# Patient Record
Sex: Male | Born: 1945 | Race: White | Hispanic: No | Smoking: Former smoker
Health system: Southern US, Community
[De-identification: ages and names within clinical notes are randomized; demographics above are authoritative.]

## PROBLEM LIST (undated history)

## (undated) ENCOUNTER — Emergency Department (HOSPITAL_COMMUNITY): Admission: EM | Payer: Medicare Other | Source: Home / Self Care

## (undated) DIAGNOSIS — I2510 Atherosclerotic heart disease of native coronary artery without angina pectoris: Secondary | ICD-10-CM

## (undated) DIAGNOSIS — I509 Heart failure, unspecified: Secondary | ICD-10-CM

## (undated) HISTORY — PX: NOSE SURGERY: SHX723

## (undated) HISTORY — PX: CARDIAC CATHETERIZATION: SHX172

---

## 2019-09-18 ENCOUNTER — Emergency Department (HOSPITAL_COMMUNITY): Payer: Medicare Other

## 2019-09-18 ENCOUNTER — Inpatient Hospital Stay (HOSPITAL_COMMUNITY)
Admission: EM | Admit: 2019-09-18 | Discharge: 2019-10-01 | DRG: 215 | Disposition: A | Discharge status: Home / Self Care | Payer: Medicare Other | Attending: Cardiothoracic Surgery | Admitting: Cardiothoracic Surgery

## 2019-09-18 ENCOUNTER — Encounter (HOSPITAL_COMMUNITY)
Admission: EM | Disposition: A | Discharge status: Home / Self Care | Payer: Self-pay | Source: Home / Self Care | Attending: Cardiothoracic Surgery

## 2019-09-18 ENCOUNTER — Ambulatory Visit (INDEPENDENT_AMBULATORY_CARE_PROVIDER_SITE_OTHER)
Admission: EM | Admit: 2019-09-18 | Discharge: 2019-09-18 | Disposition: A | Discharge status: Home / Self Care | Payer: Medicare Other | Source: Home / Self Care

## 2019-09-18 ENCOUNTER — Other Ambulatory Visit: Payer: Self-pay

## 2019-09-18 ENCOUNTER — Encounter (HOSPITAL_COMMUNITY): Payer: Self-pay | Admitting: Emergency Medicine

## 2019-09-18 ENCOUNTER — Inpatient Hospital Stay (HOSPITAL_COMMUNITY): Payer: Medicare Other

## 2019-09-18 ENCOUNTER — Ambulatory Visit (INDEPENDENT_AMBULATORY_CARE_PROVIDER_SITE_OTHER): Payer: Medicare Other

## 2019-09-18 DIAGNOSIS — R0602 Shortness of breath: Secondary | ICD-10-CM

## 2019-09-18 DIAGNOSIS — R42 Dizziness and giddiness: Secondary | ICD-10-CM | POA: Diagnosis not present

## 2019-09-18 DIAGNOSIS — Z809 Family history of malignant neoplasm, unspecified: Secondary | ICD-10-CM | POA: Diagnosis not present

## 2019-09-18 DIAGNOSIS — R0689 Other abnormalities of breathing: Secondary | ICD-10-CM | POA: Diagnosis not present

## 2019-09-18 DIAGNOSIS — Z20822 Contact with and (suspected) exposure to covid-19: Secondary | ICD-10-CM | POA: Diagnosis present

## 2019-09-18 DIAGNOSIS — Z95811 Presence of heart assist device: Secondary | ICD-10-CM | POA: Diagnosis not present

## 2019-09-18 DIAGNOSIS — Q250 Patent ductus arteriosus: Secondary | ICD-10-CM | POA: Diagnosis not present

## 2019-09-18 DIAGNOSIS — I4891 Unspecified atrial fibrillation: Secondary | ICD-10-CM | POA: Diagnosis not present

## 2019-09-18 DIAGNOSIS — R9431 Abnormal electrocardiogram [ECG] [EKG]: Secondary | ICD-10-CM

## 2019-09-18 DIAGNOSIS — Z8249 Family history of ischemic heart disease and other diseases of the circulatory system: Secondary | ICD-10-CM

## 2019-09-18 DIAGNOSIS — J9811 Atelectasis: Secondary | ICD-10-CM | POA: Diagnosis present

## 2019-09-18 DIAGNOSIS — I083 Combined rheumatic disorders of mitral, aortic and tricuspid valves: Secondary | ICD-10-CM | POA: Diagnosis not present

## 2019-09-18 DIAGNOSIS — D62 Acute posthemorrhagic anemia: Secondary | ICD-10-CM | POA: Diagnosis not present

## 2019-09-18 DIAGNOSIS — R197 Diarrhea, unspecified: Secondary | ICD-10-CM | POA: Diagnosis not present

## 2019-09-18 DIAGNOSIS — Z880 Allergy status to penicillin: Secondary | ICD-10-CM | POA: Diagnosis not present

## 2019-09-18 DIAGNOSIS — Q211 Atrial septal defect: Secondary | ICD-10-CM | POA: Diagnosis not present

## 2019-09-18 DIAGNOSIS — E871 Hypo-osmolality and hyponatremia: Secondary | ICD-10-CM | POA: Diagnosis present

## 2019-09-18 DIAGNOSIS — I509 Heart failure, unspecified: Secondary | ICD-10-CM

## 2019-09-18 DIAGNOSIS — K5900 Constipation, unspecified: Secondary | ICD-10-CM | POA: Diagnosis present

## 2019-09-18 DIAGNOSIS — I5021 Acute systolic (congestive) heart failure: Secondary | ICD-10-CM

## 2019-09-18 DIAGNOSIS — Z9889 Other specified postprocedural states: Secondary | ICD-10-CM

## 2019-09-18 DIAGNOSIS — I6381 Other cerebral infarction due to occlusion or stenosis of small artery: Secondary | ICD-10-CM | POA: Diagnosis not present

## 2019-09-18 DIAGNOSIS — I2510 Atherosclerotic heart disease of native coronary artery without angina pectoris: Secondary | ICD-10-CM | POA: Diagnosis not present

## 2019-09-18 DIAGNOSIS — I420 Dilated cardiomyopathy: Secondary | ICD-10-CM | POA: Diagnosis present

## 2019-09-18 DIAGNOSIS — Z87891 Personal history of nicotine dependence: Secondary | ICD-10-CM

## 2019-09-18 DIAGNOSIS — I214 Non-ST elevation (NSTEMI) myocardial infarction: Principal | ICD-10-CM | POA: Diagnosis present

## 2019-09-18 DIAGNOSIS — T8383XA Hemorrhage of genitourinary prosthetic devices, implants and grafts, initial encounter: Secondary | ICD-10-CM | POA: Diagnosis not present

## 2019-09-18 DIAGNOSIS — Y846 Urinary catheterization as the cause of abnormal reaction of the patient, or of later complication, without mention of misadventure at the time of the procedure: Secondary | ICD-10-CM | POA: Diagnosis not present

## 2019-09-18 DIAGNOSIS — R570 Cardiogenic shock: Secondary | ICD-10-CM

## 2019-09-18 DIAGNOSIS — I25110 Atherosclerotic heart disease of native coronary artery with unstable angina pectoris: Secondary | ICD-10-CM | POA: Diagnosis present

## 2019-09-18 DIAGNOSIS — I255 Ischemic cardiomyopathy: Secondary | ICD-10-CM | POA: Diagnosis present

## 2019-09-18 DIAGNOSIS — J90 Pleural effusion, not elsewhere classified: Secondary | ICD-10-CM

## 2019-09-18 DIAGNOSIS — J9601 Acute respiratory failure with hypoxia: Secondary | ICD-10-CM | POA: Diagnosis present

## 2019-09-18 DIAGNOSIS — I2729 Other secondary pulmonary hypertension: Secondary | ICD-10-CM | POA: Diagnosis present

## 2019-09-18 DIAGNOSIS — R000 Tachycardia, unspecified: Secondary | ICD-10-CM | POA: Diagnosis not present

## 2019-09-18 DIAGNOSIS — Z882 Allergy status to sulfonamides status: Secondary | ICD-10-CM | POA: Diagnosis not present

## 2019-09-18 DIAGNOSIS — Z978 Presence of other specified devices: Secondary | ICD-10-CM

## 2019-09-18 DIAGNOSIS — I639 Cerebral infarction, unspecified: Secondary | ICD-10-CM | POA: Diagnosis not present

## 2019-09-18 DIAGNOSIS — R7989 Other specified abnormal findings of blood chemistry: Secondary | ICD-10-CM | POA: Diagnosis not present

## 2019-09-18 DIAGNOSIS — J810 Acute pulmonary edema: Secondary | ICD-10-CM

## 2019-09-18 DIAGNOSIS — Z4682 Encounter for fitting and adjustment of non-vascular catheter: Secondary | ICD-10-CM | POA: Diagnosis not present

## 2019-09-18 DIAGNOSIS — R0902 Hypoxemia: Secondary | ICD-10-CM | POA: Diagnosis not present

## 2019-09-18 DIAGNOSIS — R778 Other specified abnormalities of plasma proteins: Secondary | ICD-10-CM

## 2019-09-18 DIAGNOSIS — Z951 Presence of aortocoronary bypass graft: Secondary | ICD-10-CM

## 2019-09-18 DIAGNOSIS — Z743 Need for continuous supervision: Secondary | ICD-10-CM | POA: Diagnosis not present

## 2019-09-18 DIAGNOSIS — I10 Essential (primary) hypertension: Secondary | ICD-10-CM | POA: Diagnosis not present

## 2019-09-18 DIAGNOSIS — R600 Localized edema: Secondary | ICD-10-CM | POA: Diagnosis not present

## 2019-09-18 HISTORY — PX: ARTERIAL LINE INSERTION: CATH118227

## 2019-09-18 HISTORY — PX: VENTRICULAR ASSIST DEVICE INSERTION: CATH118273

## 2019-09-18 HISTORY — PX: RIGHT/LEFT HEART CATH AND CORONARY ANGIOGRAPHY: CATH118266

## 2019-09-18 LAB — POCT I-STAT 7, (LYTES, BLD GAS, ICA,H+H)
Acid-base deficit: 5 mmol/L — ABNORMAL HIGH (ref 0.0–2.0)
Acid-base deficit: 6 mmol/L — ABNORMAL HIGH (ref 0.0–2.0)
Bicarbonate: 17.5 mmol/L — ABNORMAL LOW (ref 20.0–28.0)
Bicarbonate: 18.1 mmol/L — ABNORMAL LOW (ref 20.0–28.0)
Calcium, Ion: 1.23 mmol/L (ref 1.15–1.40)
Calcium, Ion: 1.14 mmol/L — ABNORMAL LOW (ref 1.15–1.40)
HCT: 43 % (ref 39.0–52.0)
HCT: 42 % (ref 39.0–52.0)
Hemoglobin: 14.6 g/dL (ref 13.0–17.0)
Hemoglobin: 14.3 g/dL (ref 13.0–17.0)
O2 Saturation: 92 %
O2 Saturation: 100 %
Potassium: 4.5 mmol/L (ref 3.5–5.1)
Potassium: 4.1 mmol/L (ref 3.5–5.1)
Sodium: 133 mmol/L — ABNORMAL LOW (ref 135–145)
Sodium: 135 mmol/L (ref 135–145)
TCO2: 18 mmol/L — ABNORMAL LOW (ref 22–32)
TCO2: 19 mmol/L — ABNORMAL LOW (ref 22–32)
pCO2 arterial: 26.8 mmHg — ABNORMAL LOW (ref 32.0–48.0)
pCO2 arterial: 31.8 mmHg — ABNORMAL LOW (ref 32.0–48.0)
pH, Arterial: 7.424 (ref 7.350–7.450)
pH, Arterial: 7.364 (ref 7.350–7.450)
pO2, Arterial: 61 mmHg — ABNORMAL LOW (ref 83.0–108.0)
pO2, Arterial: 286 mmHg — ABNORMAL HIGH (ref 83.0–108.0)

## 2019-09-18 LAB — COMPREHENSIVE METABOLIC PANEL
ALT: 31 U/L (ref 0–44)
ALT: 41 U/L (ref 0–44)
AST: 37 U/L (ref 15–41)
AST: 74 U/L — ABNORMAL HIGH (ref 15–41)
Albumin: 4.3 g/dL (ref 3.5–5.0)
Albumin: 3.9 g/dL (ref 3.5–5.0)
Alkaline Phosphatase: 76 U/L (ref 38–126)
Alkaline Phosphatase: 71 U/L (ref 38–126)
Anion gap: 14 (ref 5–15)
Anion gap: 11 (ref 5–15)
BUN: 33 mg/dL — ABNORMAL HIGH (ref 8–23)
BUN: 34 mg/dL — ABNORMAL HIGH (ref 8–23)
CO2: 17 mmol/L — ABNORMAL LOW (ref 22–32)
CO2: 21 mmol/L — ABNORMAL LOW (ref 22–32)
Calcium: 9.2 mg/dL (ref 8.9–10.3)
Calcium: 8.7 mg/dL — ABNORMAL LOW (ref 8.9–10.3)
Chloride: 101 mmol/L (ref 98–111)
Chloride: 102 mmol/L (ref 98–111)
Creatinine, Ser: 1.06 mg/dL (ref 0.61–1.24)
Creatinine, Ser: 1.16 mg/dL (ref 0.61–1.24)
GFR calc Af Amer: >60 mL/min (ref >60)
GFR calc Af Amer: >60 mL/min (ref >60)
GFR calc non Af Amer: >60 mL/min (ref >60)
GFR calc non Af Amer: >60 mL/min (ref >60)
Glucose, Bld: 131 mg/dL — ABNORMAL HIGH (ref 70–99)
Glucose, Bld: 130 mg/dL — ABNORMAL HIGH (ref 70–99)
Potassium: 4.4 mmol/L (ref 3.5–5.1)
Potassium: 4.3 mmol/L (ref 3.5–5.1)
Sodium: 132 mmol/L — ABNORMAL LOW (ref 135–145)
Sodium: 134 mmol/L — ABNORMAL LOW (ref 135–145)
Total Bilirubin: 1.3 mg/dL — ABNORMAL HIGH (ref 0.3–1.2)
Total Bilirubin: 2 mg/dL — ABNORMAL HIGH (ref 0.3–1.2)
Total Protein: 6.7 g/dL (ref 6.5–8.1)
Total Protein: 6.2 g/dL — ABNORMAL LOW (ref 6.5–8.1)

## 2019-09-18 LAB — CBC
HCT: 39.3 % (ref 39.0–52.0)
Hemoglobin: 13.2 g/dL (ref 13.0–17.0)
MCH: 31.1 pg (ref 26.0–34.0)
MCHC: 33.6 g/dL (ref 30.0–36.0)
MCV: 92.5 fL (ref 80.0–100.0)
Platelets: 155 10*3/uL (ref 150–400)
RBC: 4.25 MIL/uL (ref 4.22–5.81)
RDW: 13.5 % (ref 11.5–15.5)
WBC: 8.2 10*3/uL (ref 4.0–10.5)
nRBC: 0 % (ref 0.0–0.2)

## 2019-09-18 LAB — TROPONIN I (HIGH SENSITIVITY)
Troponin I (High Sensitivity): 612 ng/L (ref <18)
Troponin I (High Sensitivity): 641 ng/L (ref <18)

## 2019-09-18 LAB — MAGNESIUM: Magnesium: 1.9 mg/dL (ref 1.7–2.4)

## 2019-09-18 LAB — POCT I-STAT EG7
Acid-base deficit: 5 mmol/L — ABNORMAL HIGH (ref 0.0–2.0)
Acid-base deficit: 6 mmol/L — ABNORMAL HIGH (ref 0.0–2.0)
Bicarbonate: 19.5 mmol/L — ABNORMAL LOW (ref 20.0–28.0)
Bicarbonate: 21 mmol/L (ref 20.0–28.0)
Calcium, Ion: 1.12 mmol/L — ABNORMAL LOW (ref 1.15–1.40)
Calcium, Ion: 1.2 mmol/L (ref 1.15–1.40)
HCT: 42 % (ref 39.0–52.0)
HCT: 43 % (ref 39.0–52.0)
Hemoglobin: 14.3 g/dL (ref 13.0–17.0)
Hemoglobin: 14.6 g/dL (ref 13.0–17.0)
O2 Saturation: 61 %
O2 Saturation: 63 %
Potassium: 4 mmol/L (ref 3.5–5.1)
Potassium: 4.2 mmol/L (ref 3.5–5.1)
Sodium: 134 mmol/L — ABNORMAL LOW (ref 135–145)
Sodium: 134 mmol/L — ABNORMAL LOW (ref 135–145)
TCO2: 21 mmol/L — ABNORMAL LOW (ref 22–32)
TCO2: 22 mmol/L (ref 22–32)
pCO2, Ven: 37.5 mmHg — ABNORMAL LOW (ref 44.0–60.0)
pCO2, Ven: 39.3 mmHg — ABNORMAL LOW (ref 44.0–60.0)
pH, Ven: 7.324 (ref 7.250–7.430)
pH, Ven: 7.335 (ref 7.250–7.430)
pO2, Ven: 34 mmHg (ref 32.0–45.0)
pO2, Ven: 35 mmHg (ref 32.0–45.0)

## 2019-09-18 LAB — URINALYSIS, ROUTINE W REFLEX MICROSCOPIC
Bilirubin Urine: NEGATIVE
Glucose, UA: NEGATIVE mg/dL
Ketones, ur: NEGATIVE mg/dL
Leukocytes,Ua: NEGATIVE
Nitrite: NEGATIVE
Protein, ur: 100 mg/dL — AB
Specific Gravity, Urine: 1.025 (ref 1.005–1.030)
pH: 6 (ref 5.0–8.0)

## 2019-09-18 LAB — RESPIRATORY PANEL BY RT PCR (FLU A&B, COVID)
Influenza A by PCR: NEGATIVE
Influenza B by PCR: NEGATIVE
SARS Coronavirus 2 by RT PCR: NEGATIVE

## 2019-09-18 LAB — SARS CORONAVIRUS 2 (TAT 6-24 HRS): SARS Coronavirus 2: NEGATIVE

## 2019-09-18 LAB — CBC WITH DIFFERENTIAL/PLATELET
Abs Immature Granulocytes: 0.04 10*3/uL (ref 0.00–0.07)
Basophils Absolute: 0 10*3/uL (ref 0.0–0.1)
Basophils Relative: 0 %
Eosinophils Absolute: 0 10*3/uL (ref 0.0–0.5)
Eosinophils Relative: 0 %
HCT: 46 % (ref 39.0–52.0)
Hemoglobin: 15.2 g/dL (ref 13.0–17.0)
Immature Granulocytes: 0 %
Lymphocytes Relative: 21 %
Lymphs Abs: 2.2 10*3/uL (ref 0.7–4.0)
MCH: 30.8 pg (ref 26.0–34.0)
MCHC: 33 g/dL (ref 30.0–36.0)
MCV: 93.3 fL (ref 80.0–100.0)
Monocytes Absolute: 0.9 10*3/uL (ref 0.1–1.0)
Monocytes Relative: 8 %
Neutro Abs: 7.5 10*3/uL (ref 1.7–7.7)
Neutrophils Relative %: 71 %
Platelets: 179 10*3/uL (ref 150–400)
RBC: 4.93 MIL/uL (ref 4.22–5.81)
RDW: 13.4 % (ref 11.5–15.5)
WBC: 10.7 10*3/uL — ABNORMAL HIGH (ref 4.0–10.5)
nRBC: 0 % (ref 0.0–0.2)

## 2019-09-18 LAB — HEMOGLOBIN A1C
Hgb A1c MFr Bld: 4.9 % (ref 4.8–5.6)
Mean Plasma Glucose: 93.93 mg/dL

## 2019-09-18 LAB — LACTIC ACID, PLASMA: Lactic Acid, Venous: 1.2 mmol/L (ref 0.5–1.9)

## 2019-09-18 LAB — LACTATE DEHYDROGENASE: LDH: 345 U/L — ABNORMAL HIGH (ref 98–192)

## 2019-09-18 LAB — TSH: TSH: 7.235 u[IU]/mL — ABNORMAL HIGH (ref 0.350–4.500)

## 2019-09-18 LAB — POCT ACTIVATED CLOTTING TIME
Activated Clotting Time: 197 seconds
Activated Clotting Time: 125 seconds

## 2019-09-18 LAB — ABO/RH: ABO/RH(D): A POS

## 2019-09-18 LAB — D-DIMER, QUANTITATIVE: D-Dimer, Quant: 0.45 ug/mL-FEU (ref 0.00–0.50)

## 2019-09-18 LAB — BRAIN NATRIURETIC PEPTIDE: B Natriuretic Peptide: 1784.5 pg/mL — ABNORMAL HIGH (ref 0.0–100.0)

## 2019-09-18 LAB — MRSA PCR SCREENING: MRSA by PCR: NEGATIVE

## 2019-09-18 LAB — POC SARS CORONAVIRUS 2 AG -  ED: SARS Coronavirus 2 Ag: NEGATIVE

## 2019-09-18 LAB — ECHOCARDIOGRAM COMPLETE

## 2019-09-18 SURGERY — RIGHT/LEFT HEART CATH AND CORONARY ANGIOGRAPHY
Anesthesia: LOCAL

## 2019-09-18 MED ORDER — TRANEXAMIC ACID (OHS) PUMP PRIME SOLUTION
2.0000 mg/kg | INTRAVENOUS | Status: DC
  Filled 2019-09-18: qty 1.4

## 2019-09-18 MED ORDER — LEVOFLOXACIN IN D5W 500 MG/100ML IV SOLN
500.0000 mg | INTRAVENOUS | Status: DC
  Filled 2019-09-18 (×2): qty 100

## 2019-09-18 MED ORDER — PLASMA-LYTE 148 IV SOLN
INTRAVENOUS | Status: DC
  Filled 2019-09-18: qty 2.5

## 2019-09-18 MED ORDER — HEPARIN (PORCINE) IN NACL 1000-0.9 UT/500ML-% IV SOLN
INTRAVENOUS | Status: DC | PRN
  Administered 2019-09-18 (×3): 500 mL

## 2019-09-18 MED ORDER — VERAPAMIL HCL 2.5 MG/ML IV SOLN
INTRAVENOUS | Status: AC
  Filled 2019-09-18: qty 2

## 2019-09-18 MED ORDER — MANNITOL 20 % IV SOLN
Freq: Once | INTRAVENOUS | Status: DC
  Filled 2019-09-18: qty 13

## 2019-09-18 MED ORDER — SODIUM CHLORIDE 0.9% FLUSH
3.0000 mL | Freq: Two times a day (BID) | INTRAVENOUS | Status: DC
  Administered 2019-09-18 – 2019-09-22 (×4): 3 mL via INTRAVENOUS

## 2019-09-18 MED ORDER — SODIUM CHLORIDE 0.9% FLUSH: 3.0000 mL | INTRAVENOUS | Status: DC | PRN

## 2019-09-18 MED ORDER — EPINEPHRINE HCL 5 MG/250ML IV SOLN IN NS
0.0000 ug/min | INTRAVENOUS | Status: AC
  Administered 2019-09-19: 2 ug/min via INTRAVENOUS
  Filled 2019-09-18: qty 250

## 2019-09-18 MED ORDER — POTASSIUM CHLORIDE 2 MEQ/ML IV SOLN
80.0000 meq | INTRAVENOUS | Status: DC
  Filled 2019-09-18: qty 40

## 2019-09-18 MED ORDER — MAGNESIUM SULFATE 50 % IJ SOLN
40.0000 meq | INTRAMUSCULAR | Status: DC
  Filled 2019-09-18: qty 9.85

## 2019-09-18 MED ORDER — HYDRALAZINE HCL 20 MG/ML IJ SOLN
10.0000 mg | Freq: Once | INTRAMUSCULAR | Status: DC
  Filled 2019-09-18: qty 1

## 2019-09-18 MED ORDER — SPIRONOLACTONE 12.5 MG HALF TABLET
12.5000 mg | ORAL_TABLET | Freq: Every day | ORAL | Status: DC
  Administered 2019-09-20 – 2019-09-28 (×9): 12.5 mg via ORAL
  Filled 2019-09-18 (×10): qty 1

## 2019-09-18 MED ORDER — ALBUTEROL SULFATE HFA 108 (90 BASE) MCG/ACT IN AERS
2.0000 | INHALATION_SPRAY | Freq: Once | RESPIRATORY_TRACT | Status: AC
  Administered 2019-09-18: 2 via RESPIRATORY_TRACT
  Filled 2019-09-18: qty 6.7

## 2019-09-18 MED ORDER — VERAPAMIL HCL 2.5 MG/ML IV SOLN
INTRAVENOUS | Status: DC | PRN
  Administered 2019-09-18: 10 mL via INTRA_ARTERIAL

## 2019-09-18 MED ORDER — EPINEPHRINE HCL 5 MG/250ML IV SOLN IN NS
0.0000 ug/min | INTRAVENOUS | Status: DC
  Filled 2019-09-18 (×2): qty 250

## 2019-09-18 MED ORDER — SODIUM CHLORIDE 0.9 % IV SOLN: 250.0000 mL | INTRAVENOUS | Status: DC | PRN

## 2019-09-18 MED ORDER — FUROSEMIDE 10 MG/ML IJ SOLN
80.0000 mg | Freq: Two times a day (BID) | INTRAMUSCULAR | Status: DC
  Administered 2019-09-18 – 2019-09-20 (×3): 80 mg via INTRAVENOUS
  Filled 2019-09-18 (×3): qty 8

## 2019-09-18 MED ORDER — CHLORHEXIDINE GLUCONATE CLOTH 2 % EX PADS
6.0000 | MEDICATED_PAD | Freq: Once | CUTANEOUS | Status: AC
  Administered 2019-09-18: 6 via TOPICAL

## 2019-09-18 MED ORDER — NITROGLYCERIN IN D5W 200-5 MCG/ML-% IV SOLN
2.0000 ug/min | INTRAVENOUS | Status: DC
  Filled 2019-09-18: qty 250

## 2019-09-18 MED ORDER — TRANEXAMIC ACID (OHS) BOLUS VIA INFUSION
15.0000 mg/kg | INTRAVENOUS | Status: AC
  Administered 2019-09-19: 1050 mg via INTRAVENOUS
  Filled 2019-09-18: qty 1050

## 2019-09-18 MED ORDER — ACETAMINOPHEN 325 MG PO TABS: 650.0000 mg | ORAL_TABLET | ORAL | Status: DC | PRN

## 2019-09-18 MED ORDER — BISACODYL 5 MG PO TBEC: 5.0000 mg | DELAYED_RELEASE_TABLET | Freq: Once | ORAL | Status: DC

## 2019-09-18 MED ORDER — FENTANYL CITRATE (PF) 100 MCG/2ML IJ SOLN
INTRAMUSCULAR | Status: DC | PRN
  Administered 2019-09-18 (×3): 25 ug via INTRAVENOUS

## 2019-09-18 MED ORDER — ZOLPIDEM TARTRATE 5 MG PO TABS
5.0000 mg | ORAL_TABLET | Freq: Every evening | ORAL | Status: DC | PRN
  Administered 2019-09-26 – 2019-09-30 (×5): 5 mg via ORAL
  Filled 2019-09-18 (×5): qty 1

## 2019-09-18 MED ORDER — MILRINONE LACTATE IN DEXTROSE 20-5 MG/100ML-% IV SOLN
0.1250 ug/kg/min | INTRAVENOUS | Status: AC
  Administered 2019-09-18: 0.25 ug/kg/min via INTRAVENOUS
  Filled 2019-09-18: qty 100

## 2019-09-18 MED ORDER — MIDAZOLAM HCL 2 MG/2ML IJ SOLN
INTRAMUSCULAR | Status: DC | PRN
  Administered 2019-09-18 (×2): 1 mg via INTRAVENOUS

## 2019-09-18 MED ORDER — SODIUM CHLORIDE 0.9 % IV SOLN
INTRAVENOUS | Status: DC
  Filled 2019-09-18: qty 30

## 2019-09-18 MED ORDER — NOREPINEPHRINE BITARTRATE 1 MG/ML IV SOLN
INTRAVENOUS | Status: DC | PRN
  Administered 2019-09-18: 5 ug/min via INTRAVENOUS

## 2019-09-18 MED ORDER — TRANEXAMIC ACID 1000 MG/10ML IV SOLN
1.5000 mg/kg/h | INTRAVENOUS | Status: DC
  Filled 2019-09-18: qty 25

## 2019-09-18 MED ORDER — HEPARIN SODIUM (PORCINE) 5000 UNIT/ML IJ SOLN
50000.0000 [IU] | INTRAVENOUS | Status: DC
  Filled 2019-09-18: qty 10

## 2019-09-18 MED ORDER — CHLORHEXIDINE GLUCONATE CLOTH 2 % EX PADS
6.0000 | MEDICATED_PAD | Freq: Once | CUTANEOUS | Status: AC
  Administered 2019-09-19: 6 via TOPICAL

## 2019-09-18 MED ORDER — FUROSEMIDE 10 MG/ML IJ SOLN
40.0000 mg | Freq: Once | INTRAMUSCULAR | Status: AC
  Administered 2019-09-18: 40 mg via INTRAVENOUS
  Filled 2019-09-18: qty 4

## 2019-09-18 MED ORDER — HEPARIN (PORCINE) 25000 UT/250ML-% IV SOLN: 200.0000 [IU]/h | INTRAVENOUS | Status: DC

## 2019-09-18 MED ORDER — DEXMEDETOMIDINE HCL IN NACL 400 MCG/100ML IV SOLN
0.1000 ug/kg/h | INTRAVENOUS | Status: DC
  Filled 2019-09-18 (×2): qty 100

## 2019-09-18 MED ORDER — HEPARIN (PORCINE) 25000 UT/250ML-% IV SOLN
200.0000 [IU]/h | INTRAVENOUS | Status: DC
  Filled 2019-09-18: qty 250

## 2019-09-18 MED ORDER — TRANEXAMIC ACID 1000 MG/10ML IV SOLN
1.5000 mg/kg/h | INTRAVENOUS | Status: AC
  Administered 2019-09-19: 1.5 mg/kg/h via INTRAVENOUS
  Filled 2019-09-18: qty 25

## 2019-09-18 MED ORDER — FUROSEMIDE 10 MG/ML IJ SOLN: 40.0000 mg | Freq: Two times a day (BID) | INTRAMUSCULAR | Status: DC

## 2019-09-18 MED ORDER — CHLORHEXIDINE GLUCONATE 0.12 % MT SOLN
15.0000 mL | Freq: Once | OROMUCOSAL | Status: AC
  Administered 2019-09-19: 15 mL via OROMUCOSAL
  Filled 2019-09-18: qty 15

## 2019-09-18 MED ORDER — INSULIN REGULAR(HUMAN) IN NACL 100-0.9 UT/100ML-% IV SOLN
INTRAVENOUS | Status: AC
  Administered 2019-09-19: 1.6 [IU]/h via INTRAVENOUS
  Filled 2019-09-18: qty 100

## 2019-09-18 MED ORDER — VANCOMYCIN HCL 1250 MG/250ML IV SOLN
1250.0000 mg | INTRAVENOUS | Status: AC
  Administered 2019-09-19: 1250 mg via INTRAVENOUS
  Filled 2019-09-18: qty 250

## 2019-09-18 MED ORDER — HYDRALAZINE HCL 20 MG/ML IJ SOLN: 10.0000 mg | INTRAMUSCULAR | Status: DC | PRN

## 2019-09-18 MED ORDER — MILRINONE LACTATE IN DEXTROSE 20-5 MG/100ML-% IV SOLN
0.3000 ug/kg/min | INTRAVENOUS | Status: AC
  Administered 2019-09-19: .375 ug/kg/min via INTRAVENOUS
  Filled 2019-09-18: qty 100

## 2019-09-18 MED ORDER — HEPARIN (PORCINE) IN NACL 1000-0.9 UT/500ML-% IV SOLN
INTRAVENOUS | Status: AC
  Filled 2019-09-18: qty 500

## 2019-09-18 MED ORDER — HEPARIN (PORCINE) IN NACL 1000-0.9 UT/500ML-% IV SOLN
INTRAVENOUS | Status: AC
  Filled 2019-09-18: qty 1000

## 2019-09-18 MED ORDER — NOREPINEPHRINE 4 MG/250ML-% IV SOLN
0.0000 ug/min | INTRAVENOUS | Status: AC
  Administered 2019-09-19: 2 ug/min via INTRAVENOUS
  Filled 2019-09-18: qty 250

## 2019-09-18 MED ORDER — HEPARIN SODIUM (PORCINE) 5000 UNIT/ML IJ SOLN: 5000.0000 [IU] | Freq: Three times a day (TID) | INTRAMUSCULAR | Status: DC

## 2019-09-18 MED ORDER — HEPARIN SODIUM (PORCINE) 1000 UNIT/ML IJ SOLN
INTRAMUSCULAR | Status: AC
  Filled 2019-09-18: qty 1

## 2019-09-18 MED ORDER — ALPRAZOLAM 0.25 MG PO TABS
0.2500 mg | ORAL_TABLET | Freq: Two times a day (BID) | ORAL | Status: DC | PRN
  Administered 2019-09-22 – 2019-09-24 (×3): 0.25 mg via ORAL
  Filled 2019-09-18 (×3): qty 1

## 2019-09-18 MED ORDER — MILRINONE LACTATE IN DEXTROSE 20-5 MG/100ML-% IV SOLN
0.3000 ug/kg/min | INTRAVENOUS | Status: DC
  Filled 2019-09-18 (×2): qty 100

## 2019-09-18 MED ORDER — INSULIN REGULAR(HUMAN) IN NACL 100-0.9 UT/100ML-% IV SOLN
INTRAVENOUS | Status: DC
  Filled 2019-09-18 (×2): qty 100

## 2019-09-18 MED ORDER — ONDANSETRON HCL 4 MG/2ML IJ SOLN: 4.0000 mg | Freq: Four times a day (QID) | INTRAMUSCULAR | Status: DC | PRN

## 2019-09-18 MED ORDER — LEVOFLOXACIN IN D5W 500 MG/100ML IV SOLN
500.0000 mg | INTRAVENOUS | Status: AC
  Administered 2019-09-19: 500 mg via INTRAVENOUS
  Filled 2019-09-18: qty 100

## 2019-09-18 MED ORDER — MILRINONE LACTATE IN DEXTROSE 20-5 MG/100ML-% IV SOLN
0.3750 ug/kg/min | INTRAVENOUS | Status: DC
  Administered 2019-09-18: 0.25 ug/kg/min via INTRAVENOUS
  Administered 2019-09-19: 0.375 ug/kg/min via INTRAVENOUS
  Filled 2019-09-18: qty 100

## 2019-09-18 MED ORDER — TRANEXAMIC ACID (OHS) BOLUS VIA INFUSION
15.0000 mg/kg | INTRAVENOUS | Status: DC
  Filled 2019-09-18: qty 1050

## 2019-09-18 MED ORDER — LABETALOL HCL 5 MG/ML IV SOLN: 10.0000 mg | INTRAVENOUS | Status: DC | PRN

## 2019-09-18 MED ORDER — IOHEXOL 350 MG/ML SOLN
INTRAVENOUS | Status: DC | PRN
  Administered 2019-09-18: 70 mL

## 2019-09-18 MED ORDER — SODIUM CHLORIDE 0.9% FLUSH
3.0000 mL | Freq: Two times a day (BID) | INTRAVENOUS | Status: DC
  Administered 2019-09-18 – 2019-09-22 (×4): 3 mL via INTRAVENOUS

## 2019-09-18 MED ORDER — SODIUM CHLORIDE 0.9% FLUSH
3.0000 mL | Freq: Two times a day (BID) | INTRAVENOUS | Status: DC
  Administered 2019-09-18 – 2019-09-25 (×9): 3 mL via INTRAVENOUS

## 2019-09-18 MED ORDER — PHENYLEPHRINE HCL-NACL 20-0.9 MG/250ML-% IV SOLN
30.0000 ug/min | INTRAVENOUS | Status: DC
  Filled 2019-09-18: qty 250

## 2019-09-18 MED ORDER — HEPARIN SODIUM (PORCINE) 5000 UNIT/ML IJ SOLN
50000.0000 [IU] | INTRAVENOUS | Status: DC
  Administered 2019-09-18: 50000 [IU]
  Filled 2019-09-18: qty 10

## 2019-09-18 MED ORDER — SACUBITRIL-VALSARTAN 24-26 MG PO TABS
1.0000 | ORAL_TABLET | Freq: Two times a day (BID) | ORAL | Status: DC
  Filled 2019-09-18: qty 1

## 2019-09-18 MED ORDER — NOREPINEPHRINE 4 MG/250ML-% IV SOLN
0.0000 ug/min | INTRAVENOUS | Status: DC
  Filled 2019-09-18: qty 250

## 2019-09-18 MED ORDER — HEPARIN SODIUM (PORCINE) 1000 UNIT/ML IJ SOLN
INTRAMUSCULAR | Status: DC | PRN
  Administered 2019-09-18: 4000 [IU] via INTRAVENOUS
  Administered 2019-09-18: 3500 [IU] via INTRAVENOUS

## 2019-09-18 MED ORDER — DIGOXIN 125 MCG PO TABS
0.1250 mg | ORAL_TABLET | Freq: Every day | ORAL | Status: DC
  Administered 2019-09-20 – 2019-10-01 (×12): 0.125 mg via ORAL
  Filled 2019-09-18 (×13): qty 1

## 2019-09-18 MED ORDER — NOREPINEPHRINE 4 MG/250ML-% IV SOLN
INTRAVENOUS | Status: AC
  Filled 2019-09-18: qty 250

## 2019-09-18 MED ORDER — ACETAMINOPHEN 325 MG PO TABS
650.0000 mg | ORAL_TABLET | ORAL | Status: DC | PRN
  Administered 2019-09-24 – 2019-09-27 (×3): 650 mg via ORAL
  Filled 2019-09-18 (×3): qty 2

## 2019-09-18 MED ORDER — METOPROLOL TARTRATE 12.5 MG HALF TABLET
12.5000 mg | ORAL_TABLET | Freq: Once | ORAL | Status: AC
  Administered 2019-09-19: 12.5 mg via ORAL
  Filled 2019-09-18: qty 1

## 2019-09-18 MED ORDER — POTASSIUM CHLORIDE CRYS ER 20 MEQ PO TBCR
40.0000 meq | EXTENDED_RELEASE_TABLET | Freq: Two times a day (BID) | ORAL | Status: DC
  Filled 2019-09-18: qty 2

## 2019-09-18 MED ORDER — VANCOMYCIN HCL 1250 MG/250ML IV SOLN
1250.0000 mg | INTRAVENOUS | Status: DC
  Filled 2019-09-18 (×2): qty 250

## 2019-09-18 MED ORDER — DEXMEDETOMIDINE HCL IN NACL 400 MCG/100ML IV SOLN
0.1000 ug/kg/h | INTRAVENOUS | Status: AC
  Administered 2019-09-19: .7 ug/kg/h via INTRAVENOUS
  Filled 2019-09-18: qty 100

## 2019-09-18 MED ORDER — LIDOCAINE HCL (PF) 1 % IJ SOLN
INTRAMUSCULAR | Status: AC
  Filled 2019-09-18: qty 30

## 2019-09-18 MED ORDER — FENTANYL CITRATE (PF) 100 MCG/2ML IJ SOLN
INTRAMUSCULAR | Status: AC
  Filled 2019-09-18: qty 2

## 2019-09-18 MED ORDER — ONDANSETRON HCL 4 MG/2ML IJ SOLN
4.0000 mg | Freq: Four times a day (QID) | INTRAMUSCULAR | Status: DC | PRN
  Administered 2019-09-21 – 2019-09-26 (×5): 4 mg via INTRAVENOUS
  Filled 2019-09-18 (×5): qty 2

## 2019-09-18 MED ORDER — HEPARIN (PORCINE) 25000 UT/250ML-% IV SOLN
450.0000 [IU]/h | INTRAVENOUS | Status: DC
  Administered 2019-09-18: 200 [IU]/h via INTRAVENOUS

## 2019-09-18 MED ORDER — MIDAZOLAM HCL 2 MG/2ML IJ SOLN
INTRAMUSCULAR | Status: AC
  Filled 2019-09-18: qty 2

## 2019-09-18 MED ORDER — TEMAZEPAM 15 MG PO CAPS: 15.0000 mg | ORAL_CAPSULE | Freq: Once | ORAL | Status: DC | PRN

## 2019-09-18 MED ORDER — PHENYLEPHRINE HCL-NACL 20-0.9 MG/250ML-% IV SOLN
30.0000 ug/min | INTRAVENOUS | Status: DC
  Filled 2019-09-18 (×2): qty 250

## 2019-09-18 MED ORDER — LIDOCAINE HCL (PF) 1 % IJ SOLN
INTRAMUSCULAR | Status: DC | PRN
  Administered 2019-09-18: 2 mL
  Administered 2019-09-18: 10 mL
  Administered 2019-09-18: 2 mL

## 2019-09-18 SURGICAL SUPPLY — 17 items
CATH 5FR JL3.5 JR4 ANG PIG MP (CATHETERS) ×2 IMPLANT
CATH SWAN GANZ VIP 7.5F (CATHETERS) ×2 IMPLANT
DEVICE RAD COMP TR BAND LRG (VASCULAR PRODUCTS) ×2 IMPLANT
ELECT DEFIB PAD ADLT CADENCE (PAD) ×4 IMPLANT
FEM STOP ARCH (HEMOSTASIS) ×1
GLIDESHEATH SLEND SS 6F .021 (SHEATH) ×2 IMPLANT
GUIDEWIRE INQWIRE 1.5J.035X260 (WIRE) ×1 IMPLANT
HOVERMATT SINGLE USE (MISCELLANEOUS) ×2 IMPLANT
INQWIRE 1.5J .035X260CM (WIRE) ×2
PACK CARDIAC CATHETERIZATION (CUSTOM PROCEDURE TRAY) ×2 IMPLANT
SET IMPELLA CP PUMP (CATHETERS) ×2 IMPLANT
SHEATH PINNACLE 6F 10CM (SHEATH) ×2 IMPLANT
SHEATH PINNACLE 8F 10CM (SHEATH) ×2 IMPLANT
SLEEVE REPOSITIONING LENGTH 30 (MISCELLANEOUS) ×2 IMPLANT
SYSTEM COMPRESSION FEMOSTOP (HEMOSTASIS) ×1 IMPLANT
TRANSDUCER W/STOPCOCK (MISCELLANEOUS) ×2 IMPLANT
WIRE HI TORQ VERSACORE-J 145CM (WIRE) ×2 IMPLANT

## 2019-09-18 NOTE — ED Triage Notes (Signed)
Pt presents with complaints of shortness of breath that started Monday. States that it is worse when he lays back. Pulse ox 88% on RA, 2L [place] oxygen applied during intake and pulse ox is now 95%. Pt states he got his second covid vaccine mid February.

## 2019-09-18 NOTE — ED Provider Notes (Addendum)
[facility]    CSN: [id number] Arrival date & time: 09/18/19  1028      History   Chief Complaint Chief Complaint  Patient presents with  . Shortness of Breath    HPI Dennis Roberts is a 74 y.o. male without significant medical history, though admittedly without routine health checks, presenting for increased dyspnea since Monday.  Patient states for the last few days he has not been able to sleep as it is worse with laying back.  Patient denies recent illness, fever, chest pain.  States he does have a mild dry cough, this is chronic/stable for him.  Denies history of blood clots, lower extremity edema, prolonged stasis, injury, easy bruising/bleeding.    History reviewed. No pertinent past medical history.  There are no problems to display for this patient.   Past Surgical History:  Procedure Laterality Date  . NOSE SURGERY         Home Medications    Prior to Admission medications   Medication Sig Start Date End Date Taking? Authorizing Provider  lactobacillus acidophilus (BACID) TABS tablet Take 2 tablets by mouth 3 (three) times daily.   Yes [provider]  Multiple Vitamin (MULTIVITAMIN) capsule Take 1 capsule by mouth daily.   Yes [provider]    Family History Family History  Problem Relation Age of Onset  . Healthy Mother   . Cancer Father     Social History Social History   Tobacco Use  . Smoking status: Former Games developer  . Smokeless tobacco: Former Engineer, water Use Topics  . Alcohol use: Yes    Comment: occ  . Drug use: Never     Allergies   Penicillins and Sulfa antibiotics   Review of Systems As per HPI   Physical Exam Triage Vital Signs ED Triage Vitals  Enc Vitals Group     BP      Pulse      Resp      Temp      Temp src      SpO2      Weight      Height      Head Circumference      Peak Flow      Pain Score      Pain Loc      Pain Edu?      Excl. in GC?    No data found.  Updated  Vital Signs BP (!) 144/89   Pulse (!) 120   Temp 98 F (36.7 C)   Resp (!) 28   SpO2 95%   Visual Acuity Right Eye Distance:   Left Eye Distance:   Bilateral Distance:    Right Eye Near:   Left Eye Near:    Bilateral Near:     Physical Exam Constitutional:      General: He is not in acute distress.    Appearance: He is not toxic-appearing or diaphoretic.  HENT:     Head: Normocephalic and atraumatic.     Mouth/Throat:     Mouth: Mucous membranes are moist.     Pharynx: Oropharynx is clear.  Eyes:     General: No scleral icterus.    Conjunctiva/sclera: Conjunctivae normal.     Pupils: Pupils are equal, round, and reactive to light.  Neck:     Trachea: No tracheal deviation.     Comments: Trachea midline, negative JVD Cardiovascular:     Rate and Rhythm: Regular rhythm. Tachycardia present.  Heart sounds: No murmur. No gallop.   Pulmonary:     Effort: Pulmonary effort is normal. Tachypnea present. No accessory muscle usage.     Breath sounds: No stridor. Examination of the right-lower field reveals decreased breath sounds. Examination of the left-lower field reveals decreased breath sounds. Decreased breath sounds present. No wheezing.  Chest:     Chest wall: No deformity, tenderness or crepitus.  Musculoskeletal:     Cervical back: Neck supple. No tenderness.     Right lower leg: Edema present.     Left lower leg: Edema present.     Comments: 3+ pitting to knees  Lymphadenopathy:     Cervical: No cervical adenopathy.  Skin:    Capillary Refill: Capillary refill takes 2 to 3 seconds.     Coloration: Skin is pale. Skin is not jaundiced.     Findings: No rash.  Neurological:     Mental Status: He is alert and oriented to person, place, and time.      UC Treatments / Results  Labs (all labs ordered are listed, but only abnormal results are displayed) Labs Reviewed  POC SARS CORONAVIRUS 2 AG -  ED - Normal    EKG   Radiology DG Chest 2 View  Result  Date: 09/18/2019 CLINICAL DATA:  Shortness of breath. EXAM: CHEST - 2 VIEW COMPARISON:  None. FINDINGS: Mild cardiomegaly is noted. No pneumothorax is noted. Bilateral interstitial densities are noted concerning for pulmonary edema with mild bilateral pleural effusions. Bony thorax is unremarkable. IMPRESSION: Probable bilateral pulmonary edema with mild bilateral pleural effusions. Electronically Signed   By: Marijo Conception M.D.   On: 09/18/2019 11:26    Procedures Procedures (including critical care time)  Medications Ordered in UC Medications - No data to display  Initial Impression / Assessment and Plan / UC Course  I have reviewed the triage vital signs and the nursing notes.  Pertinent labs & imaging results that were available during my care of the patient were reviewed by me and considered in my medical decision making (see chart for details).     Patient afebrile, nontoxic, with SpO2 88% on presentation, 95% w/ 2L O2 via [place].  Rapid Covid negative, patient is status post immunization in February: PCR deferred.  EKG done in office, reviewed by me without previous to compare.  Significant for sinus tachycardia (122 bpm) with first-degree AV block, possible LAE, ST and T wave abnormalities concerning for lateral ischemia.  Chest x-ray done office, reviewed by me and radiologist without previous to compare: Significant for probable bilateral pulmonary edema with mild bilateral pleural effusions and mild cardiomegaly.  Signs and symptoms concerning for heart failure without unknown cause.  Reviewed findings with patient verbalized understanding: Agreeable to transfer to ER via EMS.  Done so in stable condition. Final Clinical Impressions(s) / UC Diagnoses   Final diagnoses:  Bilateral lower extremity edema  Bilateral pleural effusion  Acute pulmonary edema (HCC)  SOB (shortness of breath)  Abnormal EKG   Discharge Instructions   None    ED Prescriptions    None     PDMP not  reviewed this encounter.   Hall-Potvin, [place], PA-C 09/18/19 1143    Hall-Potvin, [place], [place] 09/18/19 1145

## 2019-09-18 NOTE — Consult Note (Addendum)
Advanced Heart Failure Team Consult Note   Primary Physician: Patient, No Pcp Per PCP-Cardiologist:  No primary care provider on file.  Reason for Consultation: Cardiogenic Shock   HPI:    Dennis Roberts is seen today for evaluation of cardiogenic shock  at the request of Dr Anne Fu.   Dennis Roberts is a 74 year old no previous medical history. He does not take medications. No previous cardiac history. He has had both covid vaccines.   Today he presented to [facility] with increased dyspnea.  On Monday he developed shortness of breath. CXR with bilateral pulmonary edema. Pertinent admission labs included: SARS POC negative, BNP 1784, Hgb 15, HS Trop 366>294. Urgent ECHO performed and showed EF 09/18/19 EF ~20% RV down. Given 80 mg IV lasix in the ED.   Taken urgently to cath lab. Acutely SOB. Placed on BIPAP.  LHC with multivessel disease RA 13 PCWP 33 PAD 52/32 (41)  Therm CO 3.73 Therm CI 1.87  Fick CO 3.67  Fick Index 1.84  PA Sat 62%      Review of Systems: [y] = yes, [ ]  = no   . General: Weight gain [ ] ; Weight loss [ ] ; Anorexia [ ] ; Fatigue [ ] ; Fever [ ] ; Chills [ ] ; Weakness [ ]   . Cardiac: Chest pain/pressure [ ] ; Resting SOB [Y; Exertional SOB [Y ]; Orthopnea [ ] ; Pedal Edema [ ] ; Palpitations [ ] ; Syncope [ ] ; Presyncope [ ] ; Paroxysmal nocturnal dyspnea[ ]   . Pulmonary: Cough [ ] ; Wheezing[ ] ; Hemoptysis[ ] ; Sputum [ ] ; Snoring [ ]   . GI: Vomiting[ ] ; Dysphagia[ ] ; Melena[ ] ; Hematochezia [ ] ; Heartburn[ ] ; Abdominal pain [ ] ; Constipation [ ] ; Diarrhea [ ] ; BRBPR [ ]   . GU: Hematuria[ ] ; Dysuria [ ] ; Nocturia[ ]   . Vascular: Pain in legs with walking [ ] ; Pain in feet with lying flat [ ] ; Non-healing sores [ ] ; Stroke [ ] ; TIA [ ] ; Slurred speech [ ] ;  . Neuro: Headaches[ ] ; Vertigo[ ] ; Seizures[ ] ; Paresthesias[ ] ;Blurred vision [ ] ; Diplopia [ ] ; Vision changes [ ]   . Ortho/Skin: Arthritis [ ] ; Joint pain [ ] ; Muscle pain [ ] ; Joint swelling [ ] ; Back Pain [ ] ; Rash [ ]     . Psych: Depression[ ] ; Anxiety[ ]   . Heme: Bleeding problems [ ] ; Clotting disorders [ ] ; Anemia [ ]   . Endocrine: Diabetes [ ] ; Thyroid dysfunction[ ]   Home Medications Prior to Admission medications   Medication Sig Start Date End Date Taking? Authorizing Provider  ibuprofen (ADVIL) 200 MG tablet Take 200-400 mg by mouth every 6 (six) hours as needed for moderate pain.   Yes [provider]  lactobacillus acidophilus (BACID) TABS tablet Take 1 tablet by mouth daily.    Yes [provider]  Multiple Vitamin (MULTIVITAMIN) capsule Take 1 capsule by mouth daily.   Yes [provider]  OVER THE COUNTER MEDICATION Apply 1 application topically daily as needed (pain). CBD cream   Yes [provider]    Past Medical History: History reviewed. No pertinent past medical history.  Past Surgical History: Past Surgical History:  Procedure Laterality Date  . NOSE SURGERY      Family History: Family History  Problem Relation Age of Onset  . Healthy Mother   . Cancer Father   . Heart disease Father        bypass at 17    Social History: Social History   Socioeconomic History  .  Marital status: Divorced    Spouse name: Not on file  . Number of children: Not on file  . Years of education: Not on file  . Highest education level: Not on file  Occupational History  . Not on file  Tobacco Use  . Smoking status: Former Smoker    Quit date: 1974    Years since quitting: 47.2  . Smokeless tobacco: Former Engineer, water and Sexual Activity  . Alcohol use: Not Currently    Comment: occ  . Drug use: Never  . Sexual activity: Not on file  Other Topics Concern  . Not on file  Social History Narrative  . Not on file   Social Determinants of Health   Financial Resource Strain:   . Difficulty of Paying Living Expenses:   Food Insecurity:   . Worried About Programme researcher, broadcasting/film/video in the Last Year:   . Barista in the Last Year:    Transportation Needs:   . Freight forwarder (Medical):   Marland Kitchen Lack of Transportation (Non-Medical):   Physical Activity:   . Days of Exercise per Week:   . Minutes of Exercise per Session:   Stress:   . Feeling of Stress :   Social Connections:   . Frequency of Communication with Friends and Family:   . Frequency of Social Gatherings with Friends and Family:   . Attends Religious Services:   . Active Member of Clubs or Organizations:   . Attends Banker Meetings:   Marland Kitchen Marital Status:     Allergies:  Allergies  Allergen Reactions  . Penicillins   . Sulfa Antibiotics     Objective:    Vital Signs:   Temp:  [98.7 F (37.1 C)] 98.7 F (37.1 C) (03/12 1234) Pulse Rate:  [111-123] 115 (03/12 1430) Resp:  [22-30] 29 (03/12 1430) BP: (126-147)/(90-103) 131/103 (03/12 1430) SpO2:  [88 %-97 %] 93 % (03/12 1547)    Weight change: There were no vitals filed for this visit.  Intake/Output:  No intake or output data in the 24 hours ending 09/18/19 1551    Physical Exam    General:  Sitting upright in respiratory distress/ Using accessory muscles.  HEENT: normal Neck: supple. JVP to jaw . Carotids 2+ bilat; no bruits. No lymphadenopathy or thyromegaly appreciated. Cor: PMI nondisplaced. Tachy, Regular rate & rhythm. No rubs, gallops or murmurs. Lungs: clear Abdomen: soft, nontender, nondistended. No hepatosplenomegaly. No bruits or masses. Good bowel sounds. Extremities: no cyanosis, clubbing, rash, cool Neuro: alert & orientedx3, cranial nerves grossly intact. moves all 4 extremities w/o difficulty.    Telemetry   Sinus Tach 120s   EKG      Labs   Basic Metabolic Panel: Recent Labs  Lab 09/18/19 1300 09/18/19 1520  NA 132* 133*  K 4.4 4.5  CL 101  --   CO2 17*  --   GLUCOSE 131*  --   BUN 33*  --   CREATININE 1.06  --   CALCIUM 9.2  --     Liver Function Tests: Recent Labs  Lab 09/18/19 1300  AST 37  ALT 31  ALKPHOS 76  BILITOT  1.3*  PROT 6.7  ALBUMIN 4.3   No results for input(s): LIPASE, AMYLASE in the last 168 hours. No results for input(s): AMMONIA in the last 168 hours.  CBC: Recent Labs  Lab 09/18/19 1300 09/18/19 1520  WBC 10.7*  --   NEUTROABS 7.5  --  HGB 15.2 14.6  HCT 46.0 43.0  MCV 93.3  --   PLT 179  --     Cardiac Enzymes: No results for input(s): CKTOTAL, CKMB, CKMBINDEX, TROPONINI in the last 168 hours.  BNP: BNP (last 3 results) Recent Labs    09/18/19 1300  BNP 1,784.5*    ProBNP (last 3 results) No results for input(s): PROBNP in the last 8760 hours.   CBG: No results for input(s): GLUCAP in the last 168 hours.  Coagulation Studies: No results for input(s): LABPROT, INR in the last 72 hours.   Imaging   DG Chest 2 View  Result Date: 09/18/2019 CLINICAL DATA:  Shortness of breath. EXAM: CHEST - 2 VIEW COMPARISON:  None. FINDINGS: Mild cardiomegaly is noted. No pneumothorax is noted. Bilateral interstitial densities are noted concerning for pulmonary edema with mild bilateral pleural effusions. Bony thorax is unremarkable. IMPRESSION: Probable bilateral pulmonary edema with mild bilateral pleural effusions. Electronically Signed   By: Lupita Raider M.D.   On: 09/18/2019 11:26      Medications:     Current Medications: . [MAR Hold] hydrALAZINE  10 mg Intravenous Once  . [MAR Hold] sodium chloride flush  3 mL Intravenous Q12H     Infusions:      Assessment/Plan  1. Acute Hypoxic Respiratory Failure  -Acutely short of breath in the cath lab. Placed on Bipap.   2. Cardiogenic Shock - Acute Systolic Heart Failure -BNP elevated  1784.  -ECHO completed with severely reduced LVEF 20% RV down as well.  -Cath showed. Elevated filling pressures, low output, coronaries showed multivessel disease. CT surgery consulted  - Keep swan.  -Diurese with IV lasix.  -Started on milrinone 0.25 mcg.  -Hemodynamics - elevated filling pressures  3. CAD - HS Troponin  612 -LHC today with severe multivessel disease. Urgent CT surgery consult.     Admit to 2H    Length of Stay: 0  Dennis Becket, NP  09/18/2019, 3:51 PM  Advanced Heart Failure Team Pager [phone] (M-F; 7a - 4p)  Please contact [facility] for night-coverage after hours (4p -7a ) and weekends on [email or web address]   Agree with above  74 y/o male with no significant past medical history presented to the ER today with a several day history of progressive shortness of breath.  He was seen in the ER by Dr. Anne Fu and felt to have cardiogenic shock in the setting of probable multivessel coronary artery disease.  He developed progressive respiratory distress and was brought emergently to the catheterization lab.  I saw him in the catheterization lab with Dr. Anne Fu.  He was very short of breath and tripoding.  He was unable to lie flat.  He was pale and diaphoretic.  Started milrinone 0.25.  He was already given Lasix in the ER.  We also started BiPAP.  We consulted anesthesia emergently to discuss possible emergent intubation.  He was seen by Dr. Noreene Larsson.  Fortunately he was able to improve some with BiPAP and milrinone.  After about 20 minutes of observation.  Decided to proceed with catheterization with BiPAP support.  Cardiac catheterization showed severe three-vessel disease with 99% left main.  Right heart cath showed low output with marked volume overload.  He was seen by Dr. Tyrone Sage in the catheterization lab.  We made the decision to place an Impella CP device to stabilize prior to going to the operating room.  An Impella CP device was placed without difficulty.  Patient was transiently on norepinephrine  but we were able to wean this off.  Post catheterization has been transferred to the CCU He is lying flat in bed on BiPAP.  JVP to jaw. Cardiac tachycardic and regular plus S3 plus Impella hum Lungs have crackles bilaterally. Abdomen is soft nontender nondistended good bowel sounds Extremities  are pale no edema.  Impella site looks good. Neuro: alert & oriented x 3, cranial nerves grossly intact. moves all 4 extremities w/o difficulty. Affect pleasant  Assessment and plan.  He has severe cardiogenic shock in the setting of multivessel coronary artery disease.  He is now been stabilized with milrinone and Impella support.  He has been seen by Dr. Orvan Seen.  Will attempt to optimize overnight plan possible coronary artery bypass grafting in the morning.  CRITICAL CARE Performed by: Glori Bickers  Total critical care time: 55 minutes  Critical care time was exclusive of separately billable procedures and treating other patients.  Critical care was necessary to treat or prevent imminent or life-threatening deterioration.  Critical care was time spent personally by me (independent of midlevel providers or residents) on the following activities: development of treatment plan with patient and/or surrogate as well as nursing, discussions with consultants, evaluation of patient's response to treatment, examination of patient, obtaining history from patient or surrogate, ordering and performing treatments and interventions, ordering and review of laboratory studies, ordering and review of radiographic studies, pulse oximetry and re-evaluation of patient's condition.  Glori Bickers, MD  7:42 PM

## 2019-09-18 NOTE — Progress Notes (Signed)
Pt transported from Cath Lab to 2H05 on Bipap.

## 2019-09-18 NOTE — Progress Notes (Addendum)
ANTICOAGULATION CONSULT NOTE - Initial Consult  Pharmacy Consult for heparin Indication: Impella  Allergies  Allergen Reactions  . Penicillins   . Sulfa Antibiotics     Patient Measurements: Weight: 154 lb 5.2 oz (70 kg) Heparin Dosing Weight: 70kg  Vital Signs: Temp: 98.7 F (37.1 C) (03/12 1234) Temp Source: Oral (03/12 1234) BP: 143/115 (03/12 1816) Pulse Rate: 0 (03/12 1821)  Labs: Recent Labs    09/18/19 1300 09/18/19 1500 09/18/19 1520 09/18/19 1618 09/18/19 1618 09/18/19 1619 09/18/19 1624  HGB 15.2  --    < > 14.3   < > 14.6 14.3  HCT 46.0  --    < > 42.0  --  43.0 42.0  PLT 179  --   --   --   --   --   --   CREATININE 1.06  --   --   --   --   --   --   TROPONINIHS 612* 641*  --   --   --   --   --    < > = values in this interval not displayed.    CrCl cannot be calculated (Unknown ideal weight.).   Medical History: History reviewed. No pertinent past medical history.   Assessment: 43 yoM admitted with cardiogenic shock s/p Impella CP placement. Pharmacy consulted for IV heparin dosing. Urgent CABG planned tomorrow. Heparinized purge running, will check ACT q1h and begin systemic heparin once ACT <160.  ADDENDUM: heparin running via purge (~700 units/h) and ACT is in 120s. Will add systemic heparin.  Goal of Therapy:  Heparin level 0.2-0.5 units/ml Monitor platelets by anticoagulation protocol: Yes   Plan:  -Continue heparinized purge solution -Begin heparin 200 units/h peripherally -Check heparin level in 6hr x24h then q12h once stable   Fredonia Highland, PharmD, BCPS Clinical Pharmacist [phone] Please check AMION for all [facility] numbers 09/18/2019

## 2019-09-18 NOTE — ED Triage Notes (Signed)
Pt in from UC via [facility] w/sob, started Monday and has worsened since. Sats 88% on RA, 98% when on [facility]. Received 2nd Pfizer vaccine on 2/16, denies any fevers, does have a cough. CXR done PTA, showed fluid, has crackles present

## 2019-09-18 NOTE — ED Notes (Signed)
Pt discharged to the emergency department for further evaluation. Pt verbalized understanding. Verbal report given to EMS at the bedside.

## 2019-09-18 NOTE — Progress Notes (Signed)
  With the help of the Impella rep, Impella position was checked at the bedside using portable ultrasound.  The position was adjusted for distance of approximately 3.5 cm from the aortic valve to the Impella teardrop.  Arvilla Meres, MD  7:44 PM

## 2019-09-18 NOTE — Progress Notes (Signed)
  Echocardiogram 2D Echocardiogram has been performed.  Dennis Roberts 09/18/2019, 3:34 PM

## 2019-09-18 NOTE — H&P (Addendum)
Cardiology Admission History and Physical:   Patient ID: DEMARIUS ARCHILA MRN: [id number]; DOB: 02-Jun-1946   Admission date: 09/18/2019  Primary Care Provider: Patient, No Pcp Per Primary Cardiologist: New Primary Electrophysiologist:  None   Chief Complaint:  Shortness of breath  Patient Profile:   Dennis Roberts is a 74 y.o. male without significant pmh who presents to the ED for shortness of breath.   History of Present Illness:   Dennis Roberts has not been seen by cardiology in the past. He reports regular health checks. No h/o of MI, stent, arrhythmia, stroke, cancer, HTN, HLD, DM2. Patient denies recent tobacco (quit 1974). No alcohol, drug use. Family h/o positive for Father with bypass at 64 years old. Not on any medications at home.   The patient presented to the ED 09/18/19 for shortness of breath. He complains of increased dyspnea since Monday. No chest pain. Reports orthopnea, hasn't slept in 3 days. He has had fever and chills since getting his second COVID vaccine. Also noted some swelling but thought it was due to being sitting up and on his feet all week.   In the ED BP 144/89, pulse 120, temp 98, RR 28. On 2L O2. Labs showed potassium 4.4,creatinine 1.06, glucose 131. WBC 10.7, Hgb 15.2, BNP 1,784. HAS troponin 612. D-dimer 0.45. Rapid COVID negative. Patient was given IV lasix in the ED. CXR showed bilateral pulmonary edema with mild B/L pleural effusions. EKG shows sinus tach with diffuse minimal ST depression and minimal ST elevation aVR. ABG drawn. Given 10mg  hydralazine in the ED .  Plan to admit to ICU.  Bedside echo by Dr. showed decreased pump function. Official echo pending.   History reviewed. No pertinent past medical history.  Past Surgical History:  Procedure Laterality Date  . NOSE SURGERY       Medications Prior to Admission: Prior to Admission medications   Medication Sig Start Date End Date Taking? Authorizing Provider  ibuprofen (ADVIL) 200 MG  tablet Take 200-400 mg by mouth every 6 (six) hours as needed for moderate pain.   Yes [provider]  lactobacillus acidophilus (BACID) TABS tablet Take 1 tablet by mouth daily.    Yes [provider]  Multiple Vitamin (MULTIVITAMIN) capsule Take 1 capsule by mouth daily.   Yes [provider]  OVER THE COUNTER MEDICATION Apply 1 application topically daily as needed (pain). CBD cream   Yes [provider]     Allergies:    Allergies  Allergen Reactions  . Penicillins   . Sulfa Antibiotics     Social History:   Social History   Socioeconomic History  . Marital status: Divorced    Spouse name: Not on file  . Number of children: Not on file  . Years of education: Not on file  . Highest education level: Not on file  Occupational History  . Not on file  Tobacco Use  . Smoking status: Former [employer]  . Smokeless tobacco: Former Games developer and Sexual Activity  . Alcohol use: Yes    Comment: occ  . Drug use: Never  . Sexual activity: Not on file  Other Topics Concern  . Not on file  Social History Narrative  . Not on file   Social Determinants of Health   Financial Resource Strain:   . Difficulty of Paying Living Expenses:   Food Insecurity:   . Worried About Engineer, water in the Last Year:   . Programme researcher, broadcasting/film/video of  Food in the Last Year:   Transportation Needs:   . Freight forwarder (Medical):   Marland Kitchen Lack of Transportation (Non-Medical):   Physical Activity:   . Days of Exercise per Week:   . Minutes of Exercise per Session:   Stress:   . Feeling of Stress :   Social Connections:   . Frequency of Communication with Friends and Family:   . Frequency of Social Gatherings with Friends and Family:   . Attends Religious Services:   . Active Member of Clubs or Organizations:   . Attends Banker Meetings:   Marland Kitchen Marital Status:   Intimate Partner Violence:   . Fear of Current or Ex-Partner:   . Emotionally Abused:   Marland Kitchen  Physically Abused:   . Sexually Abused:     Family History:   The patient's family history includes Cancer in his father; Healthy in his mother; Heart disease in his father.    ROS:  Please see the history of present illness.  All other ROS reviewed and negative.     Physical Exam/Data:   Vitals:   09/18/19 1345 09/18/19 1400 09/18/19 1415 09/18/19 1430  BP: (!) 128/95 126/90 (!) 134/99 (!) 131/103  Pulse: (!) 113 (!) 113 (!) 118 (!) 115  Resp: (!) 26 (!) 22 (!) 25 (!) 29  Temp:      TempSrc:      SpO2: 96% 96% 94% 95%   No intake or output data in the 24 hours ending 09/18/19 1515 No flowsheet data found.   There is no height or weight on file to calculate BMI.  General:  Well nourished, well developed, in no acute distress HEENT: normal Lymph: no adenopathy Neck: + JVD Endocrine:  No thryomegaly Vascular: No carotid bruits; FA pulses 2+ bilaterally without bruits  Cardiac:  normal S1, S2; RRR; no murmur  Lungs:  Crackles B/L ; 2L O2 Abd: soft, nontender, no hepatomegaly  Ext: 1+ edema Musculoskeletal:  No deformities, BUE and BLE strength normal and equal Skin: warm and dry  Neuro:  CNs 2-12 intact, no focal abnormalities noted Psych:  Normal affect    EKG:  The ECG that was done 09/18/19 was personally reviewed and demonstrates EKG shows sinus tach with diffuse minimal ST depression and minimal ST elevation aVR.  Relevant CV Studies:  Echo ordered  Laboratory Data:  High Sensitivity Troponin:   Recent Labs  Lab 09/18/19 1300  TROPONINIHS 612*      Chemistry Recent Labs  Lab 09/18/19 1300  NA 132*  K 4.4  CL 101  CO2 17*  GLUCOSE 131*  BUN 33*  CREATININE 1.06  CALCIUM 9.2  GFRNONAA >60  GFRAA >60  ANIONGAP 14    Recent Labs  Lab 09/18/19 1300  PROT 6.7  ALBUMIN 4.3  AST 37  ALT 31  ALKPHOS 76  BILITOT 1.3*   Hematology Recent Labs  Lab 09/18/19 1300  WBC 10.7*  RBC 4.93  HGB 15.2  HCT 46.0  MCV 93.3  MCH 30.8  MCHC 33.0  RDW  13.4  PLT 179   BNP Recent Labs  Lab 09/18/19 1300  BNP 1,784.5*    DDimer  Recent Labs  Lab 09/18/19 1300  DDIMER 0.45     Radiology/Studies:  DG Chest 2 View  Result Date: 09/18/2019 CLINICAL DATA:  Shortness of breath. EXAM: CHEST - 2 VIEW COMPARISON:  None. FINDINGS: Mild cardiomegaly is noted. No pneumothorax is noted. Bilateral interstitial densities are noted concerning for pulmonary  edema with mild bilateral pleural effusions. Bony thorax is unremarkable. IMPRESSION: Probable bilateral pulmonary edema with mild bilateral pleural effusions. Electronically Signed   By: Lupita Raider M.D.   On: 09/18/2019 11:26   {  Assessment and Plan:   Shortness of breath Patient presents with progressive SOB and orthopnea with lower leg swelling for the last 5 days. No chest pain. BNP 1,784. Hs troponin 612. CXR with pulmonary edema with mild B/L pleural effusions. EKG with diffuse minimal ST depression. - Suspect heart failure. Bedside echo with decreased EF . Official echo showed EF 15-20% - Patient with 2L O2 and labored breathing  - ABG pending - admit to ICU - start Entresto 24-26 - start IV lasix 40 mg - strict I/Os,  Daily weights, monitor creatinine with diuresis - Given severely decreased EF will plan for cardiac cath.  Risks and benefits of cardiac catheterization have been discussed with the patient.  These include bleeding, infection, kidney damage, stroke, heart attack, death.  The patient understands these risks and is willing to proceed.  Elevated troponin - Initial 612. Continue to trend - Denies chest pain. EKG with diffuse minimal ST depression - No h/o of CAD, MI, or stent. Family history positive for CABG in his father in his 49s - Will risk stratify with A1C and lipid panel  Minimal leukocytosis - WBC 10.7 on admission>>continue to trend - Recent COVID vaccine causing some fever and chills - afebrile here  Severity of Illness: The appropriate patient  status for this patient is INPATIENT. Inpatient status is judged to be reasonable and necessary in order to provide the required intensity of service to ensure the patient's safety. The patient's presenting symptoms, physical exam findings, and initial radiographic and laboratory data in the context of their chronic comorbidities is felt to place them at high risk for further clinical deterioration. Furthermore, it is not anticipated that the patient will be medically stable for discharge from the hospital within 2 midnights of admission. The following factors support the patient status of inpatient.   " The patient's presenting symptoms include shortness of breath. " The worrisome physical exam findings include shortness of breath. " The initial radiographic and laboratory data are worrisome because of elevated BNP. " The chronic co-morbidities include possible HTN.   * I certify that at the point of admission it is my clinical judgment that the patient will require inpatient hospital care spanning beyond 2 midnights from the point of admission due to high intensity of service, high risk for further deterioration and high frequency of surveillance required.*    For questions or updates, please contact [facility] Please consult [email or web address] for contact info under        Signed, Cadence David Stall, PA-C  09/18/2019 3:15 PM   Personally seen and examined. Agree with above.   74 year old male with remote smoking history, nondiabetic no hypertension, no prior history of cardiovascular illness here with advancing shortness of breath orthopnea that began on Monday.  Increased work of breathing.  He has completed his second Covid vaccine.  He does not recall having any chest pressure or pain preceding this event. Was sent from urgent care to the emergency room.  EKG shows diffuse ST segment depression aVR elevation suggestive of global ischemia.  pH 7.4, PCO2 23, PO2 in the 60s indicative of  hypoxic respiratory failure in the setting of metabolic acidosis with compensation, increased respiratory rate.  Bicarb 17.  Creatinine 1.06.  Echocardiogram both bedside as well  as formal echocardiogram appears to have ejection fraction of approximately 20% mild mitral regurgitation mild aortic sclerosis with no stenosis no acute regurgitation.  Pleural effusion noted.  No pericardial effusion.  RV appears mildly reduced function as well.  Moderate pulmonary hypertension estimated.  Point-of-care Covid test was negative earlier.  Rapid test currently pending.  BNP 1700, high-sensitivity troponin 612  Is having some difficulty laying down flat.  Appears to be in distress.  Feet are cold, hands are fairly warm.  JVD to earlobe noted.  Tachycardic regular heart rate in the 120s.  Blood pressure currently in the 161 systolic.  3+ pitting lower extremity edema.  Alert.  Difficulty completing full sentences.  No recent sick contacts.  Assessment and plan:  Acute systolic heart failure Cardiogenic shock Secondary pulmonary hypertension -Discussed case with Dr. Haroldine Laws of the advanced heart failure team.  We will go ahead and bring him urgently to the cardiac catheterization lab for right and left heart catheterization.  Risk and benefits have been explained to the patient including stroke heart attack death renal impairment bleeding.  He is willing to proceed.  If necessary, intubation is warranted, patient is amenable.  Given his EKG, he could very well have triple-vessel disease and require emergent revascularization. -40 mg of IV Lasix x2 have been administered. -Rapid Covid test has been administered.  Point of care was negative. -Add ARN I if able.  Avoid beta-blocker currently.  May need inotropes.  Obtain coox/cardiac output in Cath Lab. -Admit to 2 heart intensive care unit. -Moderately elevated pulmonary pressures are result of her is increased left atrial filling pressure, left heart  disease. -Gravely ill.  CRITICAL CARE Performed by: Candee Furbish   Total critical care time: 60 minutes  Critical care time was exclusive of separately billable procedures and treating other patients.  Critical care was necessary to treat or prevent imminent or life-threatening deterioration.  Critical care was time spent personally by me on the following activities: development of treatment plan with patient and/or surrogate as well as nursing, discussions with consultants, evaluation of patient's response to treatment, examination of patient, obtaining history from patient or surrogate, ordering and performing treatments and interventions, ordering and review of laboratory studies, ordering and review of radiographic studies, pulse oximetry and re-evaluation of patient's condition.  Candee Furbish, MD

## 2019-09-18 NOTE — Consult Note (Signed)
[address]       [place] [address]             [phone]        Dennis Roberts [facility] Medical Record #[id number] Date of Birth: 1945/10/14  Referring: No ref. provider found Primary Care: Patient, No Pcp Per Primary Cardiologist:No primary care provider on file.  Chief Complaint:    Chief Complaint  Patient presents with  . Shortness of Breath    History of Present Illness:     74 yo man previously healthy presented with relatively acute SOB. His work-up showed elevated BNP and troponin. 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 Consult received for CABG. Given the urgent nature of the presentation, it was agreed as a cardiogenic shock decision making unit that Impella should be placed via the groin and re-evaluate for surgery. He has stabilized since Impella was placed and is now clinically improved. Denies active CP and SOB is improved.    Current Activity/ Functional Status: Patient will be independent with mobility/ambulation, transfers, ADL's, IADL's.   Zubrod Score: At the time of surgery this patient's most appropriate activity status/level should be described as: []     0    Normal activity, no symptoms []     1    Restricted in physical strenuous activity but ambulatory, able to do out light work []     2    Ambulatory and capable of self care, unable to do work activities, up and about                 more than 50%  Of the time                            []     3    Only limited self care, in bed greater than 50% of waking hours []     4    Completely disabled, no self care, confined to bed or chair []     5    Moribund  History reviewed. No pertinent past medical history.  Past Surgical History:  Procedure Laterality Date  . NOSE SURGERY      Social History   Tobacco Use  Smoking Status Former Smoker  . Quit date: 53  . Years since quitting: 61.2  Smokeless Tobacco Former    Social History   Substance and Sexual Activity  Alcohol Use Not Currently   Comment: occ     Allergies  Allergen Reactions  . Penicillins   . Sulfa Antibiotics     Current Facility-Administered Medications  Medication Dose Route Frequency Provider Last Rate Last Admin  . 0.9 %  sodium chloride infusion  250 mL Intravenous PRN Furth, Cadence H, PA-C      . 0.9 %  sodium chloride infusion  250 mL Intravenous PRN Bensimhon, , MD      . acetaminophen (TYLENOL) tablet 650 mg  650 mg Oral Q4H PRN Furth, Cadence H, PA-C      . ALPRAZolam ) tablet 0.25 mg  0.25 mg Oral BID PRN Furth, Cadence H, PA-C      . digoxin (LANOXIN) tablet 0.125 mg  0.125 mg Oral Daily Bensimhon, , MD      . furosemide (LASIX) injection 80 mg  80 mg Intravenous BID Bensimhon, 141, MD      . heparin 50,000  Units in dextrose 5 % 1,000 mL (50 Units/mL)  50,000 Units Intracatheter Continuous Bensimhon, Shaune Pascal, MD 14.8 mL/hr at 09/18/19 1815 50,000 Units at 09/18/19 1815  . heparin ADULT infusion 100 units/mL (25000 units/253mL sodium chloride 0.45%)  200 Units/hr Intravenous Continuous Einar Grad, RPH      . hydrALAZINE (APRESOLINE) injection 10 mg  10 mg Intravenous Q20 Min PRN Bensimhon, Shaune Pascal, MD      . labetalol (NORMODYNE) injection 10 mg  10 mg Intravenous Q10 min PRN Bensimhon, Shaune Pascal, MD      . milrinone (PRIMACOR) 20 MG/100 ML (0.2 mg/mL) infusion  0.25 mcg/kg/min Intravenous Continuous Bensimhon, Shaune Pascal, MD      . ondansetron (ZOFRAN) injection 4 mg  4 mg Intravenous Q6H PRN Bensimhon, Shaune Pascal, MD      . potassium chloride SA (KLOR-CON) CR tablet 40 mEq  40 mEq Oral BID Bensimhon, Shaune Pascal, MD      . sodium chloride flush (NS) 0.9 % injection 3 mL  3 mL Intravenous Q12H Bensimhon, Shaune Pascal, MD      . sodium chloride flush (NS) 0.9 % injection 3 mL  3 mL Intravenous Q12H Furth, Cadence H, PA-C      . sodium chloride flush (NS) 0.9 % injection 3 mL  3 mL Intravenous  PRN Furth, Cadence H, PA-C      . sodium chloride flush (NS) 0.9 % injection 3 mL  3 mL Intravenous Q12H Bensimhon, Shaune Pascal, MD      . sodium chloride flush (NS) 0.9 % injection 3 mL  3 mL Intravenous PRN Bensimhon, Shaune Pascal, MD      . spironolactone (ALDACTONE) tablet 12.5 mg  12.5 mg Oral Daily Bensimhon, Shaune Pascal, MD      . zolpidem (AMBIEN) tablet 5 mg  5 mg Oral QHS PRN Furth, Cadence H, PA-C        Medications Prior to Admission  Medication Sig Dispense Refill Last Dose  . ibuprofen (ADVIL) 200 MG tablet Take 200-400 mg by mouth every 6 (six) hours as needed for moderate pain.   unk  . lactobacillus acidophilus (BACID) TABS tablet Take 1 tablet by mouth daily.    09/18/2019 at Unknown time  . Multiple Vitamin (MULTIVITAMIN) capsule Take 1 capsule by mouth daily.   09/18/2019 at Unknown time  . OVER THE COUNTER MEDICATION Apply 1 application topically daily as needed (pain). CBD cream   Past Week at Unknown time    Family History  Problem Relation Age of Onset  . Healthy Mother   . Cancer Father   . Heart disease Father        bypass at 16     Review of Systems:   ROS A comprehensive review of systems was negative.     Cardiac Review of Systems: Y or  [    ]= no  Chest Pain [    ]  Resting SOB [   ] Exertional SOB  [  ]  Orthopnea [  ]   Pedal Edema [   ]    Palpitations [  ] Syncope  [  ]   Presyncope [   ]  General Review of Systems: [Y] = yes [  ]=no Constitional: recent weight change [  ]; anorexia [  ]; fatigue [  ]; nausea [  ]; night sweats [  ]; fever [  ]; or chills [  ]  Dental: Last Dentist visit:   Eye : blurred vision [  ]; diplopia [   ]; vision changes [  ];  Amaurosis fugax[  ]; Resp: cough [  ];  wheezing[  ];  hemoptysis[  ]; shortness of breath[  ]; paroxysmal nocturnal dyspnea[  ]; dyspnea on exertion[  ]; or orthopnea[  ];  GI:  gallstones[  ], vomiting[  ];  dysphagia[  ]; melena[  ];   hematochezia [  ]; heartburn[  ];   Hx of  Colonoscopy[  ]; GU: kidney stones [  ]; hematuria[  ];   dysuria [  ];  nocturia[  ];  history of     obstruction [  ]; urinary frequency [  ]             Skin: rash, swelling[  ];, hair loss[  ];  peripheral edema[  ];  or itching[  ]; Musculosketetal: myalgias[  ];  joint swelling[  ];  joint erythema[  ];  joint pain[  ];  back pain[  ];  Heme/Lymph: bruising[  ];  bleeding[  ];  anemia[  ];  Neuro: TIA[  ];  headaches[  ];  stroke[  ];  vertigo[  ];  seizures[  ];   paresthesias[  ];  difficulty walking[  ];  Psych:depression[  ]; anxiety[  ];  Endocrine: diabetes[  ];  thyroid dysfunction[  ];      Physical Exam: BP (!) 127/99   Pulse (!) 109   Temp 98.4 F (36.9 C)   Resp (!) 25   Wt 70 kg   SpO2 100%    General appearance: alert and cooperative Head: Normocephalic, without obvious abnormality, atraumatic Neck: no adenopathy, no carotid bruit, no JVD, supple, symmetrical, trachea midline and thyroid not enlarged, symmetric, no tenderness/mass/nodules Resp: diminished breath sounds bibasilar Cardio: regular rate and rhythm, S1, S2 normal, no murmur, click, rub or gallop GI: soft, non-tender; bowel sounds normal; no masses,  no organomegaly Extremities: extremities normal, atraumatic, no cyanosis or edema Neurologic: Grossly normal  Diagnostic Studies & Laboratory data:     Recent Radiology Findings:   DG Chest 2 View  Result Date: 09/18/2019 CLINICAL DATA:  Shortness of breath. EXAM: CHEST - 2 VIEW COMPARISON:  None. FINDINGS: Mild cardiomegaly is noted. No pneumothorax is noted. Bilateral interstitial densities are noted concerning for pulmonary edema with mild bilateral pleural effusions. Bony thorax is unremarkable. IMPRESSION: Probable bilateral pulmonary edema with mild bilateral pleural effusions. Electronically Signed   By: Lupita RaiderJames  Green Jr M.D.   On: 09/18/2019 11:26   CARDIAC CATHETERIZATION  Result Date: 09/18/2019   Prox RCA lesion is 99% stenosed.  Dist RCA lesion is 100% stenosed.  Mid LM to Dist LM lesion is 99% stenosed.  Ost Cx to Prox Cx lesion is 99% stenosed.  Mid Cx to Dist Cx lesion is 60% stenosed.  LPAV lesion is 80% stenosed.  Ost LAD lesion is 90% stenosed.  Prox LAD to Mid LAD lesion is 80% stenosed.  Mid LAD lesion is 80% stenosed.  Findings: Ao = 155/78 (96) LV = 114/39 RA = 13 RV = 50/12 PA = 52/32 (41) PCW = 33 Fick cardiac output/index = 3.7/1.84 Thermo CO/CI = 3.7/1.87 PVR = 2.1 WU SVR = 1809 FA sat = 97% PA sat = 62%, 62% CPO = 0.78 (on milrinone 0.25) Assessment: 1. 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 Cardiogenic shock Plan/Discussion: He has been stabilized with Impella support. Transfer  to ICU. TCTS has seen. Likely emergent cath in am. Arvilla Meres, MD 6:55 PM   ECHOCARDIOGRAM COMPLETE  Result Date: 09/18/2019    ECHOCARDIOGRAM REPORT   Patient Name:   Dennis Roberts Lett Date of Exam: 09/18/2019 Medical Rec #:  [id number]    Height: Accession #:    [id number]   Weight: Date of Birth:  [birth year]     BSA: Patient Age:    74 years     BP:           131/103 mmHg Patient Gender: M            HR:           124 bpm. Exam Location:  Inpatient Procedure: 2D Echo Indications:     dyspnea 786.09  History:         Patient has no prior history of Echocardiogram examinations.  Sonographer:     Delcie Roch Referring Phys:  [id number] CADENCE H FURTH Diagnosing Phys: Kristeen Miss MD IMPRESSIONS  1. Left ventricular ejection fraction, by estimation, is <20%. The left ventricle has severely decreased function. The left ventricle demonstrates regional wall motion abnormalities (see scoring diagram/findings for description). The left ventricular internal cavity size was moderately to severely dilated. Left ventricular diastolic parameters are consistent with Grade III diastolic dysfunction (restrictive). There is severe akinesis of the left ventricular, mid-apical anteroseptal wall and lateral wall.  2. Right  ventricular systolic function is moderately reduced. The right ventricular size is normal. There is moderately elevated pulmonary artery systolic pressure.  3. The mitral valve is grossly normal. Trivial mitral valve regurgitation. No evidence of mitral stenosis.  4. The aortic valve is tricuspid. Aortic valve regurgitation is not visualized. No aortic stenosis is present. FINDINGS  Left Ventricle: Left ventricular ejection fraction, by estimation, is <20%. The left ventricle has severely decreased function. The left ventricle demonstrates regional wall motion abnormalities. Severe akinesis of the left ventricular, mid-apical anteroseptal wall and lateral wall. The left ventricular internal cavity size was moderately to severely dilated. There is no left ventricular hypertrophy. Left ventricular diastolic parameters are consistent with Grade III diastolic dysfunction (restrictive). Right Ventricle: The right ventricular size is normal. No increase in right ventricular wall thickness. Right ventricular systolic function is moderately reduced. There is moderately elevated pulmonary artery systolic pressure. The tricuspid regurgitant velocity is 3.66 m/s, and with an assumed right atrial pressure of 5 mmHg, the estimated right ventricular systolic pressure is 58.6 mmHg. Left Atrium: Left atrial size was normal in size. Right Atrium: Right atrial size was normal in size. Pericardium: Trivial pericardial effusion is present. Mitral Valve: The mitral valve is grossly normal. Trivial mitral valve regurgitation. No evidence of mitral valve stenosis. Tricuspid Valve: The tricuspid valve is normal in structure. Tricuspid valve regurgitation is not demonstrated. Aortic Valve: The aortic valve is tricuspid. . There is mild thickening and mild calcification of the aortic valve. Aortic valve regurgitation is not visualized. No aortic stenosis is present. Mild aortic valve annular calcification. There is mild thickening of the  aortic valve. There is mild calcification of the aortic valve. Pulmonic Valve: The pulmonic valve was normal in structure. Pulmonic valve regurgitation is not visualized. No evidence of pulmonic stenosis. Aorta: The aortic root and ascending aorta are structurally normal, with no evidence of dilitation. IAS/Shunts: The atrial septum is grossly normal.  LEFT VENTRICLE PLAX 2D LVIDd:         5.40 cm LVIDs:         4.80  cm LV PW:         0.90 cm LV IVS:        0.90 cm LVOT diam:     2.20 cm LV SV:         20 LVOT Area:     3.80 cm  LV Volumes (MOD) LV vol d, MOD A4C: 99.1 ml LV vol s, MOD A4C: 72.9 ml LV SV MOD A4C:     99.1 ml RIGHT VENTRICLE RV S prime:     11.20 cm/s TAPSE (M-mode): 1.4 cm LEFT ATRIUM             RIGHT ATRIUM LA diam:        3.70 cm RA Area:     12.60 cm LA Vol (A2C):   64.4 ml RA Volume:   31.50 ml LA Vol (A4C):   34.7 ml LA Biplane Vol: 50.8 ml  AORTIC VALVE LVOT Vmax:   48.40 cm/s LVOT Vmean:  29.700 cm/s LVOT VTI:    0.052 m  AORTA Ao Root diam: 3.50 cm Ao Asc diam:  3.40 cm TRICUSPID VALVE TR Peak grad:   53.6 mmHg TR Vmax:        366.00 cm/s  SHUNTS Systemic VTI:  0.05 m Systemic Diam: 2.20 cm Kristeen Miss MD Electronically signed by Kristeen Miss MD Signature Date/Time: 09/18/2019/4:56:32 PM    Final (Updated)      I have independently reviewed the above radiologic studies and discussed with the patient   Recent Lab Findings: Lab Results  Component Value Date   WBC 8.2 09/18/2019   HGB 13.2 09/18/2019   HCT 39.3 09/18/2019   PLT 155 09/18/2019   GLUCOSE 131 (H) 09/18/2019   ALT 31 09/18/2019   AST 37 09/18/2019   NA 135 09/18/2019   K 4.1 09/18/2019   CL 101 09/18/2019   CREATININE 1.06 09/18/2019   BUN 33 (H) 09/18/2019   CO2 17 (L) 09/18/2019   HGBA1C 4.9 09/18/2019      Assessment / Plan:       To OR for urgent CABG early on 09/19/19. He appears to understand the risks and benefits and the need for CABG as best treatment option given his condition and wishes  to proceed.    I  spent 30 minutes counseling the patient face to face.   Shanon Becvar Z. Vickey Sages, MD [phone] 09/18/2019 9:04 PM

## 2019-09-18 NOTE — ED Provider Notes (Signed)
[facility] EMERGENCY DEPARTMENT Provider Note   CSN: [id number] Arrival date & time: 09/18/19  1227     History Chief Complaint  Patient presents with   Shortness of Breath    Dennis Roberts is a 74 y.o. male.  The history is provided by the patient, the EMS personnel and medical records. No language interpreter was used.  Shortness of Breath Severity:  Severe Onset quality:  Gradual Duration:  4 days Timing:  Constant Progression:  Worsening Chronicity:  New Context: not URI   Relieved by:  Nothing Worsened by:  Nothing (laying flay) Ineffective treatments:  None tried Associated symptoms: cough and sputum production (phlegm)   Associated symptoms: no abdominal pain, no chest pain, no diaphoresis, no fever, no headaches, no neck pain, no rash, no vomiting and no wheezing   Risk factors: no hx of PE/DVT        No past medical history on file.  There are no problems to display for this patient.   Past Surgical History:  Procedure Laterality Date   NOSE SURGERY         Family History  Problem Relation Age of Onset   Healthy Mother    Cancer Father     Social History   Tobacco Use   Smoking status: Former Smoker   Smokeless tobacco: Former Engineer, water Use Topics   Alcohol use: Yes    Comment: occ   Drug use: Never    Home Medications Prior to Admission medications   Medication Sig Start Date End Date Taking? Authorizing Provider  lactobacillus acidophilus (BACID) TABS tablet Take 2 tablets by mouth 3 (three) times daily.    [provider]  Multiple Vitamin (MULTIVITAMIN) capsule Take 1 capsule by mouth daily.    [provider]    Allergies    Penicillins and Sulfa antibiotics  Review of Systems   Review of Systems  Constitutional: Negative for chills, diaphoresis, fatigue and fever.  HENT: Negative for congestion.   Respiratory: Positive for cough, sputum production (phlegm), chest tightness  and shortness of breath. Negative for wheezing and stridor.   Cardiovascular: Positive for leg swelling (at his baseline). Negative for chest pain and palpitations.  Gastrointestinal: Negative for abdominal pain, constipation, diarrhea, nausea and vomiting.  Genitourinary: Negative for dysuria.  Musculoskeletal: Negative for back pain and neck pain.  Skin: Negative for rash.  Neurological: Negative for dizziness, light-headedness and headaches.  Psychiatric/Behavioral: Negative for agitation.  All other systems reviewed and are negative.   Physical Exam Updated Vital Signs Temp 98.7 F (37.1 C) (Oral)    SpO2 (!) 88%   Physical Exam Vitals and nursing note reviewed.  Constitutional:      General: He is not in acute distress.    Appearance: He is well-developed. He is not ill-appearing, toxic-appearing or diaphoretic.  HENT:     Head: Normocephalic and atraumatic.     Mouth/Throat:     Mouth: Mucous membranes are moist.  Eyes:     Conjunctiva/sclera: Conjunctivae normal.  Cardiovascular:     Rate and Rhythm: Regular rhythm. Tachycardia present.  No extrasystoles are present.    Pulses: Normal pulses.     Heart sounds: No murmur.  Pulmonary:     Effort: Pulmonary effort is normal. Tachypnea present. No respiratory distress.     Breath sounds: Rales present. No decreased breath sounds, wheezing or rhonchi.  Chest:     Chest wall: No tenderness.  Abdominal:  Palpations: Abdomen is soft.     Tenderness: There is no abdominal tenderness.  Musculoskeletal:     Cervical back: Normal range of motion and neck supple.     Right lower leg: No tenderness. Edema present.     Left lower leg: No tenderness. Edema present.  Skin:    General: Skin is warm and dry.     Capillary Refill: Capillary refill takes less than 2 seconds.     Findings: No erythema.  Neurological:     General: No focal deficit present.     Mental Status: He is alert.     ED Results / Procedures /  Treatments   Labs (all labs ordered are listed, but only abnormal results are displayed) Labs Reviewed  CBC WITH DIFFERENTIAL/PLATELET - Abnormal; Notable for the following components:      Result Value   WBC 10.7 (*)    All other components within normal limits  COMPREHENSIVE METABOLIC PANEL - Abnormal; Notable for the following components:   Sodium 132 (*)    CO2 17 (*)    Glucose, Bld 131 (*)    BUN 33 (*)    Total Bilirubin 1.3 (*)    All other components within normal limits  BRAIN NATRIURETIC PEPTIDE - Abnormal; Notable for the following components:   B Natriuretic Peptide 1,784.5 (*)    All other components within normal limits  POCT I-STAT 7, (LYTES, BLD GAS, ICA,H+H) - Abnormal; Notable for the following components:   pCO2 arterial 26.8 (*)    pO2, Arterial 61.0 (*)    Bicarbonate 17.5 (*)    TCO2 18 (*)    Acid-base deficit 5.0 (*)    Sodium 133 (*)    All other components within normal limits  TROPONIN I (HIGH SENSITIVITY) - Abnormal; Notable for the following components:   Troponin I (High Sensitivity) 612 (*)    All other components within normal limits  TROPONIN I (HIGH SENSITIVITY) - Abnormal; Notable for the following components:   Troponin I (High Sensitivity) 641 (*)    All other components within normal limits  SARS CORONAVIRUS 2 (TAT 6-24 HRS)  RESPIRATORY PANEL BY RT PCR (FLU A&B, COVID)  D-DIMER, QUANTITATIVE (NOT AT [facility])  BLOOD GAS, ARTERIAL  HEMOGLOBIN A1C    EKG EKG Interpretation  Date/Time:  Friday September 18 2019 12:34:55 EST Ventricular Rate:  123 PR Interval:    QRS Duration: 91 QT Interval:  307 QTC Calculation: 440 R Axis:   64 Text Interpretation: Sinus tachycardia Probable anterior infarct, old Repol abnrm suggests ischemia, lateral leads No prior ECG for comparison. JNo STEMI Confirmed by Theda Belfast ([id number]) on 09/18/2019 1:10:41 PM   Radiology DG Chest 2 View  Result Date: 09/18/2019 CLINICAL DATA:  Shortness of breath. EXAM:  CHEST - 2 VIEW COMPARISON:  None. FINDINGS: Mild cardiomegaly is noted. No pneumothorax is noted. Bilateral interstitial densities are noted concerning for pulmonary edema with mild bilateral pleural effusions. Bony thorax is unremarkable. IMPRESSION: Probable bilateral pulmonary edema with mild bilateral pleural effusions. Electronically Signed   By: Lupita Raider M.D.   On: 09/18/2019 11:26    Procedures Procedures (including critical care time)  CRITICAL CARE Performed by: Canary Brim Sael Furches Total critical care time: 45 minutes Critical care time was exclusive of separately billable procedures and treating other patients. Critical care was necessary to treat or prevent imminent or life-threatening deterioration. Critical care was time spent personally by me on the following activities: development of treatment plan with  patient and/or surrogate as well as nursing, discussions with consultants, evaluation of patient's response to treatment, examination of patient, obtaining history from patient or surrogate, ordering and performing treatments and interventions, ordering and review of laboratory studies, ordering and review of radiographic studies, pulse oximetry and re-evaluation of patient's condition.    Medications Ordered in ED Medications  hydrALAZINE (APRESOLINE) injection 10 mg ( Intravenous MAR Hold 09/18/19 1540)  sodium chloride flush (NS) 0.9 % injection 3 mL ( Intravenous Automatically Held 09/26/19 2200)  Heparin (Porcine) in NaCl 1000-0.9 UT/500ML-% SOLN (500 mLs  Given 09/18/19 1601)  lidocaine (PF) (XYLOCAINE) 1 % injection (2 mLs Infiltration Given 09/18/19 1605)  fentaNYL (SUBLIMAZE) injection (25 mcg Intravenous Given 09/18/19 1608)  furosemide (LASIX) injection 40 mg (40 mg Intravenous Given 09/18/19 1434)  albuterol (VENTOLIN HFA) 108 (90 Base) MCG/ACT inhaler 2 puff (2 puffs Inhalation Given 09/18/19 1502)  furosemide (LASIX) injection 40 mg (40 mg Intravenous Given  09/18/19 1528)  milrinone (PRIMACOR) 20 MG/100 ML (0.2 mg/mL) infusion (0.25 mcg/kg/min  70 kg Intravenous New Bag/Given 09/18/19 1557)    ED Course  I have reviewed the triage vital signs and the nursing notes.  Pertinent labs & imaging results that were available during my care of the patient were reviewed by me and considered in my medical decision making (see chart for details).    MDM Rules/Calculators/A&P                      Dennis Roberts is a 74 y.o. male with no significant past medical history who presents from urgent care for hypoxia, shortness of breath, and cough.  According to patient, he has been fully vaccinated with the Covid vaccine last month but has been having shortness of breath the last few days.  He report since Monday he has had worsening shortness of breath and cannot lay flat.  He has no history of this.  He does report he had a cough that is primarily dry with only small amount of phlegm.  No hemoptysis.  He denies any chest pain or palpitations.  Denies lightheadedness or syncope.  He thinks his legs are not more swollen than his baseline.  He denies history of DVT, PE, or heart failure.  He denies any Covid contacts.  He denies any other symptoms.  He went to urgent care today and was found to have hypoxia with sats in the 80s as well as tachypnea and tachycardia.  He was afebrile.  Patient had chest x-ray showing concern for fluid overload in his lungs and was sent to the emergency department.  He also had a point-of-care Covid that was negative.  He reports that he is unable to lie flat due to the shortness of breath and has not slept in several days due to this.  On arrival, patient is on 2 L maintain his oxygen saturations.  He is still tachycardic and tachypneic.  He still denies any chest pain.  He is afebrile.  He has rales in his lungs bilaterally with no wheezing.  No significant rhonchi.  Chest and abdomen are nontender.  Back is nontender.  Good pulses in  extremities.  Mild edema seen which he reports is unchanged from his baseline.  Good pulses in all extremities.  EKG shows no STEMI.  Clinically I suspect new heart failure or fluid overload causing his symptoms.  Given the hypoxia with tachycardia and tachypnea and shortness of breath, we will get a D-dimer as well  as a BNP, troponin, and labs.  As he just had a chest x-ray an hour ago, will hold on repeat chest x-ray at this time.  Will get CT PE study if dimer is positive.  Will get the PCR Covid test ordered.  Due to his new oxygen requirement and his continued tachycardia and tachypnea, he will require admission.  Will hold on fluids given the concern for fluid overload contributing to hypoxia.  Anticipate admission after work-up is completed.  Cards was called after troponin returned over 600.  BNP also elevated.  Suspect acute heart failure versus acute 3.  Cardiology requested Lasix but decided to hold on heparin initially.  They came to the patient and were concerned about him.  They requested BiPAP be started, and be given albuterol.   On their reassessment, they decided he needed to go to the Cath Lab.  They requested a 2-hour Covid test instead of the 6-hour that was already ordered.  He will go to the Cath Lab for further management.  They will admit for further management.   Final Clinical Impression(s) / ED Diagnoses Final diagnoses:  Hypoxia  SOB (shortness of breath)  Elevated troponin  Elevated brain natriuretic peptide (BNP) level     Clinical Impression: 1. Hypoxia   2. SOB (shortness of breath)   3. Elevated troponin   4. Elevated brain natriuretic peptide (BNP) level     Disposition: Admit  This note was prepared with assistance of Dragon voice recognition software. Occasional wrong-word or sound-a-like substitutions may have occurred due to the inherent limitations of voice recognition software.     Layce Sprung, Canary Brim, MD 09/18/19 [phone]

## 2019-09-19 ENCOUNTER — Inpatient Hospital Stay (HOSPITAL_COMMUNITY): Payer: Medicare Other

## 2019-09-19 ENCOUNTER — Encounter (HOSPITAL_COMMUNITY)
Admission: EM | Disposition: A | Discharge status: Home / Self Care | Payer: Self-pay | Source: Home / Self Care | Attending: Cardiothoracic Surgery

## 2019-09-19 ENCOUNTER — Inpatient Hospital Stay (HOSPITAL_COMMUNITY): Payer: Medicare Other | Admitting: Critical Care Medicine

## 2019-09-19 ENCOUNTER — Encounter (HOSPITAL_COMMUNITY): Payer: Self-pay | Admitting: Cardiology

## 2019-09-19 ENCOUNTER — Other Ambulatory Visit: Payer: Self-pay

## 2019-09-19 DIAGNOSIS — I255 Ischemic cardiomyopathy: Secondary | ICD-10-CM | POA: Diagnosis present

## 2019-09-19 DIAGNOSIS — Q211 Atrial septal defect: Secondary | ICD-10-CM

## 2019-09-19 DIAGNOSIS — I420 Dilated cardiomyopathy: Secondary | ICD-10-CM | POA: Diagnosis present

## 2019-09-19 HISTORY — PX: ENDOVEIN HARVEST OF GREATER SAPHENOUS VEIN: SHX5059

## 2019-09-19 HISTORY — PX: TEE WITHOUT CARDIOVERSION: SHX5443

## 2019-09-19 HISTORY — PX: PATENT DUCTUS ARTERIOUS REPAIR: SHX269

## 2019-09-19 HISTORY — PX: CORONARY ARTERY BYPASS GRAFT: SHX141

## 2019-09-19 LAB — POCT I-STAT 7, (LYTES, BLD GAS, ICA,H+H)
Acid-Base Excess: 2 mmol/L (ref 0.0–2.0)
Acid-base deficit: 2 mmol/L (ref 0.0–2.0)
Acid-base deficit: 4 mmol/L — ABNORMAL HIGH (ref 0.0–2.0)
Bicarbonate: 25 mmol/L (ref 20.0–28.0)
Bicarbonate: 24.8 mmol/L (ref 20.0–28.0)
Bicarbonate: 22.1 mmol/L (ref 20.0–28.0)
Calcium, Ion: 1.15 mmol/L (ref 1.15–1.40)
Calcium, Ion: 1.34 mmol/L (ref 1.15–1.40)
Calcium, Ion: 1.21 mmol/L (ref 1.15–1.40)
HCT: 36 % — ABNORMAL LOW (ref 39.0–52.0)
HCT: 31 % — ABNORMAL LOW (ref 39.0–52.0)
HCT: 25 % — ABNORMAL LOW (ref 39.0–52.0)
Hemoglobin: 12.2 g/dL — ABNORMAL LOW (ref 13.0–17.0)
Hemoglobin: 10.5 g/dL — ABNORMAL LOW (ref 13.0–17.0)
Hemoglobin: 8.5 g/dL — ABNORMAL LOW (ref 13.0–17.0)
O2 Saturation: 98 %
O2 Saturation: 92 %
O2 Saturation: 99 %
Patient temperature: 37.1
Patient temperature: 36.7
Patient temperature: 37.7
Potassium: 3.5 mmol/L (ref 3.5–5.1)
Potassium: 3.7 mmol/L (ref 3.5–5.1)
Potassium: 4 mmol/L (ref 3.5–5.1)
Sodium: 135 mmol/L (ref 135–145)
Sodium: 138 mmol/L (ref 135–145)
Sodium: 137 mmol/L (ref 135–145)
TCO2: 26 mmol/L (ref 22–32)
TCO2: 26 mmol/L (ref 22–32)
TCO2: 23 mmol/L (ref 22–32)
pCO2 arterial: 31.8 mmHg — ABNORMAL LOW (ref 32.0–48.0)
pCO2 arterial: 48.5 mmHg — ABNORMAL HIGH (ref 32.0–48.0)
pCO2 arterial: 43.5 mmHg (ref 32.0–48.0)
pH, Arterial: 7.504 — ABNORMAL HIGH (ref 7.350–7.450)
pH, Arterial: 7.316 — ABNORMAL LOW (ref 7.350–7.450)
pH, Arterial: 7.317 — ABNORMAL LOW (ref 7.350–7.450)
pO2, Arterial: 101 mmHg (ref 83.0–108.0)
pO2, Arterial: 70 mmHg — ABNORMAL LOW (ref 83.0–108.0)
pO2, Arterial: 138 mmHg — ABNORMAL HIGH (ref 83.0–108.0)

## 2019-09-19 LAB — PROTIME-INR
INR: 1.2 (ref 0.8–1.2)
INR: 1.7 — ABNORMAL HIGH (ref 0.8–1.2)
Prothrombin Time: 15.3 seconds — ABNORMAL HIGH (ref 11.4–15.2)
Prothrombin Time: 20.2 seconds — ABNORMAL HIGH (ref 11.4–15.2)

## 2019-09-19 LAB — BASIC METABOLIC PANEL
Anion gap: 15 (ref 5–15)
Anion gap: 9 (ref 5–15)
BUN: 32 mg/dL — ABNORMAL HIGH (ref 8–23)
BUN: 23 mg/dL (ref 8–23)
CO2: 20 mmol/L — ABNORMAL LOW (ref 22–32)
CO2: 21 mmol/L — ABNORMAL LOW (ref 22–32)
Calcium: 8.7 mg/dL — ABNORMAL LOW (ref 8.9–10.3)
Calcium: 7.8 mg/dL — ABNORMAL LOW (ref 8.9–10.3)
Chloride: 101 mmol/L (ref 98–111)
Chloride: 106 mmol/L (ref 98–111)
Creatinine, Ser: 1.09 mg/dL (ref 0.61–1.24)
Creatinine, Ser: 1.22 mg/dL (ref 0.61–1.24)
GFR calc Af Amer: >60 mL/min (ref >60)
GFR calc Af Amer: >60 mL/min (ref >60)
GFR calc non Af Amer: >60 mL/min (ref >60)
GFR calc non Af Amer: 58 mL/min — ABNORMAL LOW (ref >60)
Glucose, Bld: 128 mg/dL — ABNORMAL HIGH (ref 70–99)
Glucose, Bld: 152 mg/dL — ABNORMAL HIGH (ref 70–99)
Potassium: 3.6 mmol/L (ref 3.5–5.1)
Potassium: 4 mmol/L (ref 3.5–5.1)
Sodium: 136 mmol/L (ref 135–145)
Sodium: 136 mmol/L (ref 135–145)

## 2019-09-19 LAB — CBC
HCT: 31.5 % — ABNORMAL LOW (ref 39.0–52.0)
HCT: 27.2 % — ABNORMAL LOW (ref 39.0–52.0)
Hemoglobin: 10.8 g/dL — ABNORMAL LOW (ref 13.0–17.0)
Hemoglobin: 9.3 g/dL — ABNORMAL LOW (ref 13.0–17.0)
MCH: 31.4 pg (ref 26.0–34.0)
MCH: 31 pg (ref 26.0–34.0)
MCHC: 34.3 g/dL (ref 30.0–36.0)
MCHC: 34.2 g/dL (ref 30.0–36.0)
MCV: 91.6 fL (ref 80.0–100.0)
MCV: 90.7 fL (ref 80.0–100.0)
Platelets: 94 10*3/uL — ABNORMAL LOW (ref 150–400)
Platelets: 103 10*3/uL — ABNORMAL LOW (ref 150–400)
RBC: 3.44 MIL/uL — ABNORMAL LOW (ref 4.22–5.81)
RBC: 3 MIL/uL — ABNORMAL LOW (ref 4.22–5.81)
RDW: 13.2 % (ref 11.5–15.5)
RDW: 13.8 % (ref 11.5–15.5)
WBC: 13.2 10*3/uL — ABNORMAL HIGH (ref 4.0–10.5)
WBC: 10.1 10*3/uL (ref 4.0–10.5)
nRBC: 0 % (ref 0.0–0.2)
nRBC: 0 % (ref 0.0–0.2)

## 2019-09-19 LAB — CBC WITH DIFFERENTIAL/PLATELET
Abs Immature Granulocytes: 0.03 10*3/uL (ref 0.00–0.07)
Basophils Absolute: 0 10*3/uL (ref 0.0–0.1)
Basophils Relative: 0 %
Eosinophils Absolute: 0 10*3/uL (ref 0.0–0.5)
Eosinophils Relative: 0 %
HCT: 37.9 % — ABNORMAL LOW (ref 39.0–52.0)
Hemoglobin: 13 g/dL (ref 13.0–17.0)
Immature Granulocytes: 0 %
Lymphocytes Relative: 15 %
Lymphs Abs: 1.3 10*3/uL (ref 0.7–4.0)
MCH: 30.8 pg (ref 26.0–34.0)
MCHC: 34.3 g/dL (ref 30.0–36.0)
MCV: 89.8 fL (ref 80.0–100.0)
Monocytes Absolute: 0.8 10*3/uL (ref 0.1–1.0)
Monocytes Relative: 9 %
Neutro Abs: 6.8 10*3/uL (ref 1.7–7.7)
Neutrophils Relative %: 76 %
Platelets: 136 10*3/uL — ABNORMAL LOW (ref 150–400)
RBC: 4.22 MIL/uL (ref 4.22–5.81)
RDW: 13.4 % (ref 11.5–15.5)
WBC: 8.9 10*3/uL (ref 4.0–10.5)
nRBC: 0 % (ref 0.0–0.2)

## 2019-09-19 LAB — GLUCOSE, CAPILLARY
Glucose-Capillary: 132 mg/dL — ABNORMAL HIGH (ref 70–99)
Glucose-Capillary: 144 mg/dL — ABNORMAL HIGH (ref 70–99)
Glucose-Capillary: 136 mg/dL — ABNORMAL HIGH (ref 70–99)
Glucose-Capillary: 121 mg/dL — ABNORMAL HIGH (ref 70–99)
Glucose-Capillary: 141 mg/dL — ABNORMAL HIGH (ref 70–99)
Glucose-Capillary: 154 mg/dL — ABNORMAL HIGH (ref 70–99)
Glucose-Capillary: 144 mg/dL — ABNORMAL HIGH (ref 70–99)
Glucose-Capillary: 159 mg/dL — ABNORMAL HIGH (ref 70–99)
Glucose-Capillary: 175 mg/dL — ABNORMAL HIGH (ref 70–99)
Glucose-Capillary: 158 mg/dL — ABNORMAL HIGH (ref 70–99)

## 2019-09-19 LAB — LACTATE DEHYDROGENASE
LDH: 538 U/L — ABNORMAL HIGH (ref 98–192)
LDH: 639 U/L — ABNORMAL HIGH (ref 98–192)

## 2019-09-19 LAB — APTT
aPTT: 48 seconds — ABNORMAL HIGH (ref 24–36)
aPTT: 36 seconds (ref 24–36)

## 2019-09-19 LAB — LIPID PANEL
Cholesterol: 148 mg/dL (ref 0–200)
HDL: 43 mg/dL (ref >40)
LDL Cholesterol: 95 mg/dL (ref 0–99)
Total CHOL/HDL Ratio: 3.4 RATIO
Triglycerides: 50 mg/dL (ref <150)
VLDL: 10 mg/dL (ref 0–40)

## 2019-09-19 LAB — HEMOGLOBIN AND HEMATOCRIT, BLOOD
HCT: 22.4 % — ABNORMAL LOW (ref 39.0–52.0)
Hemoglobin: 7.8 g/dL — ABNORMAL LOW (ref 13.0–17.0)

## 2019-09-19 LAB — PLATELET COUNT: Platelets: 94 10*3/uL — ABNORMAL LOW (ref 150–400)

## 2019-09-19 LAB — ECHO INTRAOPERATIVE TEE
Height: 65.5 in
Weight: 2606.72 oz

## 2019-09-19 LAB — PREPARE RBC (CROSSMATCH)

## 2019-09-19 LAB — HEPARIN LEVEL (UNFRACTIONATED): Heparin Unfractionated: <0.1 IU/mL — ABNORMAL LOW (ref 0.30–0.70)

## 2019-09-19 LAB — FIBRINOGEN: Fibrinogen: 223 mg/dL (ref 210–475)

## 2019-09-19 LAB — LACTIC ACID, PLASMA: Lactic Acid, Venous: 1 mmol/L (ref 0.5–1.9)

## 2019-09-19 SURGERY — CORONARY ARTERY BYPASS GRAFTING (CABG)
Anesthesia: General | Site: Leg Upper | Laterality: Right

## 2019-09-19 MED ORDER — STERILE WATER FOR INJECTION IJ SOLN
INTRAMUSCULAR | Status: AC
  Filled 2019-09-19: qty 10

## 2019-09-19 MED ORDER — HEPARIN SODIUM (PORCINE) 1000 UNIT/ML IJ SOLN
INTRAMUSCULAR | Status: DC | PRN
  Administered 2019-09-19: 24000 [IU] via INTRAVENOUS

## 2019-09-19 MED ORDER — MAGNESIUM SULFATE 4 GM/100ML IV SOLN
INTRAVENOUS | Status: AC
  Administered 2019-09-19: 4 g via INTRAVENOUS
  Filled 2019-09-19: qty 100

## 2019-09-19 MED ORDER — ALBUMIN HUMAN 5 % IV SOLN
250.0000 mL | INTRAVENOUS | Status: AC | PRN
  Administered 2019-09-20: 12.5 g via INTRAVENOUS
  Filled 2019-09-19: qty 500

## 2019-09-19 MED ORDER — ETOMIDATE 2 MG/ML IV SOLN
INTRAVENOUS | Status: AC
  Filled 2019-09-19: qty 10

## 2019-09-19 MED ORDER — FAMOTIDINE IN NACL 20-0.9 MG/50ML-% IV SOLN
20.0000 mg | Freq: Two times a day (BID) | INTRAVENOUS | Status: AC
  Administered 2019-09-20: 20 mg via INTRAVENOUS

## 2019-09-19 MED ORDER — LACTATED RINGERS IV SOLN: INTRAVENOUS | Status: DC | PRN

## 2019-09-19 MED ORDER — CHLORHEXIDINE GLUCONATE 0.12 % MT SOLN
15.0000 mL | Freq: Two times a day (BID) | OROMUCOSAL | Status: DC
  Administered 2019-09-19 – 2019-09-20 (×3): 15 mL via OROMUCOSAL

## 2019-09-19 MED ORDER — ACETAMINOPHEN 500 MG PO TABS
1000.0000 mg | ORAL_TABLET | Freq: Four times a day (QID) | ORAL | Status: AC
  Administered 2019-09-21 – 2019-09-24 (×14): 1000 mg via ORAL
  Filled 2019-09-19 (×14): qty 2

## 2019-09-19 MED ORDER — ALBUMIN HUMAN 5 % IV SOLN
250.0000 mL | INTRAVENOUS | Status: AC | PRN
  Administered 2019-09-19 (×4): 12.5 g via INTRAVENOUS
  Filled 2019-09-19: qty 500

## 2019-09-19 MED ORDER — HEPARIN SODIUM (PORCINE) 1000 UNIT/ML IJ SOLN
INTRAMUSCULAR | Status: AC
  Filled 2019-09-19: qty 1

## 2019-09-19 MED ORDER — PANTOPRAZOLE SODIUM 40 MG PO TBEC
40.0000 mg | DELAYED_RELEASE_TABLET | Freq: Every day | ORAL | Status: DC
  Administered 2019-09-21 – 2019-10-01 (×11): 40 mg via ORAL
  Filled 2019-09-19 (×10): qty 1

## 2019-09-19 MED ORDER — BUPIVACAINE LIPOSOME 1.3 % IJ SUSP
20.0000 mL | INTRAMUSCULAR | Status: AC
  Filled 2019-09-19: qty 20

## 2019-09-19 MED ORDER — SODIUM CHLORIDE 0.9 % IV SOLN: INTRAVENOUS | Status: DC

## 2019-09-19 MED ORDER — LIDOCAINE 2% (20 MG/ML) 5 ML SYRINGE
INTRAMUSCULAR | Status: AC
  Filled 2019-09-19: qty 10

## 2019-09-19 MED ORDER — CALCIUM CHLORIDE 10 % IV SOLN
INTRAVENOUS | Status: DC | PRN
  Administered 2019-09-19 (×3): 200 mg via INTRAVENOUS
  Administered 2019-09-19: 100 mg via INTRAVENOUS
  Administered 2019-09-19: 200 mg via INTRAVENOUS
  Administered 2019-09-19: 100 mg via INTRAVENOUS

## 2019-09-19 MED ORDER — MAGNESIUM SULFATE 4 GM/100ML IV SOLN: 4.0000 g | Freq: Once | INTRAVENOUS | Status: AC

## 2019-09-19 MED ORDER — LIDOCAINE 2% (20 MG/ML) 5 ML SYRINGE
INTRAMUSCULAR | Status: DC | PRN
  Administered 2019-09-19: 40 mg via INTRAVENOUS

## 2019-09-19 MED ORDER — ARTIFICIAL TEARS OPHTHALMIC OINT
TOPICAL_OINTMENT | OPHTHALMIC | Status: AC
  Filled 2019-09-19: qty 3.5

## 2019-09-19 MED ORDER — POTASSIUM CHLORIDE 10 MEQ/50ML IV SOLN
10.0000 meq | INTRAVENOUS | Status: DC
  Administered 2019-09-19 (×3): 10 meq via INTRAVENOUS
  Filled 2019-09-19 (×2): qty 50

## 2019-09-19 MED ORDER — POTASSIUM CHLORIDE 10 MEQ/50ML IV SOLN
10.0000 meq | INTRAVENOUS | Status: AC
  Administered 2019-09-19 (×3): 10 meq via INTRAVENOUS

## 2019-09-19 MED ORDER — METOPROLOL TARTRATE 25 MG/10 ML ORAL SUSPENSION: 12.5000 mg | Freq: Two times a day (BID) | ORAL | Status: DC

## 2019-09-19 MED ORDER — ALBUMIN HUMAN 5 % IV SOLN: INTRAVENOUS | Status: DC | PRN

## 2019-09-19 MED ORDER — SODIUM CHLORIDE 0.9% FLUSH
10.0000 mL | Freq: Two times a day (BID) | INTRAVENOUS | Status: DC
  Administered 2019-09-19 – 2019-09-21 (×4): 10 mL
  Administered 2019-09-22: 40 mL
  Administered 2019-09-22 – 2019-09-24 (×4): 20 mL
  Administered 2019-09-25: 10 mL

## 2019-09-19 MED ORDER — SODIUM CHLORIDE 0.9 % IV SOLN
1.5000 g | Freq: Two times a day (BID) | INTRAVENOUS | Status: AC
  Administered 2019-09-19 – 2019-09-21 (×4): 1.5 g via INTRAVENOUS
  Filled 2019-09-19 (×6): qty 1.5

## 2019-09-19 MED ORDER — OXYCODONE HCL 5 MG PO TABS
5.0000 mg | ORAL_TABLET | ORAL | Status: DC | PRN
  Administered 2019-09-21: 10 mg via ORAL
  Administered 2019-09-22: 5 mg via ORAL
  Filled 2019-09-19: qty 2
  Filled 2019-09-19: qty 1
  Filled 2019-09-19: qty 2

## 2019-09-19 MED ORDER — SODIUM CHLORIDE 0.45 % IV SOLN: INTRAVENOUS | Status: DC | PRN

## 2019-09-19 MED ORDER — ETOMIDATE 2 MG/ML IV SOLN
INTRAVENOUS | Status: DC | PRN
  Administered 2019-09-19: 8 mg via INTRAVENOUS

## 2019-09-19 MED ORDER — FENTANYL CITRATE (PF) 250 MCG/5ML IJ SOLN
INTRAMUSCULAR | Status: DC | PRN
  Administered 2019-09-19 (×3): 100 ug via INTRAVENOUS
  Administered 2019-09-19: 150 ug via INTRAVENOUS
  Administered 2019-09-19 (×2): 100 ug via INTRAVENOUS
  Administered 2019-09-19: 50 ug via INTRAVENOUS
  Administered 2019-09-19: 100 ug via INTRAVENOUS
  Administered 2019-09-19 (×2): 50 ug via INTRAVENOUS
  Administered 2019-09-19: 100 ug via INTRAVENOUS

## 2019-09-19 MED ORDER — PHENYLEPHRINE 40 MCG/ML (10ML) SYRINGE FOR IV PUSH (FOR BLOOD PRESSURE SUPPORT)
PREFILLED_SYRINGE | INTRAVENOUS | Status: AC
  Filled 2019-09-19: qty 40

## 2019-09-19 MED ORDER — CHLORHEXIDINE GLUCONATE 0.12 % MT SOLN: 15.0000 mL | OROMUCOSAL | Status: AC

## 2019-09-19 MED ORDER — NITROGLYCERIN IN D5W 200-5 MCG/ML-% IV SOLN: 0.0000 ug/min | INTRAVENOUS | Status: DC

## 2019-09-19 MED ORDER — VASOPRESSIN 20 UNIT/ML IV SOLN
0.0300 [IU]/min | INTRAVENOUS | Status: DC
  Administered 2019-09-20: 0.05 [IU]/min via INTRAVENOUS
  Administered 2019-09-20: 0.03 [IU]/min via INTRAVENOUS
  Filled 2019-09-19 (×2): qty 2

## 2019-09-19 MED ORDER — ASPIRIN EC 325 MG PO TBEC
325.0000 mg | DELAYED_RELEASE_TABLET | Freq: Every day | ORAL | Status: DC
  Administered 2019-09-21 – 2019-09-30 (×10): 325 mg via ORAL
  Filled 2019-09-19 (×11): qty 1

## 2019-09-19 MED ORDER — SODIUM CHLORIDE 0.9% FLUSH: 3.0000 mL | INTRAVENOUS | Status: DC | PRN

## 2019-09-19 MED ORDER — CHLORHEXIDINE GLUCONATE CLOTH 2 % EX PADS
6.0000 | MEDICATED_PAD | Freq: Every day | CUTANEOUS | Status: DC
  Administered 2019-09-19 – 2019-10-01 (×9): 6 via TOPICAL

## 2019-09-19 MED ORDER — METOPROLOL TARTRATE 5 MG/5ML IV SOLN: 2.5000 mg | INTRAVENOUS | Status: DC | PRN

## 2019-09-19 MED ORDER — METOPROLOL TARTRATE 12.5 MG HALF TABLET: 12.5000 mg | ORAL_TABLET | Freq: Two times a day (BID) | ORAL | Status: DC

## 2019-09-19 MED ORDER — LACTATED RINGERS IV SOLN: INTRAVENOUS | Status: DC

## 2019-09-19 MED ORDER — MILRINONE LACTATE IN DEXTROSE 20-5 MG/100ML-% IV SOLN
0.1250 ug/kg/min | INTRAVENOUS | Status: DC
  Administered 2019-09-19 – 2019-09-26 (×12): 0.25 ug/kg/min via INTRAVENOUS
  Administered 2019-09-27: 0.125 ug/kg/min via INTRAVENOUS
  Administered 2019-09-27: 0.25 ug/kg/min via INTRAVENOUS
  Administered 2019-09-29: 0.125 ug/kg/min via INTRAVENOUS
  Filled 2019-09-19 (×13): qty 100

## 2019-09-19 MED ORDER — HEMOSTATIC AGENTS (NO CHARGE) OPTIME
TOPICAL | Status: DC | PRN
  Administered 2019-09-19 (×4): 1 via TOPICAL

## 2019-09-19 MED ORDER — 0.9 % SODIUM CHLORIDE (POUR BTL) OPTIME
TOPICAL | Status: DC | PRN
  Administered 2019-09-19: 11:00:00 5000 mL

## 2019-09-19 MED ORDER — NOREPINEPHRINE 16 MG/250ML-% IV SOLN
0.0000 ug/min | INTRAVENOUS | Status: DC
  Administered 2019-09-19: 39 ug/min via INTRAVENOUS
  Administered 2019-09-20: 15 ug/min via INTRAVENOUS
  Administered 2019-09-20: 26 ug/min via INTRAVENOUS
  Administered 2019-09-23: 6 ug/min via INTRAVENOUS
  Filled 2019-09-19 (×4): qty 250

## 2019-09-19 MED ORDER — ARTIFICIAL TEARS OPHTHALMIC OINT
TOPICAL_OINTMENT | OPHTHALMIC | Status: DC | PRN
  Administered 2019-09-19: 1 via OPHTHALMIC

## 2019-09-19 MED ORDER — ONDANSETRON HCL 4 MG/2ML IJ SOLN: 4.0000 mg | Freq: Four times a day (QID) | INTRAMUSCULAR | Status: DC | PRN

## 2019-09-19 MED ORDER — AMIODARONE IV BOLUS ONLY 150 MG/100ML
INTRAVENOUS | Status: DC | PRN
  Administered 2019-09-19: 150 mg via INTRAVENOUS

## 2019-09-19 MED ORDER — VANCOMYCIN HCL IN DEXTROSE 1-5 GM/200ML-% IV SOLN
1000.0000 mg | Freq: Once | INTRAVENOUS | Status: AC
  Administered 2019-09-19: 1000 mg via INTRAVENOUS
  Filled 2019-09-19: qty 200

## 2019-09-19 MED ORDER — STERILE WATER FOR INJECTION IJ SOLN
INTRAMUSCULAR | Status: DC | PRN
  Administered 2019-09-19: 10 mL

## 2019-09-19 MED ORDER — DOCUSATE SODIUM 100 MG PO CAPS
200.0000 mg | ORAL_CAPSULE | Freq: Every day | ORAL | Status: DC
  Administered 2019-09-21 – 2019-09-27 (×4): 200 mg via ORAL
  Filled 2019-09-19 (×7): qty 2

## 2019-09-19 MED ORDER — SODIUM CHLORIDE 0.9 % IV SOLN: 250.0000 mL | INTRAVENOUS | Status: DC

## 2019-09-19 MED ORDER — FAMOTIDINE IN NACL 20-0.9 MG/50ML-% IV SOLN
INTRAVENOUS | Status: AC
  Administered 2019-09-19: 20 mg via INTRAVENOUS
  Filled 2019-09-19: qty 50

## 2019-09-19 MED ORDER — NOREPINEPHRINE 4 MG/250ML-% IV SOLN
INTRAVENOUS | Status: AC
  Filled 2019-09-19: qty 250

## 2019-09-19 MED ORDER — MIDAZOLAM HCL 5 MG/5ML IJ SOLN
INTRAMUSCULAR | Status: DC | PRN
  Administered 2019-09-19 (×3): 2 mg via INTRAVENOUS
  Administered 2019-09-19: 4 mg via INTRAVENOUS
  Administered 2019-09-19: 2 mg via INTRAVENOUS

## 2019-09-19 MED ORDER — ACETAMINOPHEN 160 MG/5ML PO SOLN: 650.0000 mg | Freq: Once | ORAL | Status: AC

## 2019-09-19 MED ORDER — TRAMADOL HCL 50 MG PO TABS: 50.0000 mg | ORAL_TABLET | ORAL | Status: DC | PRN

## 2019-09-19 MED ORDER — VASOPRESSIN 20 UNIT/ML IV SOLN
INTRAVENOUS | Status: DC | PRN
  Administered 2019-09-19 (×3): 1 [IU] via INTRAVENOUS

## 2019-09-19 MED ORDER — ACETAMINOPHEN 650 MG RE SUPP
650.0000 mg | Freq: Once | RECTAL | Status: AC
  Administered 2019-09-19: 650 mg via RECTAL

## 2019-09-19 MED ORDER — MIDAZOLAM HCL (PF) 10 MG/2ML IJ SOLN
INTRAMUSCULAR | Status: AC
  Filled 2019-09-19: qty 2

## 2019-09-19 MED ORDER — DEXMEDETOMIDINE HCL IN NACL 400 MCG/100ML IV SOLN
0.0000 ug/kg/h | INTRAVENOUS | Status: DC
  Administered 2019-09-19: 0.5 ug/kg/h via INTRAVENOUS
  Administered 2019-09-20 (×2): 0.7 ug/kg/h via INTRAVENOUS
  Filled 2019-09-19 (×2): qty 100

## 2019-09-19 MED ORDER — EPINEPHRINE PF 1 MG/ML IJ SOLN
0.0000 ug/min | INTRAVENOUS | Status: DC
  Administered 2019-09-19: 8 ug/min via INTRAVENOUS
  Administered 2019-09-20: 10 ug/min via INTRAVENOUS
  Administered 2019-09-20 – 2019-09-21 (×2): 5 ug/min via INTRAVENOUS
  Administered 2019-09-21: 1.5 ug/min via INTRAVENOUS
  Filled 2019-09-19 (×11): qty 4

## 2019-09-19 MED ORDER — ROCURONIUM BROMIDE 10 MG/ML (PF) SYRINGE
PREFILLED_SYRINGE | INTRAVENOUS | Status: DC | PRN
  Administered 2019-09-19 (×4): 50 mg via INTRAVENOUS
  Administered 2019-09-19: 100 mg via INTRAVENOUS

## 2019-09-19 MED ORDER — NOREPINEPHRINE 16 MG/250ML-% IV SOLN
0.0000 ug/min | INTRAVENOUS | Status: DC
  Administered 2019-09-19: 27 ug/min via INTRAVENOUS
  Filled 2019-09-19: qty 250

## 2019-09-19 MED ORDER — SODIUM CHLORIDE 0.9% FLUSH: 10.0000 mL | INTRAVENOUS | Status: DC | PRN

## 2019-09-19 MED ORDER — FENTANYL CITRATE (PF) 250 MCG/5ML IJ SOLN
INTRAMUSCULAR | Status: AC
  Filled 2019-09-19: qty 25

## 2019-09-19 MED ORDER — SODIUM CHLORIDE 0.9 % IV SOLN: 250.0000 mL | INTRAVENOUS | Status: DC | PRN

## 2019-09-19 MED ORDER — PLASMA-LYTE 148 IV SOLN
INTRAVENOUS | Status: DC | PRN
  Administered 2019-09-19: 12:00:00 500 mL

## 2019-09-19 MED ORDER — AMIODARONE HCL IN DEXTROSE 360-4.14 MG/200ML-% IV SOLN
INTRAVENOUS | Status: DC | PRN
  Administered 2019-09-19: 60 mg/h via INTRAVENOUS

## 2019-09-19 MED ORDER — PHENYLEPHRINE HCL-NACL 20-0.9 MG/250ML-% IV SOLN: 0.0000 ug/min | INTRAVENOUS | Status: DC

## 2019-09-19 MED ORDER — ASPIRIN 81 MG PO CHEW
324.0000 mg | CHEWABLE_TABLET | Freq: Every day | ORAL | Status: DC
  Administered 2019-09-20 – 2019-10-01 (×2): 324 mg
  Filled 2019-09-19 (×2): qty 4

## 2019-09-19 MED ORDER — BISACODYL 10 MG RE SUPP
10.0000 mg | Freq: Every day | RECTAL | Status: DC
  Administered 2019-09-20: 10 mg via RECTAL
  Filled 2019-09-19: qty 1

## 2019-09-19 MED ORDER — MIDAZOLAM HCL 2 MG/2ML IJ SOLN
2.0000 mg | INTRAMUSCULAR | Status: DC | PRN
  Administered 2019-09-19: 2 mg via INTRAVENOUS
  Filled 2019-09-19: qty 2

## 2019-09-19 MED ORDER — VASOPRESSIN 20 UNIT/ML IV SOLN
INTRAVENOUS | Status: AC
  Filled 2019-09-19: qty 1

## 2019-09-19 MED ORDER — MIDAZOLAM HCL 2 MG/2ML IJ SOLN
INTRAMUSCULAR | Status: AC
  Filled 2019-09-19: qty 2

## 2019-09-19 MED ORDER — DEXTROSE 50 % IV SOLN: 0.0000 mL | INTRAVENOUS | Status: DC | PRN

## 2019-09-19 MED ORDER — PHENYLEPHRINE 40 MCG/ML (10ML) SYRINGE FOR IV PUSH (FOR BLOOD PRESSURE SUPPORT)
PREFILLED_SYRINGE | INTRAVENOUS | Status: DC | PRN
  Administered 2019-09-19 (×2): 80 ug via INTRAVENOUS
  Administered 2019-09-19: 120 ug via INTRAVENOUS
  Administered 2019-09-19 (×4): 80 ug via INTRAVENOUS
  Administered 2019-09-19: 120 ug via INTRAVENOUS
  Administered 2019-09-19: 40 ug via INTRAVENOUS

## 2019-09-19 MED ORDER — VASOPRESSIN 20 UNIT/ML IV SOLN
0.0300 [IU]/min | INTRAVENOUS | Status: DC
  Administered 2019-09-19: .03 [IU]/min via INTRAVENOUS
  Filled 2019-09-19: qty 2

## 2019-09-19 MED ORDER — MORPHINE SULFATE (PF) 2 MG/ML IV SOLN
1.0000 mg | INTRAVENOUS | Status: DC | PRN
  Administered 2019-09-19 – 2019-09-20 (×3): 2 mg via INTRAVENOUS
  Administered 2019-09-20 (×4): 4 mg via INTRAVENOUS
  Administered 2019-09-20 – 2019-09-21 (×5): 2 mg via INTRAVENOUS
  Filled 2019-09-19: qty 1
  Filled 2019-09-19 (×2): qty 2
  Filled 2019-09-19: qty 1
  Filled 2019-09-19 (×2): qty 2
  Filled 2019-09-19: qty 1
  Filled 2019-09-19: qty 2
  Filled 2019-09-19: qty 1
  Filled 2019-09-19: qty 2
  Filled 2019-09-19: qty 1

## 2019-09-19 MED ORDER — DEXTROSE 5 % SOLN FOR IMPELLA PURGE CATHETER
INTRAVENOUS | Status: DC
  Administered 2019-09-19: 500 mL
  Filled 2019-09-19 (×4): qty 1000

## 2019-09-19 MED ORDER — BUPIVACAINE HCL (PF) 0.5 % IJ SOLN
INTRAMUSCULAR | Status: AC
  Filled 2019-09-19: qty 30

## 2019-09-19 MED ORDER — VANCOMYCIN HCL 1000 MG IV SOLR
INTRAVENOUS | Status: AC
  Filled 2019-09-19: qty 3000

## 2019-09-19 MED ORDER — PROPOFOL 10 MG/ML IV BOLUS
INTRAVENOUS | Status: AC
  Filled 2019-09-19: qty 20

## 2019-09-19 MED ORDER — AMIODARONE HCL IN DEXTROSE 360-4.14 MG/200ML-% IV SOLN
INTRAVENOUS | Status: AC
  Administered 2019-09-19: 60 mg/h
  Filled 2019-09-19: qty 200

## 2019-09-19 MED ORDER — NOREPINEPHRINE 4 MG/250ML-% IV SOLN: 0.0000 ug/min | INTRAVENOUS | Status: DC

## 2019-09-19 MED ORDER — CHLORHEXIDINE GLUCONATE 0.12% ORAL RINSE (MEDLINE KIT)
15.0000 mL | Freq: Two times a day (BID) | OROMUCOSAL | Status: DC
  Administered 2019-09-20 – 2019-09-29 (×14): 15 mL via OROMUCOSAL

## 2019-09-19 MED ORDER — ACETAMINOPHEN 160 MG/5ML PO SOLN
1000.0000 mg | Freq: Four times a day (QID) | ORAL | Status: AC
  Administered 2019-09-20 (×3): 1000 mg
  Filled 2019-09-19 (×3): qty 40.6

## 2019-09-19 MED ORDER — LACTATED RINGERS IV SOLN
500.0000 mL | Freq: Once | INTRAVENOUS | Status: AC | PRN
  Administered 2019-09-19: 500 mL via INTRAVENOUS

## 2019-09-19 MED ORDER — INSULIN REGULAR(HUMAN) IN NACL 100-0.9 UT/100ML-% IV SOLN: INTRAVENOUS | Status: DC

## 2019-09-19 MED ORDER — ROCURONIUM BROMIDE 10 MG/ML (PF) SYRINGE
PREFILLED_SYRINGE | INTRAVENOUS | Status: AC
  Filled 2019-09-19: qty 20

## 2019-09-19 MED ORDER — BISACODYL 5 MG PO TBEC
10.0000 mg | DELAYED_RELEASE_TABLET | Freq: Every day | ORAL | Status: DC
  Administered 2019-09-21 – 2019-09-27 (×4): 10 mg via ORAL
  Filled 2019-09-19 (×6): qty 2

## 2019-09-19 MED ORDER — ASPIRIN 81 MG PO CHEW: 81.0000 mg | CHEWABLE_TABLET | ORAL | Status: DC

## 2019-09-19 MED ORDER — VANCOMYCIN HCL 1000 MG IV SOLR
INTRAVENOUS | Status: DC | PRN
  Administered 2019-09-19: 3000 mg via TOPICAL

## 2019-09-19 MED ORDER — ORAL CARE MOUTH RINSE
15.0000 mL | OROMUCOSAL | Status: DC
  Administered 2019-09-19 – 2019-09-20 (×9): 15 mL via OROMUCOSAL

## 2019-09-19 MED ORDER — ORAL CARE MOUTH RINSE
15.0000 mL | Freq: Two times a day (BID) | OROMUCOSAL | Status: DC
  Administered 2019-09-19: 15 mL via OROMUCOSAL

## 2019-09-19 MED ORDER — AMIODARONE HCL IN DEXTROSE 360-4.14 MG/200ML-% IV SOLN
INTRAVENOUS | Status: AC
  Filled 2019-09-19: qty 200

## 2019-09-19 MED ORDER — SODIUM CHLORIDE (PF) 0.9 % IJ SOLN
INTRAMUSCULAR | Status: AC
  Filled 2019-09-19: qty 10

## 2019-09-19 MED ORDER — SODIUM CHLORIDE 0.9% FLUSH
3.0000 mL | Freq: Two times a day (BID) | INTRAVENOUS | Status: DC
  Administered 2019-09-20 – 2019-09-25 (×7): 3 mL via INTRAVENOUS

## 2019-09-19 SURGICAL SUPPLY — 117 items
ADAPTER CARDIO PERF ANTE/RETRO (ADAPTER) ×4 IMPLANT
BAG DECANTER FOR FLEXI CONT (MISCELLANEOUS) ×4 IMPLANT
BASKET HEART (ORDER IN 25'S) (MISCELLANEOUS) ×1
BASKET HEART (ORDER IN 25S) (MISCELLANEOUS) ×3 IMPLANT
BLADE CLIPPER SURG (BLADE) ×4 IMPLANT
BLADE STERNUM SYSTEM 6 (BLADE) ×4 IMPLANT
BNDG ELASTIC 4X5.8 VLCR STR LF (GAUZE/BANDAGES/DRESSINGS) ×4 IMPLANT
BNDG ELASTIC 6X5.8 VLCR STR LF (GAUZE/BANDAGES/DRESSINGS) ×4 IMPLANT
BNDG GAUZE ELAST 4 BULKY (GAUZE/BANDAGES/DRESSINGS) ×4 IMPLANT
CANISTER SUCT 3000ML PPV (MISCELLANEOUS) ×4 IMPLANT
CANN PRFSN 3/8XCNCT ST RT ANG (MISCELLANEOUS) ×3
CANN PRFSN 3/8XRT ANG TPR 14 (MISCELLANEOUS) ×3
CANNULA NON VENT 20FR 12 (CANNULA) ×1 IMPLANT
CANNULA PRFSN 3/8XCNCT RT ANG (MISCELLANEOUS) IMPLANT
CANNULA PRFSN 3/8XRT ANG TPR14 (MISCELLANEOUS) IMPLANT
CANNULA VEN MTL TIP RT (MISCELLANEOUS) ×2
CATH CPB KIT HENDRICKSON (MISCELLANEOUS) ×4 IMPLANT
CATH RETROPLEGIA CORONARY 14FR (CATHETERS) ×1 IMPLANT
CATH ROBINSON RED A/P 18FR (CATHETERS) ×10 IMPLANT
CLIP FOGARTY SPRING 6M (CLIP) ×1 IMPLANT
CLIP RETRACTION 3.0MM CORONARY (MISCELLANEOUS) ×4 IMPLANT
CLIP VESOCCLUDE SM WIDE 24/CT (CLIP) ×2 IMPLANT
CONN 1/2X1/2X1/2  BEN (MISCELLANEOUS) ×1
CONN 1/2X1/2X1/2 BEN (MISCELLANEOUS) IMPLANT
CONN 3/8X1/2 ST GISH (MISCELLANEOUS) ×2 IMPLANT
CONN ST 1/4X3/8  BEN (MISCELLANEOUS) ×2
CONN ST 1/4X3/8 BEN (MISCELLANEOUS) IMPLANT
COVER PROBE W GEL 5X96 (DRAPES) ×1 IMPLANT
DERMABOND ADVANCED (GAUZE/BANDAGES/DRESSINGS) ×2
DERMABOND ADVANCED .7 DNX12 (GAUZE/BANDAGES/DRESSINGS) ×3 IMPLANT
DRAIN CHANNEL 28F RND 3/8 FF (WOUND CARE) ×12 IMPLANT
DRAPE CARDIOVASCULAR INCISE (DRAPES) ×1
DRAPE SLUSH/WARMER DISC (DRAPES) ×4 IMPLANT
DRAPE SRG 135X102X78XABS (DRAPES) ×3 IMPLANT
DRSG AQUACEL AG ADV 3.5X14 (GAUZE/BANDAGES/DRESSINGS) ×4 IMPLANT
ELECT BLADE 4.0 EZ CLEAN MEGAD (MISCELLANEOUS) ×4
ELECT CAUTERY BLADE 6.4 (BLADE) ×3 IMPLANT
ELECT REM PT RETURN 9FT ADLT (ELECTROSURGICAL) ×8
ELECTRODE BLDE 4.0 EZ CLN MEGD (MISCELLANEOUS) IMPLANT
ELECTRODE REM PT RTRN 9FT ADLT (ELECTROSURGICAL) ×6 IMPLANT
FELT TEFLON 1X6 (MISCELLANEOUS) ×7 IMPLANT
GAUZE SPONGE 4X4 12PLY STRL (GAUZE/BANDAGES/DRESSINGS) ×8 IMPLANT
GAUZE SPONGE 4X4 12PLY STRL LF (GAUZE/BANDAGES/DRESSINGS) ×2 IMPLANT
GLOVE BIO SURGEON STRL SZ 6 (GLOVE) ×3 IMPLANT
GLOVE BIO SURGEON STRL SZ7.5 (GLOVE) ×2 IMPLANT
GLOVE BIOGEL PI IND STRL 6 (GLOVE) IMPLANT
GLOVE BIOGEL PI IND STRL 7.5 (GLOVE) IMPLANT
GLOVE BIOGEL PI IND STRL 8.5 (GLOVE) IMPLANT
GLOVE BIOGEL PI INDICATOR 6 (GLOVE) ×2
GLOVE BIOGEL PI INDICATOR 7.5 (GLOVE) ×2
GLOVE BIOGEL PI INDICATOR 8.5 (GLOVE) ×1
GLOVE NEODERM STRL 7.5 LF PF (GLOVE) ×9 IMPLANT
GLOVE SURG NEODERM 7.5  LF PF (GLOVE) ×3
GOWN STRL REUS W/ TWL LRG LVL3 (GOWN DISPOSABLE) ×12 IMPLANT
GOWN STRL REUS W/TWL LRG LVL3 (GOWN DISPOSABLE) ×11
HEMOSTAT POWDER SURGIFOAM 1G (HEMOSTASIS) ×8 IMPLANT
INSERT FOGARTY XLG (MISCELLANEOUS) ×1 IMPLANT
IV ADAPTER SYR DOUBLE MALE LL (MISCELLANEOUS) ×1 IMPLANT
KIT BASIN OR (CUSTOM PROCEDURE TRAY) ×4 IMPLANT
KIT SUCTION CATH 14FR (SUCTIONS) ×4 IMPLANT
KIT TURNOVER KIT B (KITS) ×4 IMPLANT
KIT VASOVIEW HEMOPRO 2 VH 4000 (KITS) ×4 IMPLANT
MARKER GRAFT CORONARY BYPASS (MISCELLANEOUS) ×12 IMPLANT
NDL 18GX1X1/2 (RX/OR ONLY) (NEEDLE) ×3 IMPLANT
NEEDLE 18GX1X1/2 (RX/OR ONLY) (NEEDLE) ×8 IMPLANT
NS IRRIG 1000ML POUR BTL (IV SOLUTION) ×20 IMPLANT
PACK E OPEN HEART (SUTURE) ×4 IMPLANT
PACK OPEN HEART (CUSTOM PROCEDURE TRAY) ×4 IMPLANT
PACK SPY-PHI (KITS) ×1 IMPLANT
PAD ARMBOARD 7.5X6 YLW CONV (MISCELLANEOUS) ×8 IMPLANT
PAD ELECT DEFIB RADIOL ZOLL (MISCELLANEOUS) ×4 IMPLANT
PENCIL BUTTON HOLSTER BLD 10FT (ELECTRODE) ×4 IMPLANT
POSITIONER HEAD DONUT 9IN (MISCELLANEOUS) ×4 IMPLANT
POWDER SURGICEL 3.0 GRAM (HEMOSTASIS) ×1 IMPLANT
PUNCH AORTIC ROTATE  4.5MM 8IN (MISCELLANEOUS) ×1 IMPLANT
SEALANT SURG COSEAL 8ML (VASCULAR PRODUCTS) ×1 IMPLANT
SET CARDIOPLEGIA MPS 5001102 (MISCELLANEOUS) ×1 IMPLANT
SPONGE LAP 18X18 RF (DISPOSABLE) ×1 IMPLANT
SUT BONE WAX W31G (SUTURE) ×4 IMPLANT
SUT ETHIBOND X763 2 0 SH 1 (SUTURE) ×2 IMPLANT
SUT MNCRL AB 3-0 PS2 18 (SUTURE) ×9 IMPLANT
SUT PDS AB 1 CTX 36 (SUTURE) ×8 IMPLANT
SUT PROLENE 3 0 SH DA (SUTURE) ×6 IMPLANT
SUT PROLENE 4 0 RB 1 (SUTURE) ×1
SUT PROLENE 4 0 SH DA (SUTURE) ×1 IMPLANT
SUT PROLENE 4-0 RB1 .5 CRCL 36 (SUTURE) IMPLANT
SUT PROLENE 5 0 C 1 36 (SUTURE) IMPLANT
SUT PROLENE 6 0 C 1 30 (SUTURE) ×11 IMPLANT
SUT PROLENE 7 0 BV 1 (SUTURE) ×1 IMPLANT
SUT PROLENE 7 0 BV1 MDA (SUTURE) ×1 IMPLANT
SUT PROLENE 8 0 BV175 6 (SUTURE) IMPLANT
SUT PROLENE BLUE 7 0 (SUTURE) ×4 IMPLANT
SUT SILK  1 MH (SUTURE) ×1
SUT SILK 1 MH (SUTURE) IMPLANT
SUT SILK 2 0 SH CR/8 (SUTURE) IMPLANT
SUT SILK 3 0 SH CR/8 (SUTURE) IMPLANT
SUT STEEL 6MS V (SUTURE) ×4 IMPLANT
SUT STEEL SZ 6 DBL 3X14 BALL (SUTURE) ×4 IMPLANT
SUT VIC AB 2-0 CT1 27 (SUTURE) ×1
SUT VIC AB 2-0 CT1 TAPERPNT 27 (SUTURE) IMPLANT
SUT VIC AB 2-0 CTX 27 (SUTURE) IMPLANT
SUT VIC AB 3-0 X1 27 (SUTURE) IMPLANT
SYR 10ML LL (SYRINGE) IMPLANT
SYR 30ML LL (SYRINGE) ×4 IMPLANT
SYR 3ML LL SCALE MARK (SYRINGE) ×4 IMPLANT
SYSTEM SAHARA CHEST DRAIN ATS (WOUND CARE) ×4 IMPLANT
TAPE CLOTH SURG 4X10 WHT LF (GAUZE/BANDAGES/DRESSINGS) ×1 IMPLANT
TAPE PAPER 2X10 WHT MICROPORE (GAUZE/BANDAGES/DRESSINGS) ×1 IMPLANT
TOWEL GREEN STERILE (TOWEL DISPOSABLE) ×4 IMPLANT
TOWEL GREEN STERILE FF (TOWEL DISPOSABLE) ×3 IMPLANT
TRAY FOLEY SLVR 16FR TEMP STAT (SET/KITS/TRAYS/PACK) ×4 IMPLANT
TUBING ART PRESS 48 MALE/FEM (TUBING) ×2 IMPLANT
TUBING LAP HI FLOW INSUFFLATIO (TUBING) ×4 IMPLANT
UNDERPAD 30X30 (UNDERPADS AND DIAPERS) ×4 IMPLANT
WATER STERILE IRR 1000ML POUR (IV SOLUTION) ×8 IMPLANT
WATER STERILE IRR 1000ML UROMA (IV SOLUTION) ×1 IMPLANT
YANKAUER SUCT BULB TIP NO VENT (SUCTIONS) ×2 IMPLANT

## 2019-09-19 NOTE — Progress Notes (Signed)
Echocardiogram Echocardiogram Transesophageal has been performed.  Dennis Roberts 09/19/2019, 10:31 AM

## 2019-09-19 NOTE — Progress Notes (Signed)
ANTICOAGULATION CONSULT NOTE  Pharmacy Consult for heparin Indication: Impella  Allergies  Allergen Reactions  . Penicillins   . Sulfa Antibiotics     Patient Measurements: Height: 5' 5.5" (166.4 cm) Weight: 162 lb 14.7 oz (73.9 kg) IBW/kg (Calculated) : 62.65 Heparin Dosing Weight: 70kg  Vital Signs: Temp: 98.8 F (37.1 C) (03/13 0230) Temp Source: Core (03/12 1915) BP: 105/93 (03/13 0100) Pulse Rate: 107 (03/13 0230)  Labs: Recent Labs    09/18/19 1300 09/18/19 1500 09/18/19 1520 09/18/19 1624 09/18/19 1624 09/18/19 2005 09/19/19 0238  HGB 15.2  --    < > 14.3   < > 13.2 13.0  HCT 46.0  --    < > 42.0  --  39.3 37.9*  PLT 179  --   --   --   --  155 136*  APTT  --   --   --   --   --   --  48*  LABPROT  --   --   --   --   --   --  15.3*  INR  --   --   --   --   --   --  1.2  HEPARINUNFRC  --   --   --   --   --   --  <0.10*  CREATININE 1.06  --   --   --   --  1.16 1.09  TROPONINIHS 612* 641*  --   --   --   --   --    < > = values in this interval not displayed.    Estimated Creatinine Clearance: 53.5 mL/min (by C-G formula based on SCr of 1.09 mg/dL).  Assessment: 26 yoM admitted with cardiogenic shock s/p Impella CP placement, for heparin.  Heparin level subtherapeutic.  Heparin infusing via purge at 775 units/hr, and peripherally at 200 units/hr  Goal of Therapy:  Heparin level 0.2-0.5 units/ml Monitor platelets by anticoagulation protocol: Yes   Plan:  Increase Heparin 450 units/hr F/U after CABG today  Geannie Risen, PharmD, BCPS   09/19/2019

## 2019-09-19 NOTE — Anesthesia Preprocedure Evaluation (Signed)
Anesthesia Evaluation  Patient identified by MRN, date of birth, ID band Patient awake    Reviewed: Allergy & Precautions, NPO status , Patient's Chart, lab work & pertinent test results  Airway Mallampati: II  TM Distance: >3 FB Neck ROM: Full    Dental  (+) Teeth Intact, Dental Advisory Given   Pulmonary former smoker,     + decreased breath sounds      Cardiovascular  Rhythm:Regular Rate:Tachycardia     Neuro/Psych    GI/Hepatic   Endo/Other    Renal/GU      Musculoskeletal   Abdominal   Peds  Hematology   Anesthesia Other Findings   Reproductive/Obstetrics                             Anesthesia Physical Anesthesia Plan  ASA: IV and emergent  Anesthesia Plan: General   Post-op Pain Management:    Induction: Intravenous  PONV Risk Score and Plan: Ondansetron  Airway Management Planned: Oral ETT  Additional Equipment: Arterial line, CVP, 3D TEE and Ultrasound Guidance Line Placement  Intra-op Plan:   Post-operative Plan: Post-operative intubation/ventilation  Informed Consent: I have reviewed the patients History and Physical, chart, labs and discussed the procedure including the risks, benefits and alternatives for the proposed anesthesia with the patient or authorized representative who has indicated his/her understanding and acceptance.     Dental advisory given  Plan Discussed with: CRNA and Anesthesiologist  Anesthesia Plan Comments:         Anesthesia Quick Evaluation

## 2019-09-19 NOTE — Anesthesia Procedure Notes (Signed)
Procedure Name: Intubation Date/Time: 09/19/2019 8:20 AM Performed by: Wilburn Cornelia, CRNA Pre-anesthesia Checklist: Patient identified, Emergency Drugs available, Suction available, Patient being monitored and Timeout performed Patient Re-evaluated:Patient Re-evaluated prior to induction Oxygen Delivery Method: Circle system utilized Preoxygenation: Pre-oxygenation with 100% oxygen Induction Type: IV induction Ventilation: Mask ventilation without difficulty and Oral airway inserted - appropriate to patient size Laryngoscope Size: Mac and 4 Grade View: Grade III Tube type: Oral Tube size: 8.0 mm Number of attempts: 1 Airway Equipment and Method: Stylet Placement Confirmation: ETT inserted through vocal cords under direct vision,  positive ETCO2,  CO2 detector and breath sounds checked- equal and bilateral Secured at: 23 cm Tube secured with: Tape Dental Injury: Teeth and Oropharynx as per pre-operative assessment

## 2019-09-19 NOTE — H&P (Addendum)
History and Physical Interval Note:  09/19/2019 7:22 AM  Dennis Roberts  has presented today for surgery, with the diagnosis of Heart failure.  The various methods of treatment have been discussed with the patient and family. After consideration of risks, benefits and other options for treatment, the patient has consented to  Procedure(s): RIGHT/LEFT HEART CATH AND CORONARY ANGIOGRAPHY (N/A) VENTRICULAR ASSIST DEVICE INSERTION (N/A) ARTERIAL LINE INSERTION (N/A) as a surgical intervention.  The patient's history has been reviewed, patient examined, no change in status, stable for surgery.  I have reviewed the patient's chart and labs.  Questions were answered to the patient's satisfaction.     Teng Decou Z Kahlia Lagunes  CORRECTION: THE PROCEDURE SHOULD READ: CORONARY ARTERY BYPASS GRAFTING, TRANS-ESOPHAGEAL ECHOCARDIOGRAPHY, BILATERAL INTERNAL MAMMARY ARTERY HARVESTING, AND ENDOSCOPIC VEIN HARVESTING.

## 2019-09-19 NOTE — Progress Notes (Signed)
Advanced Heart Failure Rounding Note   Subjective:    Feeling better this am. Breathing easier. No CP.   On milrinone 0.375. Lactate is normal. Swan numbers improved.   LDH rising and platelets dropping. Impella turned down p-9 -> p-7 given concern for hemolysis.   Urine is bloody. UA shows hgb but not bili so likely traumatic foley insertion and not due to hemolysis  For OR this am   Impella CP P-7 Flow 3.2  Waveforms ok.    Objective:   Weight Range:  Vital Signs:   Temp:  [98.2 F (36.8 C)-99.3 F (37.4 C)] 99 F (37.2 C) (03/13 0700) Pulse Rate:  [0-140] 102 (03/13 0700) Resp:  [0-46] 30 (03/13 0700) BP: (99-147)/(71-117) 117/97 (03/13 0700) SpO2:  [0 %-100 %] 96 % (03/13 0700) Arterial Line BP: (89-160)/(63-129) 113/85 (03/13 0700) FiO2 (%):  [50 %-100 %] 50 % (03/13 0415) Weight:  [70 kg-73.9 kg] 73.9 kg (03/13 0354) Last BM Date: (pta)  Weight change: Filed Weights   09/18/19 1547 09/18/19 1910 09/19/19 0354  Weight: 70 kg 73 kg 73.9 kg    Intake/Output:   Intake/Output Summary (Last 24 hours) at 09/19/2019 1151 Last data filed at 09/19/2019 1126 Gross per 24 hour  Intake 1934.69 ml  Output 4580 ml  Net -2645.31 ml     Physical Exam: General:  Weak appearing. Lying nearly flat in bed  No resp difficulty HEENT: normal Neck: supple. JVP 5-6 RIJ swan . Carotids 2+ bilat; no bruits. No lymphadenopathy or thryomegaly appreciated. Cor: PMI nondisplaced. Regular tachy Lungs: coarse Abdomen: soft, nontender, nondistended. No hepatosplenomegaly. No bruits or masses. Good bowel sounds. Extremities: no cyanosis, clubbing, rash, edema Impella site ok no bleeding Neuro: alert & orientedx3, cranial nerves grossly intact. moves all 4 extremities w/o difficulty. Affect pleasant  Telemetry: Sinus 100-110 Personally reviewed   Labs: Basic Metabolic Panel: Recent Labs  Lab 09/18/19 1300 09/18/19 1520 09/18/19 1619 09/18/19 1624 09/18/19 2005 09/19/19  0238 09/19/19 0431  NA 132*   < > 134* 135 134* 136 135  K 4.4   < > 4.2 4.1 4.3 3.6 3.5  CL 101  --   --   --  102 101  --   CO2 17*  --   --   --  21* 20*  --   GLUCOSE 131*  --   --   --  130* 128*  --   BUN 33*  --   --   --  34* 32*  --   CREATININE 1.06  --   --   --  1.16 1.09  --   CALCIUM 9.2  --   --   --  8.7* 8.7*  --   MG  --   --   --   --  1.9  --   --    < > = values in this interval not displayed.    Liver Function Tests: Recent Labs  Lab 09/18/19 1300 09/18/19 2005  AST 37 74*  ALT 31 41  ALKPHOS 76 71  BILITOT 1.3* 2.0*  PROT 6.7 6.2*  ALBUMIN 4.3 3.9   No results for input(s): LIPASE, AMYLASE in the last 168 hours. No results for input(s): AMMONIA in the last 168 hours.  CBC: Recent Labs  Lab 09/18/19 1300 09/18/19 1520 09/18/19 1619 09/18/19 1624 09/18/19 2005 09/19/19 0238 09/19/19 0431  WBC 10.7*  --   --   --  8.2 8.9  --  NEUTROABS 7.5  --   --   --   --  6.8  --   HGB 15.2   < > 14.6 14.3 13.2 13.0 12.2*  HCT 46.0   < > 43.0 42.0 39.3 37.9* 36.0*  MCV 93.3  --   --   --  92.5 89.8  --   PLT 179  --   --   --  155 136*  --    < > = values in this interval not displayed.    Cardiac Enzymes: No results for input(s): CKTOTAL, CKMB, CKMBINDEX, TROPONINI in the last 168 hours.  BNP: BNP (last 3 results) Recent Labs    09/18/19 1300  BNP 1,784.5*    ProBNP (last 3 results) No results for input(s): PROBNP in the last 8760 hours.    Other results:  Imaging: DG Chest 2 View  Result Date: 09/18/2019 CLINICAL DATA:  Shortness of breath. EXAM: CHEST - 2 VIEW COMPARISON:  None. FINDINGS: Mild cardiomegaly is noted. No pneumothorax is noted. Bilateral interstitial densities are noted concerning for pulmonary edema with mild bilateral pleural effusions. Bony thorax is unremarkable. IMPRESSION: Probable bilateral pulmonary edema with mild bilateral pleural effusions. Electronically Signed   By: Lupita Raider M.D.   On: 09/18/2019 11:26    CARDIAC CATHETERIZATION  Result Date: 09/18/2019  Prox RCA lesion is 99% stenosed.  Dist RCA lesion is 100% stenosed.  Mid LM to Dist LM lesion is 99% stenosed.  Ost Cx to Prox Cx lesion is 99% stenosed.  Mid Cx to Dist Cx lesion is 60% stenosed.  LPAV lesion is 80% stenosed.  Ost LAD lesion is 90% stenosed.  Prox LAD to Mid LAD lesion is 80% stenosed.  Mid LAD lesion is 80% stenosed.  Findings: Ao = 155/78 (96) LV = 114/39 RA = 13 RV = 50/12 PA = 52/32 (41) PCW = 33 Fick cardiac output/index = 3.7/1.84 Thermo CO/CI = 3.7/1.87 PVR = 2.1 WU SVR = 1809 FA sat = 97% PA sat = 62%, 62% CPO = 0.78 (on milrinone 0.25) Assessment: 1. 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 Cardiogenic shock Plan/Discussion: He has been stabilized with Impella support. Transfer to ICU. TCTS has seen. Likely emergent cath in am. Arvilla Meres, MD 6:55 PM   DG CHEST PORT 1 VIEW  Result Date: 09/18/2019 CLINICAL DATA:  Congestive failure EXAM: PORTABLE CHEST 1 VIEW COMPARISON:  09/18/2018 FINDINGS: Impella device is now noted in satisfactory position. Swan-Ganz catheter is noted within the right pulmonary artery. Increasing central vascular congestion and edema is noted with bilateral pleural effusions worsened in the interval from the prior exam. IMPRESSION: Tubes and lines as described above Worsened congestive failure with effusions. Electronically Signed   By: Alcide Clever M.D.   On: 09/18/2019 21:05   ECHOCARDIOGRAM COMPLETE  Result Date: 09/18/2019    ECHOCARDIOGRAM REPORT   Patient Name:   Dennis Roberts Date of Exam: 09/18/2019 Medical Rec #:  [id number]    Height: Accession #:    [id number]   Weight: Date of Birth:  [birth year]     BSA: Patient Age:    74 years     BP:           131/103 mmHg Patient Gender: M            HR:           124 bpm. Exam Location:  Inpatient Procedure: 2D Echo Indications:     dyspnea 786.09  History:  Patient has no prior history of Echocardiogram examinations.  Sonographer:     Delcie RochLauren  Pennington Referring Phys:  [id number] CADENCE H FURTH Diagnosing Phys: Kristeen MissPhilip Nahser MD IMPRESSIONS  1. Left ventricular ejection fraction, by estimation, is <20%. The left ventricle has severely decreased function. The left ventricle demonstrates regional wall motion abnormalities (see scoring diagram/findings for description). The left ventricular internal cavity size was moderately to severely dilated. Left ventricular diastolic parameters are consistent with Grade III diastolic dysfunction (restrictive). There is severe akinesis of the left ventricular, mid-apical anteroseptal wall and lateral wall.  2. Right ventricular systolic function is moderately reduced. The right ventricular size is normal. There is moderately elevated pulmonary artery systolic pressure.  3. The mitral valve is grossly normal. Trivial mitral valve regurgitation. No evidence of mitral stenosis.  4. The aortic valve is tricuspid. Aortic valve regurgitation is not visualized. No aortic stenosis is present. FINDINGS  Left Ventricle: Left ventricular ejection fraction, by estimation, is <20%. The left ventricle has severely decreased function. The left ventricle demonstrates regional wall motion abnormalities. Severe akinesis of the left ventricular, mid-apical anteroseptal wall and lateral wall. The left ventricular internal cavity size was moderately to severely dilated. There is no left ventricular hypertrophy. Left ventricular diastolic parameters are consistent with Grade III diastolic dysfunction (restrictive). Right Ventricle: The right ventricular size is normal. No increase in right ventricular wall thickness. Right ventricular systolic function is moderately reduced. There is moderately elevated pulmonary artery systolic pressure. The tricuspid regurgitant velocity is 3.66 m/s, and with an assumed right atrial pressure of 5 mmHg, the estimated right ventricular systolic pressure is 58.6 mmHg. Left Atrium: Left atrial size was normal  in size. Right Atrium: Right atrial size was normal in size. Pericardium: Trivial pericardial effusion is present. Mitral Valve: The mitral valve is grossly normal. Trivial mitral valve regurgitation. No evidence of mitral valve stenosis. Tricuspid Valve: The tricuspid valve is normal in structure. Tricuspid valve regurgitation is not demonstrated. Aortic Valve: The aortic valve is tricuspid. . There is mild thickening and mild calcification of the aortic valve. Aortic valve regurgitation is not visualized. No aortic stenosis is present. Mild aortic valve annular calcification. There is mild thickening of the aortic valve. There is mild calcification of the aortic valve. Pulmonic Valve: The pulmonic valve was normal in structure. Pulmonic valve regurgitation is not visualized. No evidence of pulmonic stenosis. Aorta: The aortic root and ascending aorta are structurally normal, with no evidence of dilitation. IAS/Shunts: The atrial septum is grossly normal.  LEFT VENTRICLE PLAX 2D LVIDd:         5.40 cm LVIDs:         4.80 cm LV PW:         0.90 cm LV IVS:        0.90 cm LVOT diam:     2.20 cm LV SV:         20 LVOT Area:     3.80 cm  LV Volumes (MOD) LV vol d, MOD A4C: 99.1 ml LV vol s, MOD A4C: 72.9 ml LV SV MOD A4C:     99.1 ml RIGHT VENTRICLE RV S prime:     11.20 cm/s TAPSE (M-mode): 1.4 cm LEFT ATRIUM             RIGHT ATRIUM LA diam:        3.70 cm RA Area:     12.60 cm LA Vol (A2C):   64.4 ml RA Volume:   31.50 ml LA Vol (  A4C):   34.7 ml LA Biplane Vol: 50.8 ml  AORTIC VALVE LVOT Vmax:   48.40 cm/s LVOT Vmean:  29.700 cm/s LVOT VTI:    0.052 m  AORTA Ao Root diam: 3.50 cm Ao Asc diam:  3.40 cm TRICUSPID VALVE TR Peak grad:   53.6 mmHg TR Vmax:        366.00 cm/s  SHUNTS Systemic VTI:  0.05 m Systemic Diam: 2.20 cm Mertie Moores MD Electronically signed by Mertie Moores MD Signature Date/Time: 09/18/2019/4:56:32 PM    Final (Updated)       Medications:     Scheduled Medications: . aspirin  81 mg Oral  Pre-Cath  . bisacodyl  5 mg Oral Once  . bupivacaine liposome  20 mL Infiltration To OR  . chlorhexidine  15 mL Mouth Rinse BID  . Chlorhexidine Gluconate Cloth  6 each Topical Daily  . digoxin  0.125 mg Oral Daily  . furosemide  80 mg Intravenous BID  . heparin-papaverine-plasmalyte irrigation   Irrigation To OR  . [place] Blood Cardioplegia vial (lidocaine/magnesium/mannitol 0.26g-4g-6.4g)   Intracoronary Once  . magnesium sulfate  40 mEq Other To OR  . mouth rinse  15 mL Mouth Rinse q12n4p  . phenylephrine  30-200 mcg/min Intravenous To OR  . potassium chloride  80 mEq Other To OR  . potassium chloride  40 mEq Oral BID  . sodium chloride flush  3 mL Intravenous Q12H  . sodium chloride flush  3 mL Intravenous Q12H  . sodium chloride flush  3 mL Intravenous Q12H  . spironolactone  12.5 mg Oral Daily  . tranexamic acid  2 mg/kg Intracatheter To OR     Infusions: . sodium chloride    . sodium chloride    . sodium chloride    . sodium chloride    . dexmedetomidine    . heparin 30,000 units/NS 1000 mL solution for CELLSAVER    . impella catheter heparin 50 unit/mL in dextrose 5% 50,000 Units (09/18/19 1815)  . heparin 450 Units/hr (09/19/19 0700)  . milrinone Stopped (09/19/19 1028)  . nitroGLYCERIN       PRN Medications:  sodium chloride, sodium chloride, sodium chloride, 0.9 % irrigation (POUR BTL), acetaminophen, ALPRAZolam, hemostatic agents, heparin-papaverine-plasmalyte irrigation, ondansetron (ZOFRAN) IV, sodium chloride flush, sodium chloride flush, sodium chloride flush, sterile water (preservative free), temazepam, zolpidem   Assessment/Plan:   1. Acute systolic HF ->Cardiogenic shock - 09/18/19: Echo EF 20% - 09/18/19: Cath 3v CAD with 99% LM  - Impella placed. On P-7 with good waveforms - Swan numbers much improved on milrinone 0.375 - Suspect will need Impella support post-op for some time - Continue dig/spiro for now  2. Unstable angina with 3v-CAD -  09/18/19 cath with 99% LM - for CABG today. D/w Dr. Orvan Seen - continue heparin, ASA, statin  3. Acute hypoxic respiratory failure - improved with BIPAP and diuresis  4. Hematuria - due to Foley trauma   CRITICAL CARE Performed by: Glori Bickers  Total critical care time: 45 minutes  Critical care time was exclusive of separately billable procedures and treating other patients.  Critical care was necessary to treat or prevent imminent or life-threatening deterioration.  Critical care was time spent personally by me (independent of midlevel providers or residents) on the following activities: development of treatment plan with patient and/or surrogate as well as nursing, discussions with consultants, evaluation of patient's response to treatment, examination of patient, obtaining history from patient or surrogate, ordering and performing treatments and  interventions, ordering and review of laboratory studies, ordering and review of radiographic studies, pulse oximetry and re-evaluation of patient's condition.  Length of Stay: 1   Arvilla Meres MD 09/19/2019, 11:51 AM  Advanced Heart Failure Team Pager [phone] (M-F; 7a - 4p)  Please contact [facility] for night-coverage after hours (4p -7a ) and weekends on [email or web address]

## 2019-09-19 NOTE — Op Note (Signed)
CARDIOTHORACIC SURGERY OPERATIVE NOTE  Date of Procedure: 09/19/2019  Preoperative Diagnosis: Severe 3-vessel Coronary Artery Disease including LM CAD with severely depressed LV function  Postoperative Diagnosis: Same  Procedure:    Coronary Artery Bypass Grafting x 4  Left Internal Mammary Artery to Distal Left Anterior Descending Coronary Artery; Saphenous Vein Graft to left Posterior Descending Coronary Artery and 1st Obtuse Marginal Branch of Left Circumflex Coronary Artery as a sequenced graft; Sapheonous Vein Graft to 1st Diagonal Branch Coronary Artery; Endoscopic Vein Harvest from rightThigh and Lower Leg PFO closure Completion indocyanine green fluorescence imaging (SPY) Epiaortic ultrasonography  Surgeon: B. Murvin Natal, MD  Assistant: Macarthur Critchley PA-C  Anesthesia: get  Operative Findings:  Severely reduced left ventricular systolic function  Good quality left internal mammary artery conduit  Good quality saphenous vein conduit  Good quality, small diameter target vessels for grafting    BRIEF CLINICAL NOTE AND INDICATIONS FOR SURGERY  74 year old gentleman presented with decompensated heart failure after a brief period of clinical evaluation.  He underwent left heart catheterization for heart failure and NSTEMI yesterday.  Demonstrated severe multivessel disease and severely reduced left ventricular function.  An Impella device was placed via the right groin to stabilize overnight.  He was taken to the operating room for surgical revascularization.   DETAILS OF THE OPERATIVE PROCEDURE  Preparation:  The patient is brought to the operating room on the above mentioned date and central monitoring was established by the anesthesia team including placement of Swan-Ganz catheter and radial arterial line. The patient is placed in the supine position on the operating table.  Intravenous antibiotics are administered. General endotracheal anesthesia is induced  uneventfully. A Foley catheter is placed.  Baseline transesophageal echocardiogram was performed.  Findings were notable for reduced LV function.  There was also a PFO identified  The patient's chest, abdomen, both groins, and both lower extremities are prepared and draped in a sterile manner. A time out procedure is performed.   Surgical Approach and Conduit Harvest:  A median sternotomy incision was performed and the left internal mammary artery is dissected from the chest wall and prepared for bypass grafting. The left internal mammary artery is notably good quality conduit. Simultaneously, the greater saphenous vein is obtained from the patient's thigh using endoscopic vein harvest technique. The saphenous vein is notably good quality conduit. After removal of the saphenous vein, the small surgical incisions in the lower extremity are closed with absorbable suture. Following systemic heparinization, the left internal mammary artery was transected distally noted to have excellent flow.   Extracorporeal Cardiopulmonary Bypass and Myocardial Protection:  The pericardium is opened. The ascending aorta is nondiseased in appearance. The ascending aorta and the superior and inferior vena cava are cannulated for cardiopulmonary bypass.  Adequate heparinization is verified.  The aortic ultrasonography of the ascending aorta was performed.  This demonstrated clean endothelium without plaque or significant thickening of the aorta.  A retrograde cardioplegia cannula is placed through the right atrium into the coronary sinus.  The entire pre-bypass portion of the operation was notable for stable hemodynamics.  Cardiopulmonary bypass was begun and the surface of the heart is inspected. Distal target vessels are selected for coronary artery bypass grafting. A cardioplegia cannula is placed in the ascending aorta. .  The patient is allowed to cool passively to [facility] systemic temperature.  The aortic cross  clamp is applied and cold blood cardioplegia is delivered initially in an antegrade fashion through the aortic root.  Supplemental cardioplegia is  given retrograde through the coronary sinus catheter.  Iced saline slush is applied for topical hypothermia.  The initial cardioplegic arrest is rapid with early diastolic arrest.  Repeat doses of cardioplegia are administered intermittently throughout the entire cross clamp portion of the operation through the aortic root,  through the coronary sinus catheter, and through subsequently placed vein grafts in order to maintain completely flat electrocardiogram.  Caval snares were then brought down on the superior and inferior vena cava.  The right atrial free wall was opened.  The patent foramen ovale was oversewn with a running suture of 4-0 Prolene.  The free wall was then closed with a running suture of 3-0 Prolene.   Coronary Artery Bypass Grafting:   The first diagonal branch of the left anterior descending coronary artery was grafted using a reversed saphenous vein graft in an end-to-side fashion.  At the site of distal anastomosis the target vessel was good quality and measured approximately 1.5 mm in diameter.  The left posterior descending branch of the left coronary artery was grafted using a reversed saphenous vein graft in an end-to-side fashion.  At the site of distal anastomosis the target vessel was good quality and measured slightly less and 1.5 mm in diameter. Next, the obtuse marginal branch of the left circumflex coronary artery was grafted using the same reversed saphenous vein graft in an side-to-side fashion to create a sequenced configuration.  At the site of distal anastomosis the target vessel was good quality and measured approximately 1.5 mm in diameter.   The distal left anterior coronary artery was grafted with the left internal mammary artery in an end-to-side fashion.  At the site of distal anastomosis the target vessel was good  quality and measured approximately 1.5 mm in diameter. Anastomotic patency and runoff was confirmed with indocyanine green fluorescence imaging (SPY).  All proximal vein graft anastomoses were placed directly to the ascending aorta prior to removal of the aortic cross clamp.  De-airing procedures were performed and the aortic cross-clamp was removed   Procedure Completion:  All proximal and distal coronary anastomoses were inspected for hemostasis and appropriate graft orientation. Epicardial pacing wires are fixed to the right ventricular outflow tract and to the right atrial appendage. The patient is rewarmed to 37C temperature. The patient is weaned and disconnected from cardiopulmonary bypass.  The patient's rhythm at separation from bypass was sinus bradycardia.  The patient was weaned from cardiopulmonary bypass with moderate inotropic support.   Followup transesophageal echocardiogram performed after separation from bypass revealed improved LV function.  The aortic and venous cannula were removed uneventfully. Protamine was administered to reverse the anticoagulation. The mediastinum and pleural space were inspected for hemostasis and irrigated with saline solution. The mediastinum and bilateral pleural space were drained using fluted chest tubes placed through separate stab incisions inferiorly.  The soft tissues anterior to the aorta were reapproximated loosely. The sternum is closed with double strength sternal wire. The soft tissues anterior to the sternum were closed in multiple layers and the skin is closed with a running subcuticular skin closure.  The post-bypass portion of the operation was notable for stable rhythm and hemodynamics.     Disposition:  The patient tolerated the procedure well and is transported to the surgical intensive care in stable condition. There are no intraoperative complications. All sponge instrument and needle counts are verified correct at completion of  the operation.    Brantley Fling, MD 09/19/2019 4:18 PM

## 2019-09-19 NOTE — Transfer of Care (Signed)
Immediate Anesthesia Transfer of Care Note  Patient: Dennis Roberts  Procedure(s) Performed: CORONARY ARTERY BYPASS GRAFTING (CABG) using LIMA to LAD; Endoscopic harvest right saphenous vein: SVG tp Diag; SVG sequential to OM1 and OM2. (N/A Chest) TRANSESOPHAGEAL ECHOCARDIOGRAM (TEE) (N/A ) Endovein Harvest Of Greater Saphenous Vein (Right Leg Upper) CLOSURE OF PATENT DUCTUS ARTERIOSUS (N/A Chest)  Patient Location: SICU  Anesthesia Type:General  Level of Consciousness: sedated and Patient remains intubated per anesthesia plan  Airway & Oxygen Therapy: Patient remains intubated per anesthesia plan and Patient placed on Ventilator (see vital sign flow sheet for setting)  Post-op Assessment: Report given to RN and Post -op Vital signs reviewed and stable  Post vital signs: Reviewed and stable  Last Vitals:  Vitals Value Taken Time  BP    Temp    Pulse    Resp    SpO2      Last Pain:  Vitals:   09/19/19 0400  TempSrc:   PainSc: 0-No pain         Complications: No apparent anesthesia complications

## 2019-09-19 NOTE — Brief Op Note (Signed)
09/19/2019  3:07 PM  PATIENT:  Dennis Roberts  74 y.o. male  PRE-OPERATIVE DIAGNOSIS:  LEFT MAIN CORONARY ARTERY DISEASE with depressed LV function  POST-OPERATIVE DIAGNOSIS:  SAME  PROCEDURE:  Procedure(s): CORONARY ARTERY BYPASS GRAFTING (CABG) using LIMA to LAD; Endoscopic harvest right saphenous vein: SVG tp Diag; SVG sequential to OM1 and OM2. (N/A) TRANSESOPHAGEAL ECHOCARDIOGRAM (TEE) (N/A) Endovein Harvest Of Greater Saphenous Vein (Right) CLOSURE OF PATENT DUCTUS ARTERIOSUS (N/A)  SURGEON:  Surgeon(s) and Role:    * Linden Dolin, MD - Primary  PHYSICIAN ASSISTANT: Irena Reichmann A_C  ASSISTANTS: staff   ANESTHESIA:   general  EBL:  471 mL   BLOOD ADMINISTERED:500 cc FFP  DRAINS: 3  Chest Tube(s) in the mediastinum and bilateral pleural speaces   LOCAL MEDICATIONS USED:  NONE  SPECIMEN:  No Specimen  DISPOSITION OF SPECIMEN:  N/A  COUNTS:  YES  TOURNIQUET:  * No tourniquets in log *  DICTATION: .Note written in EPIC  PLAN OF CARE: Admit to inpatient   PATIENT DISPOSITION:  ICU - intubated and critically ill.   Delay start of Pharmacological VTE agent (>24hrs) due to surgical blood loss or risk of bleeding: yes

## 2019-09-19 NOTE — Progress Notes (Signed)
Patient departed for OR.

## 2019-09-19 NOTE — Anesthesia Procedure Notes (Signed)
Central Venous Catheter Insertion Performed by: Gaynelle Adu, MD, anesthesiologist Start/End3/13/2021 8:30 AM, 09/19/2019 8:40 AM Patient location: Pre-op. Preanesthetic checklist: patient identified, IV checked, site marked, risks and benefits discussed, surgical consent, monitors and equipment checked, pre-op evaluation, timeout performed and anesthesia consent Position: Trendelenburg Lidocaine 1% used for infiltration and patient sedated Hand hygiene performed , maximum sterile barriers used  and Seldinger technique used Catheter size: 8 Fr Total catheter length 16. Central line was placed.Double lumen Procedure performed using ultrasound guided technique. Ultrasound Notes:anatomy identified, needle tip was noted to be adjacent to the nerve/plexus identified, no ultrasound evidence of intravascular and/or intraneural injection and image(s) printed for medical record Attempts: 1 Following insertion, dressing applied, line sutured and Biopatch. Post procedure assessment: blood return through all ports  Patient tolerated the procedure well with no immediate complications.

## 2019-09-20 ENCOUNTER — Inpatient Hospital Stay (HOSPITAL_COMMUNITY): Payer: Medicare Other

## 2019-09-20 ENCOUNTER — Other Ambulatory Visit (HOSPITAL_COMMUNITY): Payer: Medicare Other

## 2019-09-20 DIAGNOSIS — I509 Heart failure, unspecified: Secondary | ICD-10-CM

## 2019-09-20 DIAGNOSIS — Z95811 Presence of heart assist device: Secondary | ICD-10-CM

## 2019-09-20 LAB — PROTIME-INR
INR: 1.6 — ABNORMAL HIGH (ref 0.8–1.2)
Prothrombin Time: 18.9 seconds — ABNORMAL HIGH (ref 11.4–15.2)

## 2019-09-20 LAB — CBC WITH DIFFERENTIAL/PLATELET
Abs Immature Granulocytes: 0.04 10*3/uL (ref 0.00–0.07)
Basophils Absolute: 0 10*3/uL (ref 0.0–0.1)
Basophils Relative: 0 %
Eosinophils Absolute: 0 10*3/uL (ref 0.0–0.5)
Eosinophils Relative: 0 %
HCT: 26.7 % — ABNORMAL LOW (ref 39.0–52.0)
Hemoglobin: 9.7 g/dL — ABNORMAL LOW (ref 13.0–17.0)
Immature Granulocytes: 0 %
Lymphocytes Relative: 17 %
Lymphs Abs: 1.8 10*3/uL (ref 0.7–4.0)
MCH: 32.4 pg (ref 26.0–34.0)
MCHC: 36.3 g/dL — ABNORMAL HIGH (ref 30.0–36.0)
MCV: 89.3 fL (ref 80.0–100.0)
Monocytes Absolute: 1.5 10*3/uL — ABNORMAL HIGH (ref 0.1–1.0)
Monocytes Relative: 15 %
Neutro Abs: 7.2 10*3/uL (ref 1.7–7.7)
Neutrophils Relative %: 68 %
Platelets: 130 10*3/uL — ABNORMAL LOW (ref 150–400)
RBC: 2.99 MIL/uL — ABNORMAL LOW (ref 4.22–5.81)
RDW: 14.2 % (ref 11.5–15.5)
WBC: 10.6 10*3/uL — ABNORMAL HIGH (ref 4.0–10.5)
nRBC: 0.2 % (ref 0.0–0.2)

## 2019-09-20 LAB — BASIC METABOLIC PANEL
Anion gap: 9 (ref 5–15)
Anion gap: 7 (ref 5–15)
Anion gap: 8 (ref 5–15)
BUN: 24 mg/dL — ABNORMAL HIGH (ref 8–23)
BUN: 22 mg/dL (ref 8–23)
BUN: 21 mg/dL (ref 8–23)
CO2: 20 mmol/L — ABNORMAL LOW (ref 22–32)
CO2: 21 mmol/L — ABNORMAL LOW (ref 22–32)
CO2: 20 mmol/L — ABNORMAL LOW (ref 22–32)
Calcium: 8 mg/dL — ABNORMAL LOW (ref 8.9–10.3)
Calcium: 7.7 mg/dL — ABNORMAL LOW (ref 8.9–10.3)
Calcium: 7.6 mg/dL — ABNORMAL LOW (ref 8.9–10.3)
Chloride: 105 mmol/L (ref 98–111)
Chloride: 105 mmol/L (ref 98–111)
Chloride: 104 mmol/L (ref 98–111)
Creatinine, Ser: 1.27 mg/dL — ABNORMAL HIGH (ref 0.61–1.24)
Creatinine, Ser: 1.13 mg/dL (ref 0.61–1.24)
Creatinine, Ser: 1.12 mg/dL (ref 0.61–1.24)
GFR calc Af Amer: >60 mL/min (ref >60)
GFR calc Af Amer: >60 mL/min (ref >60)
GFR calc Af Amer: >60 mL/min (ref >60)
GFR calc non Af Amer: 56 mL/min — ABNORMAL LOW (ref >60)
GFR calc non Af Amer: >60 mL/min (ref >60)
GFR calc non Af Amer: >60 mL/min (ref >60)
Glucose, Bld: 117 mg/dL — ABNORMAL HIGH (ref 70–99)
Glucose, Bld: 126 mg/dL — ABNORMAL HIGH (ref 70–99)
Glucose, Bld: 117 mg/dL — ABNORMAL HIGH (ref 70–99)
Potassium: 3.7 mmol/L (ref 3.5–5.1)
Potassium: 4.3 mmol/L (ref 3.5–5.1)
Potassium: 3.9 mmol/L (ref 3.5–5.1)
Sodium: 134 mmol/L — ABNORMAL LOW (ref 135–145)
Sodium: 133 mmol/L — ABNORMAL LOW (ref 135–145)
Sodium: 132 mmol/L — ABNORMAL LOW (ref 135–145)

## 2019-09-20 LAB — POCT I-STAT 7, (LYTES, BLD GAS, ICA,H+H)
Acid-base deficit: 2 mmol/L (ref 0.0–2.0)
Acid-base deficit: 2 mmol/L (ref 0.0–2.0)
Bicarbonate: 21.6 mmol/L (ref 20.0–28.0)
Bicarbonate: 22.2 mmol/L (ref 20.0–28.0)
Bicarbonate: 23.2 mmol/L (ref 20.0–28.0)
Calcium, Ion: 1.17 mmol/L (ref 1.15–1.40)
Calcium, Ion: 1.16 mmol/L (ref 1.15–1.40)
Calcium, Ion: 1.12 mmol/L — ABNORMAL LOW (ref 1.15–1.40)
HCT: 24 % — ABNORMAL LOW (ref 39.0–52.0)
HCT: 23 % — ABNORMAL LOW (ref 39.0–52.0)
HCT: 22 % — ABNORMAL LOW (ref 39.0–52.0)
Hemoglobin: 8.2 g/dL — ABNORMAL LOW (ref 13.0–17.0)
Hemoglobin: 7.8 g/dL — ABNORMAL LOW (ref 13.0–17.0)
Hemoglobin: 7.5 g/dL — ABNORMAL LOW (ref 13.0–17.0)
O2 Saturation: 99 %
O2 Saturation: 92 %
O2 Saturation: 99 %
Patient temperature: 37.8
Patient temperature: 36.8
Patient temperature: 36.6
Potassium: 3.7 mmol/L (ref 3.5–5.1)
Potassium: 3.7 mmol/L (ref 3.5–5.1)
Potassium: 3.6 mmol/L (ref 3.5–5.1)
Sodium: 135 mmol/L (ref 135–145)
Sodium: 135 mmol/L (ref 135–145)
Sodium: 136 mmol/L (ref 135–145)
TCO2: 23 mmol/L (ref 22–32)
TCO2: 23 mmol/L (ref 22–32)
TCO2: 24 mmol/L (ref 22–32)
pCO2 arterial: 32.9 mmHg (ref 32.0–48.0)
pCO2 arterial: 32.9 mmHg (ref 32.0–48.0)
pCO2 arterial: 31.8 mmHg — ABNORMAL LOW (ref 32.0–48.0)
pH, Arterial: 7.428 (ref 7.350–7.450)
pH, Arterial: 7.436 (ref 7.350–7.450)
pH, Arterial: 7.469 — ABNORMAL HIGH (ref 7.350–7.450)
pO2, Arterial: 142 mmHg — ABNORMAL HIGH (ref 83.0–108.0)
pO2, Arterial: 61 mmHg — ABNORMAL LOW (ref 83.0–108.0)
pO2, Arterial: 123 mmHg — ABNORMAL HIGH (ref 83.0–108.0)

## 2019-09-20 LAB — GLUCOSE, CAPILLARY
Glucose-Capillary: 111 mg/dL — ABNORMAL HIGH (ref 70–99)
Glucose-Capillary: 117 mg/dL — ABNORMAL HIGH (ref 70–99)
Glucose-Capillary: 121 mg/dL — ABNORMAL HIGH (ref 70–99)
Glucose-Capillary: 121 mg/dL — ABNORMAL HIGH (ref 70–99)
Glucose-Capillary: 121 mg/dL — ABNORMAL HIGH (ref 70–99)
Glucose-Capillary: 122 mg/dL — ABNORMAL HIGH (ref 70–99)
Glucose-Capillary: 125 mg/dL — ABNORMAL HIGH (ref 70–99)
Glucose-Capillary: 128 mg/dL — ABNORMAL HIGH (ref 70–99)
Glucose-Capillary: 129 mg/dL — ABNORMAL HIGH (ref 70–99)
Glucose-Capillary: 131 mg/dL — ABNORMAL HIGH (ref 70–99)
Glucose-Capillary: 132 mg/dL — ABNORMAL HIGH (ref 70–99)
Glucose-Capillary: 135 mg/dL — ABNORMAL HIGH (ref 70–99)
Glucose-Capillary: 138 mg/dL — ABNORMAL HIGH (ref 70–99)
Glucose-Capillary: 139 mg/dL — ABNORMAL HIGH (ref 70–99)
Glucose-Capillary: 141 mg/dL — ABNORMAL HIGH (ref 70–99)
Glucose-Capillary: 141 mg/dL — ABNORMAL HIGH (ref 70–99)
Glucose-Capillary: 143 mg/dL — ABNORMAL HIGH (ref 70–99)
Glucose-Capillary: 153 mg/dL — ABNORMAL HIGH (ref 70–99)

## 2019-09-20 LAB — PREPARE FRESH FROZEN PLASMA: Unit division: 0

## 2019-09-20 LAB — HEPARIN LEVEL (UNFRACTIONATED): Heparin Unfractionated: <0.1 IU/mL — ABNORMAL LOW (ref 0.30–0.70)

## 2019-09-20 LAB — CBC
HCT: 25.5 % — ABNORMAL LOW (ref 39.0–52.0)
HCT: 25.5 % — ABNORMAL LOW (ref 39.0–52.0)
Hemoglobin: 8.8 g/dL — ABNORMAL LOW (ref 13.0–17.0)
Hemoglobin: 8.9 g/dL — ABNORMAL LOW (ref 13.0–17.0)
MCH: 30.9 pg (ref 26.0–34.0)
MCH: 31.1 pg (ref 26.0–34.0)
MCHC: 34.5 g/dL (ref 30.0–36.0)
MCHC: 34.9 g/dL (ref 30.0–36.0)
MCV: 89.5 fL (ref 80.0–100.0)
MCV: 89.2 fL (ref 80.0–100.0)
Platelets: 74 10*3/uL — ABNORMAL LOW (ref 150–400)
Platelets: 66 10*3/uL — ABNORMAL LOW (ref 150–400)
RBC: 2.85 MIL/uL — ABNORMAL LOW (ref 4.22–5.81)
RBC: 2.86 MIL/uL — ABNORMAL LOW (ref 4.22–5.81)
RDW: 14.4 % (ref 11.5–15.5)
RDW: 14.5 % (ref 11.5–15.5)
WBC: 10.7 10*3/uL — ABNORMAL HIGH (ref 4.0–10.5)
WBC: 9.5 10*3/uL (ref 4.0–10.5)
nRBC: 0 % (ref 0.0–0.2)
nRBC: 0 % (ref 0.0–0.2)

## 2019-09-20 LAB — BPAM FFP
Blood Product Expiration Date: 202103162359
Blood Product Expiration Date: 202103162359
ISSUE DATE / TIME: 202103130943
ISSUE DATE / TIME: 202103130943
Unit Type and Rh: 6200
Unit Type and Rh: 6200

## 2019-09-20 LAB — APTT: aPTT: 42 seconds — ABNORMAL HIGH (ref 24–36)

## 2019-09-20 LAB — TYPE AND SCREEN
ABO/RH(D): A POS
Antibody Screen: NEGATIVE
Unit division: 0
Unit division: 0

## 2019-09-20 LAB — BPAM RBC
Blood Product Expiration Date: 202103202359
Blood Product Expiration Date: 202103202359
ISSUE DATE / TIME: 202103130944
ISSUE DATE / TIME: 202103130944
Unit Type and Rh: 6200
Unit Type and Rh: 6200

## 2019-09-20 LAB — POCT ACTIVATED CLOTTING TIME
Activated Clotting Time: 147 seconds
Activated Clotting Time: 147 seconds

## 2019-09-20 LAB — LACTATE DEHYDROGENASE: LDH: 413 U/L — ABNORMAL HIGH (ref 98–192)

## 2019-09-20 LAB — ECHOCARDIOGRAM LIMITED
Height: 65.5 in
Weight: 2836 oz

## 2019-09-20 LAB — MAGNESIUM
Magnesium: 2.2 mg/dL (ref 1.7–2.4)
Magnesium: 2 mg/dL (ref 1.7–2.4)

## 2019-09-20 MED ORDER — FUROSEMIDE 10 MG/ML IJ SOLN: 80.0000 mg | Freq: Every day | INTRAMUSCULAR | Status: DC

## 2019-09-20 MED ORDER — AMIODARONE HCL IN DEXTROSE 360-4.14 MG/200ML-% IV SOLN
INTRAVENOUS | Status: AC
  Administered 2019-09-20: 03:00:00 60 mg/h
  Filled 2019-09-20: qty 200

## 2019-09-20 MED ORDER — POTASSIUM CHLORIDE CRYS ER 20 MEQ PO TBCR
20.0000 meq | EXTENDED_RELEASE_TABLET | ORAL | Status: AC
  Administered 2019-09-20 – 2019-09-21 (×3): 20 meq via ORAL
  Filled 2019-09-20 (×3): qty 1

## 2019-09-20 MED ORDER — CHLORHEXIDINE GLUCONATE 0.12 % MT SOLN
OROMUCOSAL | Status: AC
  Filled 2019-09-20: qty 15

## 2019-09-20 MED ORDER — AMIODARONE HCL IN DEXTROSE 360-4.14 MG/200ML-% IV SOLN
30.0000 mg/h | INTRAVENOUS | Status: DC
  Administered 2019-09-20 – 2019-09-25 (×10): 30 mg/h via INTRAVENOUS
  Filled 2019-09-20 (×4): qty 200
  Filled 2019-09-20: qty 600
  Filled 2019-09-20 (×6): qty 200

## 2019-09-20 MED ORDER — FUROSEMIDE 10 MG/ML IJ SOLN
40.0000 mg | Freq: Once | INTRAMUSCULAR | Status: AC
  Administered 2019-09-20: 17:00:00 40 mg via INTRAVENOUS
  Filled 2019-09-20: qty 4

## 2019-09-20 MED ORDER — POTASSIUM CHLORIDE 10 MEQ/50ML IV SOLN
10.0000 meq | INTRAVENOUS | Status: AC
  Administered 2019-09-20 (×3): 10 meq via INTRAVENOUS
  Filled 2019-09-20 (×3): qty 50

## 2019-09-20 MED ORDER — AMIODARONE HCL IN DEXTROSE 360-4.14 MG/200ML-% IV SOLN
60.0000 mg/h | INTRAVENOUS | Status: AC
  Administered 2019-09-20: 60 mg/h via INTRAVENOUS
  Filled 2019-09-20: qty 200

## 2019-09-20 MED ORDER — HEPARIN SODIUM (PORCINE) 5000 UNIT/ML IJ SOLN
50000.0000 [IU] | INTRAVENOUS | Status: DC
  Administered 2019-09-20 – 2019-09-22 (×2): 50000 [IU]
  Filled 2019-09-20 (×3): qty 10

## 2019-09-20 NOTE — Progress Notes (Signed)
Anesthesiology follow-up:  Patient on vent, stable respiratory parameters, now weaning NO, diuresing well. Vasopressor support reduced.   VS: T- 36.9 HR- 95 (a-paced) BP-88/60 (67)  PA 30/17 CVP-8 CO/CI-3.5/2.0  FiO2-0.5 SIMV 12 TV-500 PAP-16 PEEP-5  Impella: P6 Flow: 2.9 lpm   Milrinone 0.25 mcg/kg/min Epi-5 mcg/min Norepi- 24 mcg/min  PH 7.43 PCO2-33 PO2-142  K-4.3 BUN/Cr-22/1.13 glucose-126 H/H- 9.7/26.7 platelets- 130,000 LDH-14  74 year old male present with CHF cardiogenic shock severe LV dysfunction, non-stemi and severe 3V CAD. Impella inserted day before surgery Now one day S/P CABG x 4. Improving hemodynamically, should be able extubate soon.  Kipp Brood, MD

## 2019-09-20 NOTE — Anesthesia Postprocedure Evaluation (Signed)
Anesthesia Post Note  Patient: Dennis Roberts  Procedure(s) Performed: CORONARY ARTERY BYPASS GRAFTING (CABG) using LIMA to LAD; Endoscopic harvest right saphenous vein: SVG tp Diag; SVG sequential to OM1 and OM2. (N/A Chest) TRANSESOPHAGEAL ECHOCARDIOGRAM (TEE) (N/A ) Endovein Harvest Of Greater Saphenous Vein (Right Leg Upper) CLOSURE OF PATENT DUCTUS ARTERIOSUS (N/A Chest)     Patient location during evaluation: SICU Anesthesia Type: General Level of consciousness: sedated and patient remains intubated per anesthesia plan Pain management: pain level controlled Vital Signs Assessment: post-procedure vital signs reviewed and stable Respiratory status: patient remains intubated per anesthesia plan and patient on ventilator - see flowsheet for VS Cardiovascular status: stable and blood pressure returned to baseline Postop Assessment: no apparent nausea or vomiting Anesthetic complications: no    Last Vitals:  Vitals:   09/20/19 0600 09/20/19 0615  BP: 95/74   Pulse: 98 98  Resp: (!) 22 (!) 22  Temp: 37.8 C (!) 34.7 C  SpO2: 100% 100%    Last Pain:  Vitals:   09/20/19 0045  TempSrc: Core  PainSc:                  Makalia Bare COKER

## 2019-09-20 NOTE — Plan of Care (Signed)

## 2019-09-20 NOTE — Progress Notes (Signed)
Advanced Heart Failure Rounding Note   Subjective:    Events: - impella placed 3/12 - Underwent CABG 3/13 (LIMA to LAD; SVG to Diag; SVG sequential to OM1 and OM2)  Remains intubated. Awake following commands. Diuresing well on IV lasix  On epi 6, VP 0.05, NE 18, milrinone 0.25 and iNO   Swan numbers: CVP 8 PA 27/16 (21) PCWP 16 Thermo 3.6/2.0 SVR 1360 PVR 1.1 WU   On Impella P-6 Flow 3.0 L Waveforms ok (Personally reviewed)   Objective:   Weight Range:  Vital Signs:   Temp:  [94.5 F (34.7 C)-100.8 F (38.2 C)] 99.3 F (37.4 C) (03/14 1200) Pulse Rate:  [61-119] 95 (03/14 1200) Resp:  [14-36] 19 (03/14 1200) BP: (79-96)/(57-79) 79/65 (03/14 1200) SpO2:  [90 %-100 %] 100 % (03/14 1200) Arterial Line BP: (76-119)/(50-72) 80/62 (03/14 1200) FiO2 (%):  [50 %] 50 % (03/14 1200) Weight:  [80.4 kg] 80.4 kg (03/14 0600) Last BM Date: (PTA)  Weight change: Filed Weights   09/18/19 1910 09/19/19 0354 09/20/19 0600  Weight: 73 kg 73.9 kg 80.4 kg    Intake/Output:   Intake/Output Summary (Last 24 hours) at 09/20/2019 1335 Last data filed at 09/20/2019 1300 Gross per 24 hour  Intake 5463.88 ml  Output 4435 ml  Net 1028.88 ml     Physical Exam: General:  Awake on vent HEENT: normal + ETT Neck: supple. RIJ swan Carotids 2+ bilat; no bruits. No lymphadenopathy or thryomegaly appreciated. Cor: sternal dressing ok PMI nondisplaced. Regular rate & rhythm. No rubs, gallops or murmurs. + CT Lungs: coarse Abdomen: soft, nontender, mildly distended. No hepatosplenomegaly. No bruits or masses. Hypoactive bowel sounds. Extremities: no cyanosis, clubbing, rash, 1+ edema RFA impella site ok Neuro: awake on vent following commands cranial nerves grossly intact. moves all 4 extremities w/o difficulty.   Telemetry: A paced 90s Personally reviewed   Labs: Basic Metabolic Panel: Recent Labs  Lab 09/18/19 2005 09/18/19 2005 09/19/19 0238 09/19/19 0431 09/19/19 1944  09/19/19 1953 09/20/19 0337 09/20/19 0426 09/20/19 1216  NA 134*   < > 136   < > 136 137 135 134* 133*  K 4.3   < > 3.6   < > 4.0 4.0 3.7 3.7 4.3  CL 102  --  101  --  106  --   --  105 105  CO2 21*  --  20*  --  21*  --   --  20* 21*  GLUCOSE 130*  --  128*  --  152*  --   --  117* 126*  BUN 34*  --  32*  --  23  --   --  24* 22  CREATININE 1.16  --  1.09  --  1.22  --   --  1.27* 1.13  CALCIUM 8.7*   < > 8.7*   < > 7.8*  --   --  8.0* 7.7*  MG 1.9  --   --   --   --   --   --  2.2  --    < > = values in this interval not displayed.    Liver Function Tests: Recent Labs  Lab 09/18/19 1300 09/18/19 2005  AST 37 74*  ALT 31 41  ALKPHOS 76 71  BILITOT 1.3* 2.0*  PROT 6.7 6.2*  ALBUMIN 4.3 3.9   No results for input(s): LIPASE, AMYLASE in the last 168 hours. No results for input(s): AMMONIA in the last 168 hours.  CBC: Recent Labs  Lab 09/18/19 1300 09/18/19 1520 09/18/19 2005 09/18/19 2005 09/19/19 0238 09/19/19 0431 09/19/19 1155 09/19/19 1155 09/19/19 1404 09/19/19 1404 09/19/19 1408 09/19/19 1944 09/19/19 1953 09/20/19 0337 09/20/19 0426  WBC 10.7*   < > 8.2  --  8.9  --   --   --  13.2*  --   --  10.1  --   --  10.6*  NEUTROABS 7.5  --   --   --  6.8  --   --   --   --   --   --   --   --   --  7.2  HGB 15.2   < > 13.2   < > 13.0   < > 7.8*   < > 10.8*   < > 10.5* 9.3* 8.5* 8.2* 9.7*  HCT 46.0   < > 39.3   < > 37.9*   < > 22.4*   < > 31.5*   < > 31.0* 27.2* 25.0* 24.0* 26.7*  MCV 93.3   < > 92.5  --  89.8  --   --   --  91.6  --   --  90.7  --   --  89.3  PLT 179   < > 155   < > 136*  --  94*  --  94*  --   --  103*  --   --  130*   < > = values in this interval not displayed.    Cardiac Enzymes: No results for input(s): CKTOTAL, CKMB, CKMBINDEX, TROPONINI in the last 168 hours.  BNP: BNP (last 3 results) Recent Labs    09/18/19 1300  BNP 1,784.5*    ProBNP (last 3 results) No results for input(s): PROBNP in the last 8760 hours.    Other  results:  Imaging: DG Chest 1 View  Result Date: 09/19/2019 CLINICAL DATA:  Status post CABG surgery. EXAM: CHEST  1 VIEW COMPARISON:  09/18/2019 FINDINGS: Since the previous day's study, CABG surgery has been performed. Standard lines and tubes are in place: An endotracheal tube with its tip projecting approximately 2 cm above the carina, a nasal/orogastric tube passing well below the diaphragm into the stomach, a right internal jugular Swan-Ganz catheter, tip projecting in the right lower lobe pulmonary artery, a left internal jugular central venous line with its tip at the left brachiocephalic/superior vena cava confluence, and bilateral chest tubes. Improved lung aeration with decreased pleural effusions and a mild decrease in interstitial thickening. There is additional opacity at the medial lung bases consistent with atelectasis. No gross pneumothorax noted on this supine study. No mediastinal widening. IMPRESSION: 1. Status post CABG surgery since the previous exam. No evidence of an operative complication. 2. Improved congestive heart failure with a decrease in interstitial edema and pleural effusions. 3. Support apparatus as detailed. Electronically Signed   By: Lajean Manes M.D.   On: 09/19/2019 14:18   CARDIAC CATHETERIZATION  Result Date: 09/18/2019  Prox RCA lesion is 99% stenosed.  Dist RCA lesion is 100% stenosed.  Mid LM to Dist LM lesion is 99% stenosed.  Ost Cx to Prox Cx lesion is 99% stenosed.  Mid Cx to Dist Cx lesion is 60% stenosed.  LPAV lesion is 80% stenosed.  Ost LAD lesion is 90% stenosed.  Prox LAD to Mid LAD lesion is 80% stenosed.  Mid LAD lesion is 80% stenosed.  Findings: Ao = 155/78 (96) LV = 114/39 RA = 13 RV =  50/12 PA = 52/32 (41) PCW = 33 Fick cardiac output/index = 3.7/1.84 Thermo CO/CI = 3.7/1.87 PVR = 2.1 WU SVR = 1809 FA sat = 97% PA sat = 62%, 62% CPO = 0.78 (on milrinone 0.25) Assessment: 1. 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 Cardiogenic shock  Plan/Discussion: He has been stabilized with Impella support. Transfer to ICU. TCTS has seen. Likely emergent cath in am. Glori Bickers, MD 6:55 PM   DG Chest Port 1 View  Result Date: 09/20/2019 CLINICAL DATA:  Open heart surgery EXAM: PORTABLE CHEST 1 VIEW COMPARISON:  09/19/2019 FINDINGS: Endotracheal tube terminates 5 cm above the carina. Left IJ venous catheter terminates in the distal left brachiocephalic vein. Enteric tube courses into the stomach. Right IJ Swan-Ganz catheter terminates in the main pulmonary artery. Bilateral chest tubes and mediastinal drain. No pneumothorax is seen. Small left and layering small to moderate right pleural effusions, new/increased. Cardiomegaly.  Postsurgical changes related to prior CABG. IMPRESSION: Endotracheal tube terminates 5 cm above the carina. Bilateral chest tubes and mediastinal drain. No pneumothorax is seen. Small left and layering small to moderate right pleural effusions. Additional support apparatus as above. Electronically Signed   By: Julian Hy M.D.   On: 09/20/2019 07:40   DG CHEST PORT 1 VIEW  Result Date: 09/18/2019 CLINICAL DATA:  Congestive failure EXAM: PORTABLE CHEST 1 VIEW COMPARISON:  09/18/2018 FINDINGS: Impella device is now noted in satisfactory position. Swan-Ganz catheter is noted within the right pulmonary artery. Increasing central vascular congestion and edema is noted with bilateral pleural effusions worsened in the interval from the prior exam. IMPRESSION: Tubes and lines as described above Worsened congestive failure with effusions. Electronically Signed   By: Inez Catalina M.D.   On: 09/18/2019 21:05   ECHOCARDIOGRAM COMPLETE  Result Date: 09/18/2019    ECHOCARDIOGRAM REPORT   Patient Name:   Dennis Roberts Date of Exam: 09/18/2019 Medical Rec #:  [id number]    Height: Accession #:    [id number]   Weight: Date of Birth:  May 08, 1946     BSA: Patient Age:    6 years     BP:           131/103 mmHg Patient Gender: M             HR:           124 bpm. Exam Location:  Inpatient Procedure: 2D Echo Indications:     dyspnea 786.09  History:         Patient has no prior history of Echocardiogram examinations.  Sonographer:     Johny Chess Referring Phys:  [id number] [place] Diagnosing Phys: Mertie Moores MD IMPRESSIONS  1. Left ventricular ejection fraction, by estimation, is <20%. The left ventricle has severely decreased function. The left ventricle demonstrates regional wall motion abnormalities (see scoring diagram/findings for description). The left ventricular internal cavity size was moderately to severely dilated. Left ventricular diastolic parameters are consistent with Grade III diastolic dysfunction (restrictive). There is severe akinesis of the left ventricular, mid-apical anteroseptal wall and lateral wall.  2. Right ventricular systolic function is moderately reduced. The right ventricular size is normal. There is moderately elevated pulmonary artery systolic pressure.  3. The mitral valve is grossly normal. Trivial mitral valve regurgitation. No evidence of mitral stenosis.  4. The aortic valve is tricuspid. Aortic valve regurgitation is not visualized. No aortic stenosis is present. FINDINGS  Left Ventricle: Left ventricular ejection fraction, by estimation, is <20%. The left  ventricle has severely decreased function. The left ventricle demonstrates regional wall motion abnormalities. Severe akinesis of the left ventricular, mid-apical anteroseptal wall and lateral wall. The left ventricular internal cavity size was moderately to severely dilated. There is no left ventricular hypertrophy. Left ventricular diastolic parameters are consistent with Grade III diastolic dysfunction (restrictive). Right Ventricle: The right ventricular size is normal. No increase in right ventricular wall thickness. Right ventricular systolic function is moderately reduced. There is moderately elevated pulmonary artery systolic pressure.  The tricuspid regurgitant velocity is 3.66 m/s, and with an assumed right atrial pressure of 5 mmHg, the estimated right ventricular systolic pressure is 96.0 mmHg. Left Atrium: Left atrial size was normal in size. Right Atrium: Right atrial size was normal in size. Pericardium: Trivial pericardial effusion is present. Mitral Valve: The mitral valve is grossly normal. Trivial mitral valve regurgitation. No evidence of mitral valve stenosis. Tricuspid Valve: The tricuspid valve is normal in structure. Tricuspid valve regurgitation is not demonstrated. Aortic Valve: The aortic valve is tricuspid. . There is mild thickening and mild calcification of the aortic valve. Aortic valve regurgitation is not visualized. No aortic stenosis is present. Mild aortic valve annular calcification. There is mild thickening of the aortic valve. There is mild calcification of the aortic valve. Pulmonic Valve: The pulmonic valve was normal in structure. Pulmonic valve regurgitation is not visualized. No evidence of pulmonic stenosis. Aorta: The aortic root and ascending aorta are structurally normal, with no evidence of dilitation. IAS/Shunts: The atrial septum is grossly normal.  LEFT VENTRICLE PLAX 2D LVIDd:         5.40 cm LVIDs:         4.80 cm LV PW:         0.90 cm LV IVS:        0.90 cm LVOT diam:     2.20 cm LV SV:         20 LVOT Area:     3.80 cm  LV Volumes (MOD) LV vol d, MOD A4C: 99.1 ml LV vol s, MOD A4C: 72.9 ml LV SV MOD A4C:     99.1 ml RIGHT VENTRICLE RV S prime:     11.20 cm/s TAPSE (M-mode): 1.4 cm LEFT ATRIUM             RIGHT ATRIUM LA diam:        3.70 cm RA Area:     12.60 cm LA Vol (A2C):   64.4 ml RA Volume:   31.50 ml LA Vol (A4C):   34.7 ml LA Biplane Vol: 50.8 ml  AORTIC VALVE LVOT Vmax:   48.40 cm/s LVOT Vmean:  29.700 cm/s LVOT VTI:    0.052 m  AORTA Ao Root diam: 3.50 cm Ao Asc diam:  3.40 cm TRICUSPID VALVE TR Peak grad:   53.6 mmHg TR Vmax:        366.00 cm/s  SHUNTS Systemic VTI:  0.05 m Systemic  Diam: 2.20 cm Mertie Moores MD Electronically signed by Mertie Moores MD Signature Date/Time: 09/18/2019/4:56:32 PM    Final (Updated)    ECHO INTRAOPERATIVE TEE  Result Date: 09/19/2019  *INTRAOPERATIVE TRANSESOPHAGEAL REPORT *  Patient Name:   Dennis Roberts Date of Exam: 09/19/2019 Medical Rec #:  [id number]  Height:       65.5 in Accession #:    [id number] Weight:       162.9 lb Date of Birth:  [birth year]   BSA:          1.82 m  Patient Age:    61 years   BP:           117/97 mmHg Patient Gender: M          HR:           90 bpm. Exam Location:  Inpatient Transesophogeal exam was perform intraoperatively during surgical procedure. Patient was closely monitored under general anesthesia during the entirety of examination. Indications:     CAD Performing Phys: Roberts Gaudy MD Diagnosing Phys: Roberts Gaudy MD PRE-OP FINDINGS  Left Ventricle: Examination of LV function was limited due to the presence of a functioning impella LVAD. There was severe LV dysfunction. The ejection fraction was estimated at 20%. The anterior wall and anterior septum were akinetic to dyskinetic. the  lateral and inferior basal wall were hypokinetic. On the post-bypass examination, the LV systolic function appeared improved from the pre-bypass exam. The ejection fraction was estimated at 30% with some hypocontractility noted in the mid-anterior wall. The apex and distal anterior septum remained akinetic. There was improved contractility in the remaining LV segments. Right Ventricle: The RV was normal in size with normal RV systolic function. On the post bypass examination, there was normal RV size and systolic function. Left Atrium: The left atrium was mildly enlarged and measured 4.0 cm in the medial-lateral dimension. There was no thrombus within the LA cavity or LA appendage. The LAA emptying velocities were normal by PW Doppler.  Interatrial Septum: There was a patent foramen ovale present at the superior aspect of the fossa ovalis. There was  continuous left to right flow. Pericardium: There is no evidence of pericardial effusion. There is a large pleural effusion in both left and right lateral regions. Mitral Valve: The mitral leaflets were normal in thickness and coaptated normally without prolapsing, flail, or restricted segments. There was was trace to 1+ mitral insufficiency. The impella cannula was not inferring with mitral valve function. Tricuspid Valve: The tricuspid valve was normal in structure. Tricuspid valve regurgitation is trivial by color flow Doppler. Aortic Valve: The aortic valve was trileaflet. There was normal appearing leaflet thickness and leaflet mobility. There was an impella catheter crossing the aortic valve. There was trace aortic insufficiency. Upon initial examination the impella cannula appeared well positioned with the inflow port 2.84 cm from the aortic valve. The outflow port of the impella was 3 cm distal to the aortic valve. At the conclusion of the procedure, the impella inflow port was 2.55 cm proximal to the aortic valve and the ouflow port was approximately 3 cm distal to the aortic valve and there was no evidence of obstruction to either the inflow or ouflow ports. Pulmonic Valve: The pulmonic valve was normal in structure, with normal leaflet excursion. Pulmonic valve regurgitation is mild by color flow Doppler. Aorta: The proximal ascending aorta was normal in diameter without dilatation or effacement. The ascending aorta was examined with epiaortic ultrasound prior to aortic cannulation. At the site of cannulation, the aorta measured 3.28 cm in diameter with normal wall thickness and no atherosclerotic disease appreciated. The impella cannula was visualized within the aortic lumen. The descending aorta measured 2.1 cm in diameter with no evidnce of atherosclerotic disease appreciated. The impella cannula was seen within the lumen of descending thoracic aorta. Pulmonary Artery: The pulmonary artery is of normal  size.  Roberts Gaudy MD Electronically signed by Roberts Gaudy MD Signature Date/Time: 09/19/2019/6:04:42 PM    Final      Medications:     Scheduled Medications: .  acetaminophen  1,000 mg Oral Q6H   Or  . acetaminophen (TYLENOL) oral liquid 160 mg/5 mL  1,000 mg Per Tube Q6H  . aspirin EC  325 mg Oral Daily   Or  . aspirin  324 mg Per Tube Daily  . bisacodyl  10 mg Oral Daily   Or  . bisacodyl  10 mg Rectal Daily  . chlorhexidine  15 mL Mouth Rinse BID  . chlorhexidine gluconate (MEDLINE KIT)  15 mL Mouth Rinse BID  . Chlorhexidine Gluconate Cloth  6 each Topical Daily  . digoxin  0.125 mg Oral Daily  . docusate sodium  200 mg Oral Daily  . furosemide  40 mg Intravenous Once  . [START ON 09/21/2019] furosemide  80 mg Intravenous Daily  . mouth rinse  15 mL Mouth Rinse 10 times per day  . metoprolol tartrate  12.5 mg Oral BID   Or  . metoprolol tartrate  12.5 mg Per Tube BID  . [START ON 09/21/2019] pantoprazole  40 mg Oral Daily  . sodium chloride flush  10-40 mL Intracatheter Q12H  . sodium chloride flush  3 mL Intravenous Q12H  . sodium chloride flush  3 mL Intravenous Q12H  . sodium chloride flush  3 mL Intravenous Q12H  . sodium chloride flush  3 mL Intravenous Q12H  . spironolactone  12.5 mg Oral Daily    Infusions: . sodium chloride Stopped (09/19/19 1601)  . sodium chloride    . sodium chloride    . sodium chloride    . sodium chloride 10 mL/hr at 09/19/19 1609  . albumin human    . amiodarone 60 mg/hr (09/20/19 1300)   Followed by  . amiodarone    . cefUROXime (ZINACEF)  IV Stopped (09/20/19 0731)  . dexmedetomidine (PRECEDEX) IV infusion 0.7 mcg/kg/hr (09/20/19 1300)  . dextrose 5 % Impella 5.0 Purge solution 500 mL (09/19/19 2101)  . EPINEPHrine 4 mg in dextrose 5% 250 mL infusion (16 mcg/mL) 6 mcg/min (09/20/19 1300)  . impella catheter heparin 50 unit/mL in dextrose 5%    . insulin 2 mL/hr at 09/20/19 1300  . lactated ringers    . lactated ringers 20  mL/hr at 09/20/19 1300  . milrinone 0.25 mcg/kg/min (09/20/19 1300)  . nitroGLYCERIN Stopped (09/19/19 1558)  . norepinephrine (LEVOPHED) Adult infusion 22 mcg/min (09/20/19 1300)  . phenylephrine (NEO-SYNEPHRINE) Adult infusion Stopped (09/19/19 1559)  . vasopressin (PITRESSIN) infusion - *FOR SHOCK* 0.03 Units/min (09/20/19 1300)    PRN Medications: sodium chloride, sodium chloride, sodium chloride, acetaminophen, albumin human, ALPRAZolam, dextrose, metoprolol tartrate, midazolam, morphine injection, ondansetron (ZOFRAN) IV, oxyCODONE, sodium chloride flush, sodium chloride flush, sodium chloride flush, sodium chloride flush, traMADol, zolpidem   Assessment/Plan:   1. Acute systolic HF ->Cardiogenic shock - 09/18/19: Echo EF 20% RV ok - 09/18/19: Cath 3v CAD with 99% LM  - On multiple inotropes. Luiz Blare numbers reviewed personally.  - Will stop iNO. Cut VP down to 0.03 and wean as tolerated - Continue epi, NE and milrinone  - Continue impella P-6. Waveforms ok as above Site ok. LDH 413 - Diurese gently. - Likely can extubate today - Will need slow wean of support - D/w DR. Atkins at bedside.    2. 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 Acute hypoxic respiratory failure - mechanics look good post-op  - hopefully can  extubate today  4. Expected post-op blood loss - hgb 9.7  5. Low-grade fever - On cefuroxime   CRITICAL CARE Performed by: Glori Bickers  Total critical care time: 40 minutes  Critical care time was exclusive of separately billable procedures and treating other patients.  Critical care was necessary to treat or prevent imminent or life-threatening deterioration.  Critical care was time spent personally by me (independent of midlevel providers or residents) on the following activities: development of treatment plan with patient and/or  surrogate as well as nursing, discussions with consultants, evaluation of patient's response to treatment, examination of patient, obtaining history from patient or surrogate, ordering and performing treatments and interventions, ordering and review of laboratory studies, ordering and review of radiographic studies, pulse oximetry and re-evaluation of patient's condition.  Length of Stay: 2   Glori Bickers MD 09/20/2019, 1:35 PM  Advanced Heart Failure Team Pager [phone] (M-F; [place])  Please contact [facility] for night-coverage after hours (4p -7a ) and weekends on [email or web address]

## 2019-09-20 NOTE — Progress Notes (Signed)
  Echocardiogram 2D Echocardiogram has been performed.  Janalyn Harder 09/20/2019, 10:02 AM

## 2019-09-20 NOTE — Progress Notes (Signed)
ABG results reported to Dr Vickey Sages post TCTS Rapid Wean. Verbal order received to proceed with extubation.    Ref. Range 09/20/2019 18:14  Sample type Unknown ARTERIAL  pH, Arterial Latest Ref Range: 7.350 - 7.450  7.436  pCO2 arterial Latest Ref Range: 32.0 - 48.0 mmHg 32.9  pO2, Arterial Latest Ref Range: 83.0 - 108.0 mmHg 61.0 (L)  TCO2 Latest Ref Range: 22 - 32 mmol/L 23  Acid-base deficit Latest Ref Range: 0.0 - 2.0 mmol/L 2.0  Bicarbonate Latest Ref Range: 20.0 - 28.0 mmol/L 22.2  O2 Saturation Latest Units: % 92.0  Patient temperature Unknown 36.8 C  Collection site Unknown RADIAL, ALLEN'S TEST ACCEPTABLE

## 2019-09-20 NOTE — Addendum Note (Signed)
Addendum  created 09/20/19 1636 by Kipp Brood, MD   Clinical Note Signed

## 2019-09-20 NOTE — Progress Notes (Signed)
ANTICOAGULATION CONSULT NOTE  Pharmacy Consult for heparin Indication: Impella  Allergies  Allergen Reactions  . Penicillins   . Sulfa Antibiotics     Patient Measurements: Height: 5' 5.5" (166.4 cm) Weight: 177 lb 4 oz (80.4 kg) IBW/kg (Calculated) : 62.65 Heparin Dosing Weight: 70kg  Vital Signs: Temp: 97.9 F (36.6 C) (03/14 1925) Temp Source: Core (03/14 1200) BP: 89/73 (03/14 1900) Pulse Rate: 94 (03/14 1925)  Labs: Recent Labs    09/18/19 1300 09/18/19 1500 09/18/19 1520 09/19/19 0238 09/19/19 0431 09/19/19 1404 09/19/19 1408 09/20/19 0426 09/20/19 0426 09/20/19 1216 09/20/19 1614 09/20/19 1614 09/20/19 1814 09/20/19 1814 09/20/19 1858 09/20/19 1944 09/20/19 1946  HGB 15.2  --    < > 13.0   < > 10.8*   < > 9.7*   < >  --  8.8*   < > 7.8*   < > 8.9* 7.5*  --   HCT 46.0  --    < > 37.9*   < > 31.5*   < > 26.7*   < >  --  25.5*   < > 23.0*  --  25.5* 22.0*  --   PLT 179  --    < > 136*   < > 94*   < > 130*  --   --  74*  --   --   --  66*  --   --   APTT  --   --   --  48*  --  36  --  42*  --   --   --   --   --   --   --   --   --   LABPROT  --   --   --  15.3*  --  20.2*  --  18.9*  --   --   --   --   --   --   --   --   --   INR  --   --   --  1.2  --  1.7*  --  1.6*  --   --   --   --   --   --   --   --   --   HEPARINUNFRC  --   --   --  <0.10*  --   --   --   --   --   --   --   --   --   --   --   --  <0.10*  CREATININE 1.06  --    < > 1.09  --   --    < > 1.27*  --  1.13 1.12  --   --   --   --   --   --   TROPONINIHS 612* 641*  --   --   --   --   --   --   --   --   --   --   --   --   --   --   --    < > = values in this interval not displayed.    Estimated Creatinine Clearance: 58 mL/min (by C-G formula based on SCr of 1.12 mg/dL).  Assessment: 18 yoM admitted with cardiogenic shock s/p Impella CP placement 3/12, s/p CABG 3/13.   Heparin placed back purge solution 3/14, purge flow rate 13.93ml/hr = 665uts/hr heparin.  Heparin level < 0.1 - no  starting systemic anticoagulation yet.  PLTC fell over last 2 days - could be from hemolysis from device - will also check HIT.  Continue heparin for now and watch pltc  Goal of Therapy:  Heparin level 0.2-0.5 units/ml Monitor platelets by anticoagulation protocol: Yes   Plan:  Continue heparin in purge  Watch s/s bleeding Check HIT antibody  Monitor pltc/LDH  Bonnita Nasuti Pharm.D. CPP, BCPS Clinical Pharmacist [phone] 09/20/2019 8:42 PM

## 2019-09-20 NOTE — Discharge Summary (Signed)
Physician Discharge Summary  Patient ID: Dennis Roberts MRN: [id number] DOB/AGE: 08/08/1945 74 y.o.  Admit date: 09/18/2019 Discharge date: 10/01/2019  Admission Diagnoses: Multivessel coronary artery disease Acute non-ST elevation myocardial infarction Ischemic dilated cardiomyopathy Cardiogenic shock  Discharge Diagnoses:  Multivessel coronary artery disease Acute non-ST elevation myocardial infarction Ischemic dilated cardiomyopathy Cardiogenic shock Status post CABG x4 Status post closure of patent ductus arteriosus Post operative atrial fibrillation Acute 43mm right lacunar infarction    Discharged Condition: stable  History of Present Illness:     74 yo man previously healthy presented with relatively acute SOB. His work-up showed elevated BNP and troponin. 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 Consult received for CABG. Given the urgent nature of the presentation, it was agreed as a cardiogenic shock decision making unit that Impella should be placed via the groin and re-evaluate for surgery. He has stabilized since Impella was placed and is now clinically improved. Denies active CP and SOB is improved.  He remained stable overnight with the Impella in place.    Hospital Course: On the day after admission, he was taken to the operating room where urgent CABG x4  was carried out without complication.  Patent ductus arteriosus found on preoperative TEE was also closed at the time of surgery.  He separated from cardiopulmonary bypass with the Impella in place by way of the right groin.  This was gradually weaned down. He was also on nitric oxide as well as multiple vasoactive drips;specifically he was on Vasopressin, Neo synephrine, Epinephrine, Milrinone, Lasix, and Amiodarone drips. He remained hemodynamically stable as it support was gradually weaned. Vasopressin and Neo Synephrine were weaned off first. He was extubated 03/14.  He was then started on Nor Epinephrine drip the evening of 03/16. The Impella was weaned to P3 on 03/17 but because of decreased UO it was increased back to P6. Impella was then weaned again and then later removed on 03/18. He had a low grade fever was put on empiric Cefuroxime. Heart failure followed him post operatively. He had expected post op blood loss anemia. Lopressor was stopped 03/17. As of 03/20, he was also on ecasa 325 mg daily, Spironolactone 12.5 mg daily and Digoxin 0.125 mg daily, as well as and Milrinone drip. He was felt surgically stable for transfer from the ICU to floor for further convalescence on 03/22. Epicardia pacing wires were removed on 03/22. Chest tube sutures will be removed in the office after discharge. He has been tolerating a diet and has had a bowel movement. His wounds are clean, dry, and healing without signs of infection. Patient is ambulating independently on room air with good oxygenation. His cardiac rhythm is stable since converting back to NSR from atrial fibrillation.  He had persistent dizziness for a few days prior to discharge so an MRI of the brain was obtained. This showed a tiny, 84mm, lacunar density in the right operculum suspicious for acute infarct. By the day of discharge, the dizziness had resolved and he had no neurologic deficits.  This lesion was not felt to require any further intervention.  A referral was made for outpatient cardiac rehab at the time of discharge.    Consults:  Cardiology, advanced heart failure  Significant Diagnostic Studies:   ARTERIAL LINE INSERTION  RIGHT/LEFT HEART CATH AND CORONARY ANGIOGRAPHY  VENTRICULAR ASSIST DEVICE INSERTION  Conclusion    Prox RCA lesion is 99% stenosed.  Dist RCA lesion is 100% stenosed.  Mid LM to Dist LM lesion is 99% stenosed.  Ost Cx to Prox Cx lesion is 99% stenosed.  Mid Cx to Dist Cx lesion is 60% stenosed.  LPAV lesion is 80% stenosed.  Ost LAD lesion is 90%  stenosed.  Prox LAD to Mid LAD lesion is 80% stenosed.  Mid LAD lesion is 80% stenosed.   Findings:  Ao = 155/78 (96) LV = 114/39 RA = 13 RV = 50/12 PA = 52/32 (41) PCW = 33 Fick cardiac output/index = 3.7/1.84 Thermo CO/CI = 3.7/1.87 PVR = 2.1 WU SVR = 1809  FA sat = 97% PA sat = 62%, 62% CPO = 0.78 (on milrinone 0.25)  Assessment: 1. 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 Cardiogenic shock  Plan/Discussion:  He has been stabilized with Impella support. Transfer to ICU. TCTS has seen. Likely emergent cath in am.  Arvilla Meresaniel Bensimhon, MD  6:55 PM     Surgeon Notes    09/19/2019 4:31 PM Op Note signed by Linden DolinAtkins, Broadus Z, MD    09/19/2019 1:51 PM Operative Note - Scan signed by Default, Provider, MD  Indications  Cardiogenic shock (HCC) [R57.0 (ICD-10-CM)]  Procedural Details  Technical Details The patient is 74 year old male with no past medical history.  He came to the emergency room today complaining of a 5-day history of progressive shortness of breath.  He was seen by Dr. Anne FuSkains in the ER and thought to have cardiogenic shock in the setting of probable multivessel coronary artery disease.  Echocardiogram performed emergently showed an ejection fraction of approximately 20%.  We brought emergently to the catheterization lab for further evaluation.  On arrival to the catheterization lab he was profoundly dyspneic and orthopneic.  He was mottled.  He was started emergently on milrinone and BiPAP for support.  He was evaluated by anesthesia for emergent intubation.  However he improved significantly on milrinone and BiPAP.  A full timeout was taken.  The right neck and right wrist area were prepped and draped in routine sterile fashion.  Ultrasound guidance an 8 French sheath was placed in the right internal jugular vein.  A VIP Ernestine ConradSwan was used and a right heart cath was performed.  Our attention was then turned to the right radial artery.  A 6 French sheath was placed  using a modified Seldinger technique.  Intravenous verapamil was given through the sheath.  A left heart catheterization and coronary angiography was performed using standard catheters including a JR4 and JL 3.5.  4000 units of heparin was given in the procedure.  He was found to have multivessel coronary artery disease and cardiogenic shock.  TCTS was contacted and Dr. Tyrone SageGerhardt valuated the patient with us in the catheterization lab.  And his severely elevated filling pressures, the decision was made to place an Impella CP device with ventricular unloading and shock support prior to going to the OR.  The right groin area was prepped and draped in routine sterile fashion.  The area was anesthetized with 1% lidocaine.  Using ultrasound guidance a 6 French arterial sheath was placed in the right femoral artery.  A femoral artery angiogram was taken which showed adequate placement and no significant iliofemoral disease.  The 6 French sheath was removed over a wire and serial dilations were made and a 14 French Impella sheath was placed.  There was good hemostasis.  A standard 6 French pigtail was then used to cross the aortic valve.  The Impella placement wire was then fed through  the pigtail and into the LV apex.  The pigtail was removed.  Additional 4000 units of heparin was given.  Impella CP device was then placed over the wire into the left ventricle under fluoroscopic guidance.  The wire was then removed.  Impella support was then initiated.  There were good flows and excellent waveforms.  The peel-away sheath was then pulled back from the body and removed.  The repositioning sheath was advanced.  Patient did develop a hematoma at the site.  Manual pressure was held and the device was sutured into place.  A FemoStop was placed with moderate pressure to prevent recurrent hematoma formation.  The left wrist was then prepped and draped in routine sterile fashion.  Using ultrasound guidance a 20-gauge arterial  sheath was placed in the left radial artery.  There was good blood flow.  It was attached to pressure tubing.  A sterile dressing was placed.  The patient was then transferred to the CCU in stable but critical condition. Estimated blood loss <50 mL.   During this procedure medications were administered to achieve and maintain moderate conscious sedation while the patient's heart rate, blood pressure, and oxygen saturation were continuously monitored and I was present face-to-face 100% of this time.  Medications (Filter: Administrations occurring from 09/18/19 1540 to 09/18/19 1830) (important)  Continuous medications are totaled by the amount administered until 09/18/19 1830.  Heparin (Porcine) in NaCl 1000-0.9 UT/500ML-% SOLN (mL) Total volume:  1,500 mL  Date/Time  Rate/Dose/Volume Action  09/18/19 1601  500 mL Given  1619  500 mL Given  1710  500 mL Given    lidocaine (PF) (XYLOCAINE) 1 % injection (mL) Total volume:  14 mL  Date/Time  Rate/Dose/Volume Action  09/18/19 1605  2 mL Given  1620  2 mL Given  1658  10 mL Given    fentaNYL (SUBLIMAZE) injection (mcg) Total dose:  75 mcg  Date/Time  Rate/Dose/Volume Action  09/18/19 1608  25 mcg Given  1709  25 mcg Given  1721  25 mcg Given    Radial Cocktail/Verapamil only (mL) Total volume:  10 mL  Date/Time  Rate/Dose/Volume Action  09/18/19 1622  10 mL Given    heparin injection (Units) Total dose:  7,500 Units  Date/Time  Rate/Dose/Volume Action  09/18/19 1624  3,500 Units Given  1721  4,000 Units Given    midazolam (VERSED) injection (mg) Total dose:  2 mg  Date/Time  Rate/Dose/Volume Action  09/18/19 1709  1 mg Given  1721  1 mg Given    norepinephrine (LEVOPHED) 4 mg in dextrose 5 % 250 mL (0.016 mg/mL) infusion (mcg/min) Total dose:  46.42 mcg Dosing weight:  70  Date/Time  Rate/Dose/Volume Action  09/18/19 1732  5 mcg/min - 18.75 mL/hr New Bag/Given  1734  2 mcg/min - 7.5 mL/hr Rate/Dose Change  1753    Stopped    iohexol (OMNIPAQUE) 350 MG/ML injection (mL) Total volume:  70 mL  Date/Time  Rate/Dose/Volume Action  09/18/19 1802  70 mL Given    sodium chloride flush (NS) 0.9 % injection 3 mL (mL) Total dose:  Cannot be calculated*  *Administration dose not documented Date/Time  Rate/Dose/Volume Action  09/18/19 1540  *Not included in total MAR Hold    milrinone (PRIMACOR) 20 MG/100 ML (0.2 mg/mL) infusion (mcg/kg/min) Total dose:  Cannot be calculated* Dosing weight:  70  *Continuous medication not stopped within the calculation time range. Date/Time  Rate/Dose/Volume Action  09/18/19 1557  0.25 mcg/kg/min - 5.25  mL/hr New Bag/Given    heparin 50,000 Units in dextrose 5 % 1,000 mL (50 Units/mL) (mL/hr) Total volume:  3.81 mL Dosing weight:  70  Date/Time  Rate/Dose/Volume Action  09/18/19 1815  50,000 Units - 14.8 mL/hr New Bag/Given    hydrALAZINE (APRESOLINE) injection 10 mg (mg) Total dose:  Cannot be calculated*  *Administration dose not documented Date/Time  Rate/Dose/Volume Action  09/18/19 1540  *Not included in total MAR Hold    milrinone (PRIMACOR) 20 MG/100 ML (0.2 mg/mL) infusion (mcg/kg/min) Total dose:  0.01 mg Dosing weight:  70  Date/Time  Rate/Dose/Volume Action  09/18/19 1830  0.25 mcg/kg/min - 5.25 mL/hr New Bag/Given    Sedation Time  Sedation Time Physician-1: 1 hour 52 minutes 30 seconds  Contrast  Medication Name Total Dose  iohexol (OMNIPAQUE) 350 MG/ML injection 70 mL    Radiation/Fluoro  Fluoro time: 9.3 (min) DAP: 32948 (mGycm2) Cumulative Air Kerma: 422 (mGy)  Coronary Findings  Diagnostic Dominance: Left Left Main  Mid LM to Dist LM lesion 99% stenosed  Mid LM to Dist LM lesion is 99% stenosed.  Left Anterior Descending  Ost LAD lesion 90% stenosed  Ost LAD lesion is 90% stenosed.  Prox LAD to Mid LAD lesion 80% stenosed  Prox LAD to Mid LAD lesion is 80% stenosed.  Mid LAD lesion 80% stenosed  Mid LAD lesion is 80%  stenosed.  Left Circumflex  Ost Cx to Prox Cx lesion 99% stenosed  Ost Cx to Prox Cx lesion is 99% stenosed. The lesion is calcified.  Mid Cx to Dist Cx lesion 60% stenosed  Mid Cx to Dist Cx lesion is 60% stenosed.  Left Posterior Atrioventricular Artery  LPAV lesion 80% stenosed  LPAV lesion is 80% stenosed.  Right Coronary Artery  Prox RCA lesion 99% stenosed  Prox RCA lesion is 99% stenosed.  Dist RCA lesion 100% stenosed  Dist RCA lesion is 100% stenosed.  Intervention  No interventions have been documented. Coronary Diagrams  Diagnostic Dominance: Left     ECHOCARDIOGRAM REPORT       Patient Name:  Dennis Roberts Mccurley Date of Exam: 09/18/2019  Medical Rec #: [id number]  Height:  Accession #:  [id number]  Weight:  Date of Birth: [birth year]   BSA:  Patient Age:  74 years   BP:      131/103 mmHg  Patient Gender: M      HR:      124 bpm.  Exam Location: Inpatient   Procedure: 2D Echo   Indications:   dyspnea 786.09    History:     Patient has no prior history of Echocardiogram  examinations.    Sonographer:   Delcie Roch  Referring Phys: [id number] CADENCE H FURTH  Diagnosing Phys: Kristeen Miss MD   IMPRESSIONS    1. Left ventricular ejection fraction, by estimation, is <20%. The left  ventricle has severely decreased function. The left ventricle demonstrates  regional wall motion abnormalities (see scoring diagram/findings for  description). The left ventricular  internal cavity size was moderately to severely dilated. Left ventricular  diastolic parameters are consistent with Grade III diastolic dysfunction  (restrictive). There is severe akinesis of the left ventricular,  mid-apical anteroseptal wall and lateral  wall.  2. Right ventricular systolic function is moderately reduced. The right  ventricular size is normal. There is moderately elevated pulmonary artery  systolic pressure.  3. The mitral valve is  grossly normal. Trivial mitral valve  regurgitation. No evidence of mitral stenosis.  4. The aortic valve is tricuspid. Aortic valve regurgitation is not  visualized. No aortic stenosis is present.   Treatments:   CARDIOTHORACIC SURGERY OPERATIVE NOTE  Date of Procedure:    09/19/2019  Preoperative Diagnosis:      Severe 3-vessel Coronary Artery Disease including LM CAD with severely depressed LV function  Postoperative Diagnosis:    Same  Procedure:        Coronary Artery Bypass Grafting x 4             Left Internal Mammary Artery to Distal Left Anterior Descending Coronary Artery; Saphenous Vein Graft to left Posterior Descending Coronary Artery and 1st Obtuse Marginal Branch of Left Circumflex Coronary Artery as a sequenced graft; Sapheonous Vein Graft to 1st Diagonal Branch Coronary Artery; Endoscopic Vein Harvest from rightThigh and Lower Leg PFO closure Completion indocyanine green fluorescence imaging (SPY) Epiaortic ultrasonography  Surgeon:        B. Lorayne Marek, MD  Assistant:       Gaynelle Arabian PA-C  Anesthesia:    get  Operative Findings: ? Severely reduced left ventricular systolic function ? Good quality left internal mammary artery conduit ? Good quality saphenous vein conduit ? Good quality, small diameter target vessels for grafting    BRIEF CLINICAL NOTE AND INDICATIONS FOR SURGERY  74 year old gentleman presented with decompensated heart failure after a brief period of clinical evaluation.  He underwent left heart catheterization for heart failure and NSTEMI yesterday.  Demonstrated severe multivessel disease and severely reduced left ventricular function.  An Impella device was placed via the right groin to stabilize overnight.  He was taken to the operating room for surgical revascularization.   Discharge Exam: Blood pressure 106/61, pulse 79, temperature 97.7 F (36.5 C), temperature source Oral, resp. rate 20, height 5' 5.5" (1.664  m), weight 69.1 kg, SpO2 98 %.  Physical Exam General appearance:alert, cooperative and no distress Neurologic:intact Heart:regular rate and rhythm Lungs:Breath sounds clear, O2 sat 98% on RA.  Extremities:All well perfused with no peripheral edema. Wound:the sternotomy incision and RLE EVH incisions are intact and dry.  Disposition:   Discharge Instructions    Amb Referral to Cardiac Rehabilitation   Complete by: As directed    Diagnosis: CABG   CABG X ___: 4   After initial evaluation and assessments completed: Virtual Based Care may be provided alone or in conjunction with Phase 2 Cardiac Rehab based on patient barriers.: Yes     Allergies as of 10/01/2019      Reactions   Penicillins    Sulfa Antibiotics       Medication List    STOP taking these medications   ibuprofen 200 MG tablet Commonly known as: ADVIL     TAKE these medications   amiodarone 200 MG tablet Commonly known as: PACERONE Take 1 tablet (200 mg total) by mouth daily.   aspirin 325 MG EC tablet Take 1 tablet (325 mg total) by mouth daily. Start taking on: October 02, 2019   digoxin 0.125 MG tablet Commonly known as: LANOXIN Take 1 tablet (0.125 mg total) by mouth daily. Start taking on: October 02, 2019   furosemide 40 MG tablet Commonly known as: Lasix Take 1 tablet (40 mg total) by mouth daily as needed (for weight gain of 2 pounds or more in 24 hours).   lactobacillus acidophilus Tabs tablet Take 1 tablet by mouth daily.   loperamide 2 MG capsule Commonly known as: IMODIUM Take 1 capsule (2 mg total) by mouth  as needed for diarrhea or loose stools.   losartan 25 MG tablet Commonly known as: COZAAR Take 0.5 tablets (12.5 mg total) by mouth at bedtime.   multivitamin capsule Take 1 capsule by mouth daily.   OVER THE COUNTER MEDICATION Apply 1 application topically daily as needed (pain). CBD cream   potassium chloride SA 20 MEQ tablet Commonly known as: KLOR-CON Take 1  tablet (20 mEq total) by mouth daily as needed (If taking Lasix (furosemide) for weight gain, take Potassium Chloride tablet with the Lasix (furosemide)).   spironolactone 25 MG tablet Commonly known as: ALDACTONE Take 0.5 tablets (12.5 mg total) by mouth at bedtime.   traMADol 50 MG tablet Commonly known as: ULTRAM Take 1 tablet (50 mg total) by mouth every 6 (six) hours as needed for up to 7 days for moderate pain.      Follow-up Information    [facility] Follow up on 10/08/2019.   Specialty: Cardiology Why: 9:30 AM [facility]  Parking Garage Code 5009 Contact information: [address] [id number] mc [address] [place] [address] [phone]       Linden Dolin, MD. Go on 10/11/2019.   Specialty: Cardiothoracic Surgery Why: You have a follow up appointment with Dr. Vickey Sages on Monday 10/12/19 at 11:00am. Please arrive 30 minutes early for a chest xray to be performed by [facility] located on the first floor of the same building. Contact information: [address] [place] [address] [phone]          The patient has been discharged on:   1.Beta Blocker:  Yes [ x  ]                              No   [   ]                              If No, reason:  2.Ace Inhibitor/ARB: Yes [x   ]                                     No  [    ]                                     If No, reason:  3.Statin:   Yes [ x  ]                  No  [   ]                  If No, reason:  4.Ecasa:  Yes  [x   ]                  No   [   ]                  If No, reason:    Signed: Leary Roca, PA-C 10/01/2019, 12:15 PM

## 2019-09-20 NOTE — Procedures (Signed)
Extubation Procedure Note  Patient Details:   Name: Dennis Roberts DOB: February 23, 1946 MRN: [id number]   Airway Documentation:    Vent end date: 09/20/19 Vent end time: 1825   Evaluation  O2 sats: stable throughout Complications: No apparent complications Patient did tolerate procedure well. Bilateral Breath Sounds: Clear, Diminished   Yes   Pt extubated per MD order and SICU protocol.  NIF -45, VC 1.0L with good pt effort.  RN called ABG to MD and he gave order to extubate.  PT placed on 5L [place]. Nitric is off.  RT will continue to monitor.  Ronny Flurry 09/20/2019, 6:31 PM

## 2019-09-20 NOTE — Progress Notes (Signed)
1 Day Post-Op Procedure(s) (LRB): CORONARY ARTERY BYPASS GRAFTING (CABG) using LIMA to LAD; Endoscopic harvest right saphenous vein: SVG tp Diag; SVG sequential to OM1 and OM2. (N/A) TRANSESOPHAGEAL ECHOCARDIOGRAM (TEE) (N/A) Endovein Harvest Of Greater Saphenous Vein (Right) CLOSURE OF PATENT DUCTUS ARTERIOSUS (N/A) Subjective: Intubated/sedated  Objective: Vital signs in last 24 hours: Temp:  [94.5 F (34.7 C)-100.8 F (38.2 C)] 99.9 F (37.7 C) (03/14 0700) Pulse Rate:  [61-119] 88 (03/14 0806) Cardiac Rhythm: Sinus tachycardia (03/13 2000) Resp:  [14-36] 20 (03/14 0806) BP: (82-95)/(57-74) 92/74 (03/14 0700) SpO2:  [90 %-100 %] 100 % (03/14 0700) Arterial Line BP: (76-119)/(50-72) 98/63 (03/14 0700) FiO2 (%):  [50 %] 50 % (03/14 0806) Weight:  [80.4 kg] 80.4 kg (03/14 0600)  Hemodynamic parameters for last 24 hours: PAP: (17-44)/(11-28) 32/21 CVP:  [8 mmHg-12 mmHg] 10 mmHg CO:  [3.9 L/min-4.8 L/min] 3.9 L/min CI:  [2.2 L/min/m2-2.7 L/min/m2] 2.2 L/min/m2  Intake/Output from previous day: 03/13 0701 - 03/14 0700 In: 3532 [I.V.:4567.1; Blood:905; NG/GT:120; IV Piggyback:1044.8] Out: 5706 [Urine:4195; Emesis/NG output:50; Blood:471; Chest Tube:990] Intake/Output this shift: Total I/O In: 248.9 [I.V.:118.2; Other:14.1; IV Piggyback:116.5] Out: 210 [Urine:110; Chest Tube:100]  General appearance: intubated/sedated Neurologic: intact Heart: regular rate and rhythm, S1, S2 normal, no murmur, click, rub or gallop Lungs: diminished breath sounds bilaterally Abdomen: soft, non-tender; bowel sounds normal; no masses,  no organomegaly Extremities: edema 1+ Wound: c/d/i stable support devices  Lab Results: Recent Labs    09/19/19 1944 09/19/19 1953 09/20/19 0337 09/20/19 0426  WBC 10.1  --   --  10.6*  HGB 9.3*   < > 8.2* 9.7*  HCT 27.2*   < > 24.0* 26.7*  PLT 103*  --   --  130*   < > = values in this interval not displayed.   BMET:  Recent Labs    09/19/19 1944  09/19/19 1953 09/20/19 0337 09/20/19 0426  NA 136   < > 135 134*  K 4.0   < > 3.7 3.7  CL 106  --   --  105  CO2 21*  --   --  20*  GLUCOSE 152*  --   --  117*  BUN 23  --   --  24*  CREATININE 1.22  --   --  1.27*  CALCIUM 7.8*  --   --  8.0*   < > = values in this interval not displayed.    PT/INR:  Recent Labs    09/20/19 0426  LABPROT 18.9*  INR 1.6*   ABG    Component Value Date/Time   PHART 7.428 09/20/2019 0337   HCO3 21.6 09/20/2019 0337   TCO2 23 09/20/2019 0337   ACIDBASEDEF 2.0 09/20/2019 0337   O2SAT 99.0 09/20/2019 0337   CBG (last 3)  Recent Labs    09/20/19 0434 09/20/19 0614 09/20/19 0656  GLUCAP 121* 135* 138*    Assessment/Plan: S/P Procedure(s) (LRB): CORONARY ARTERY BYPASS GRAFTING (CABG) using LIMA to LAD; Endoscopic harvest right saphenous vein: SVG tp Diag; SVG sequential to OM1 and OM2. (N/A) TRANSESOPHAGEAL ECHOCARDIOGRAM (TEE) (N/A) Endovein Harvest Of Greater Saphenous Vein (Right) CLOSURE OF PATENT DUCTUS ARTERIOSUS (N/A) Diuresis wean drips and wean iNO   LOS: 2 days    Linden Dolin 09/20/2019

## 2019-09-21 ENCOUNTER — Inpatient Hospital Stay (HOSPITAL_COMMUNITY): Payer: Medicare Other

## 2019-09-21 LAB — POCT I-STAT 7, (LYTES, BLD GAS, ICA,H+H)
Acid-Base Excess: 2 mmol/L (ref 0.0–2.0)
Acid-base deficit: 1 mmol/L (ref 0.0–2.0)
Bicarbonate: 24.3 mmol/L (ref 20.0–28.0)
Bicarbonate: 26.5 mmol/L (ref 20.0–28.0)
Bicarbonate: 24.1 mmol/L (ref 20.0–28.0)
Calcium, Ion: 1.18 mmol/L (ref 1.15–1.40)
Calcium, Ion: 1.01 mmol/L — ABNORMAL LOW (ref 1.15–1.40)
Calcium, Ion: 1.19 mmol/L (ref 1.15–1.40)
HCT: 37 % — ABNORMAL LOW (ref 39.0–52.0)
HCT: 25 % — ABNORMAL LOW (ref 39.0–52.0)
HCT: 26 % — ABNORMAL LOW (ref 39.0–52.0)
Hemoglobin: 12.6 g/dL — ABNORMAL LOW (ref 13.0–17.0)
Hemoglobin: 8.5 g/dL — ABNORMAL LOW (ref 13.0–17.0)
Hemoglobin: 8.8 g/dL — ABNORMAL LOW (ref 13.0–17.0)
O2 Saturation: 100 %
O2 Saturation: 100 %
O2 Saturation: 100 %
Potassium: 3.9 mmol/L (ref 3.5–5.1)
Potassium: 3.5 mmol/L (ref 3.5–5.1)
Potassium: 3.8 mmol/L (ref 3.5–5.1)
Sodium: 136 mmol/L (ref 135–145)
Sodium: 136 mmol/L (ref 135–145)
Sodium: 136 mmol/L (ref 135–145)
TCO2: 25 mmol/L (ref 22–32)
TCO2: 28 mmol/L (ref 22–32)
TCO2: 25 mmol/L (ref 22–32)
pCO2 arterial: 39 mmHg (ref 32.0–48.0)
pCO2 arterial: 42.5 mmHg (ref 32.0–48.0)
pCO2 arterial: 39.4 mmHg (ref 32.0–48.0)
pH, Arterial: 7.402 (ref 7.350–7.450)
pH, Arterial: 7.404 (ref 7.350–7.450)
pH, Arterial: 7.393 (ref 7.350–7.450)
pO2, Arterial: 179 mmHg — ABNORMAL HIGH (ref 83.0–108.0)
pO2, Arterial: 296 mmHg — ABNORMAL HIGH (ref 83.0–108.0)
pO2, Arterial: 209 mmHg — ABNORMAL HIGH (ref 83.0–108.0)

## 2019-09-21 LAB — POCT I-STAT, CHEM 8
BUN: 30 mg/dL — ABNORMAL HIGH (ref 8–23)
BUN: 28 mg/dL — ABNORMAL HIGH (ref 8–23)
BUN: 25 mg/dL — ABNORMAL HIGH (ref 8–23)
BUN: 25 mg/dL — ABNORMAL HIGH (ref 8–23)
BUN: 27 mg/dL — ABNORMAL HIGH (ref 8–23)
BUN: 26 mg/dL — ABNORMAL HIGH (ref 8–23)
Calcium, Ion: 1.18 mmol/L (ref 1.15–1.40)
Calcium, Ion: 1.19 mmol/L (ref 1.15–1.40)
Calcium, Ion: 0.99 mmol/L — ABNORMAL LOW (ref 1.15–1.40)
Calcium, Ion: 0.98 mmol/L — ABNORMAL LOW (ref 1.15–1.40)
Calcium, Ion: 1.02 mmol/L — ABNORMAL LOW (ref 1.15–1.40)
Calcium, Ion: 1.19 mmol/L (ref 1.15–1.40)
Chloride: 100 mmol/L (ref 98–111)
Chloride: 98 mmol/L (ref 98–111)
Chloride: 95 mmol/L — ABNORMAL LOW (ref 98–111)
Chloride: 97 mmol/L — ABNORMAL LOW (ref 98–111)
Chloride: 97 mmol/L — ABNORMAL LOW (ref 98–111)
Chloride: 98 mmol/L (ref 98–111)
Creatinine, Ser: 1.1 mg/dL (ref 0.61–1.24)
Creatinine, Ser: 1 mg/dL (ref 0.61–1.24)
Creatinine, Ser: 0.9 mg/dL (ref 0.61–1.24)
Creatinine, Ser: 1 mg/dL (ref 0.61–1.24)
Creatinine, Ser: 1 mg/dL (ref 0.61–1.24)
Creatinine, Ser: 1.1 mg/dL (ref 0.61–1.24)
Glucose, Bld: 158 mg/dL — ABNORMAL HIGH (ref 70–99)
Glucose, Bld: 158 mg/dL — ABNORMAL HIGH (ref 70–99)
Glucose, Bld: 143 mg/dL — ABNORMAL HIGH (ref 70–99)
Glucose, Bld: 166 mg/dL — ABNORMAL HIGH (ref 70–99)
Glucose, Bld: 176 mg/dL — ABNORMAL HIGH (ref 70–99)
Glucose, Bld: 167 mg/dL — ABNORMAL HIGH (ref 70–99)
HCT: 35 % — ABNORMAL LOW (ref 39.0–52.0)
HCT: 34 % — ABNORMAL LOW (ref 39.0–52.0)
HCT: 25 % — ABNORMAL LOW (ref 39.0–52.0)
HCT: 22 % — ABNORMAL LOW (ref 39.0–52.0)
HCT: 24 % — ABNORMAL LOW (ref 39.0–52.0)
HCT: 22 % — ABNORMAL LOW (ref 39.0–52.0)
Hemoglobin: 11.9 g/dL — ABNORMAL LOW (ref 13.0–17.0)
Hemoglobin: 11.6 g/dL — ABNORMAL LOW (ref 13.0–17.0)
Hemoglobin: 8.5 g/dL — ABNORMAL LOW (ref 13.0–17.0)
Hemoglobin: 7.5 g/dL — ABNORMAL LOW (ref 13.0–17.0)
Hemoglobin: 8.2 g/dL — ABNORMAL LOW (ref 13.0–17.0)
Hemoglobin: 7.5 g/dL — ABNORMAL LOW (ref 13.0–17.0)
Potassium: 3.9 mmol/L (ref 3.5–5.1)
Potassium: 3.7 mmol/L (ref 3.5–5.1)
Potassium: 3.6 mmol/L (ref 3.5–5.1)
Potassium: 4.3 mmol/L (ref 3.5–5.1)
Potassium: 4.7 mmol/L (ref 3.5–5.1)
Potassium: 3.8 mmol/L (ref 3.5–5.1)
Sodium: 135 mmol/L (ref 135–145)
Sodium: 135 mmol/L (ref 135–145)
Sodium: 136 mmol/L (ref 135–145)
Sodium: 133 mmol/L — ABNORMAL LOW (ref 135–145)
Sodium: 134 mmol/L — ABNORMAL LOW (ref 135–145)
Sodium: 135 mmol/L (ref 135–145)
TCO2: 25 mmol/L (ref 22–32)
TCO2: 25 mmol/L (ref 22–32)
TCO2: 27 mmol/L (ref 22–32)
TCO2: 27 mmol/L (ref 22–32)
TCO2: 30 mmol/L (ref 22–32)
TCO2: 26 mmol/L (ref 22–32)

## 2019-09-21 LAB — CBC WITH DIFFERENTIAL/PLATELET
Abs Immature Granulocytes: 0.04 10*3/uL (ref 0.00–0.07)
Basophils Absolute: 0 10*3/uL (ref 0.0–0.1)
Basophils Relative: 0 %
Eosinophils Absolute: 0 10*3/uL (ref 0.0–0.5)
Eosinophils Relative: 0 %
HCT: 24.1 % — ABNORMAL LOW (ref 39.0–52.0)
Hemoglobin: 8.1 g/dL — ABNORMAL LOW (ref 13.0–17.0)
Immature Granulocytes: 1 %
Lymphocytes Relative: 19 %
Lymphs Abs: 1.6 10*3/uL (ref 0.7–4.0)
MCH: 30.6 pg (ref 26.0–34.0)
MCHC: 33.6 g/dL (ref 30.0–36.0)
MCV: 90.9 fL (ref 80.0–100.0)
Monocytes Absolute: 0.8 10*3/uL (ref 0.1–1.0)
Monocytes Relative: 9 %
Neutro Abs: 6.1 10*3/uL (ref 1.7–7.7)
Neutrophils Relative %: 71 %
Platelets: 56 10*3/uL — ABNORMAL LOW (ref 150–400)
RBC: 2.65 MIL/uL — ABNORMAL LOW (ref 4.22–5.81)
RDW: 14.7 % (ref 11.5–15.5)
WBC: 8.6 10*3/uL (ref 4.0–10.5)
nRBC: 0 % (ref 0.0–0.2)

## 2019-09-21 LAB — POCT I-STAT EG7
Acid-base deficit: 1 mmol/L (ref 0.0–2.0)
Bicarbonate: 23.9 mmol/L (ref 20.0–28.0)
Calcium, Ion: 1.18 mmol/L (ref 1.15–1.40)
HCT: 22 % — ABNORMAL LOW (ref 39.0–52.0)
Hemoglobin: 7.5 g/dL — ABNORMAL LOW (ref 13.0–17.0)
O2 Saturation: 86 %
Patient temperature: 37
Potassium: 3.7 mmol/L (ref 3.5–5.1)
Sodium: 137 mmol/L (ref 135–145)
TCO2: 25 mmol/L (ref 22–32)
pCO2, Ven: 42.7 mmHg — ABNORMAL LOW (ref 44.0–60.0)
pH, Ven: 7.356 (ref 7.250–7.430)
pO2, Ven: 53 mmHg — ABNORMAL HIGH (ref 32.0–45.0)

## 2019-09-21 LAB — GLUCOSE, CAPILLARY
Glucose-Capillary: 127 mg/dL — ABNORMAL HIGH (ref 70–99)
Glucose-Capillary: 99 mg/dL (ref 70–99)
Glucose-Capillary: 131 mg/dL — ABNORMAL HIGH (ref 70–99)
Glucose-Capillary: 118 mg/dL — ABNORMAL HIGH (ref 70–99)
Glucose-Capillary: 119 mg/dL — ABNORMAL HIGH (ref 70–99)
Glucose-Capillary: 125 mg/dL — ABNORMAL HIGH (ref 70–99)
Glucose-Capillary: 126 mg/dL — ABNORMAL HIGH (ref 70–99)
Glucose-Capillary: 118 mg/dL — ABNORMAL HIGH (ref 70–99)
Glucose-Capillary: 105 mg/dL — ABNORMAL HIGH (ref 70–99)
Glucose-Capillary: 126 mg/dL — ABNORMAL HIGH (ref 70–99)
Glucose-Capillary: 119 mg/dL — ABNORMAL HIGH (ref 70–99)
Glucose-Capillary: 104 mg/dL — ABNORMAL HIGH (ref 70–99)
Glucose-Capillary: 127 mg/dL — ABNORMAL HIGH (ref 70–99)
Glucose-Capillary: 98 mg/dL (ref 70–99)
Glucose-Capillary: 129 mg/dL — ABNORMAL HIGH (ref 70–99)
Glucose-Capillary: 125 mg/dL — ABNORMAL HIGH (ref 70–99)
Glucose-Capillary: 105 mg/dL — ABNORMAL HIGH (ref 70–99)
Glucose-Capillary: 107 mg/dL — ABNORMAL HIGH (ref 70–99)

## 2019-09-21 LAB — POCT ACTIVATED CLOTTING TIME
Activated Clotting Time: 142 seconds
Activated Clotting Time: 136 seconds

## 2019-09-21 LAB — BASIC METABOLIC PANEL
Anion gap: 9 (ref 5–15)
BUN: 22 mg/dL (ref 8–23)
CO2: 21 mmol/L — ABNORMAL LOW (ref 22–32)
Calcium: 8 mg/dL — ABNORMAL LOW (ref 8.9–10.3)
Chloride: 103 mmol/L (ref 98–111)
Creatinine, Ser: 1.05 mg/dL (ref 0.61–1.24)
GFR calc Af Amer: >60 mL/min (ref >60)
GFR calc non Af Amer: >60 mL/min (ref >60)
Glucose, Bld: 129 mg/dL — ABNORMAL HIGH (ref 70–99)
Potassium: 4.2 mmol/L (ref 3.5–5.1)
Sodium: 133 mmol/L — ABNORMAL LOW (ref 135–145)

## 2019-09-21 LAB — COOXEMETRY PANEL
Carboxyhemoglobin: 1.4 % (ref 0.5–1.5)
Methemoglobin: 1.3 % (ref 0.0–1.5)
O2 Saturation: 57 %
Total hemoglobin: 7.8 g/dL — ABNORMAL LOW (ref 12.0–16.0)

## 2019-09-21 LAB — LACTATE DEHYDROGENASE: LDH: 281 U/L — ABNORMAL HIGH (ref 98–192)

## 2019-09-21 LAB — HEPARIN LEVEL (UNFRACTIONATED): Heparin Unfractionated: <0.1 IU/mL — ABNORMAL LOW (ref 0.30–0.70)

## 2019-09-21 MED ORDER — INSULIN ASPART 100 UNIT/ML ~~LOC~~ SOLN: 0.0000 [IU] | SUBCUTANEOUS | Status: DC

## 2019-09-21 MED ORDER — CHLORHEXIDINE GLUCONATE 0.12 % MT SOLN
OROMUCOSAL | Status: AC
  Filled 2019-09-21: qty 15

## 2019-09-21 MED ORDER — FUROSEMIDE 10 MG/ML IJ SOLN
80.0000 mg | Freq: Two times a day (BID) | INTRAMUSCULAR | Status: DC
  Administered 2019-09-21: 80 mg via INTRAVENOUS
  Filled 2019-09-21: qty 8

## 2019-09-21 MED ORDER — FUROSEMIDE 10 MG/ML IJ SOLN
8.0000 mg/h | INTRAVENOUS | Status: DC
  Administered 2019-09-21 – 2019-09-22 (×2): 8 mg/h via INTRAVENOUS
  Filled 2019-09-21 (×2): qty 25
  Filled 2019-09-21: qty 21
  Filled 2019-09-21: qty 25

## 2019-09-21 MED ORDER — METOPROLOL TARTRATE 12.5 MG HALF TABLET
12.5000 mg | ORAL_TABLET | Freq: Two times a day (BID) | ORAL | Status: DC
  Administered 2019-09-21 – 2019-09-22 (×3): 12.5 mg via ORAL
  Filled 2019-09-21 (×4): qty 1

## 2019-09-21 MED ORDER — METOPROLOL TARTRATE 25 MG/10 ML ORAL SUSPENSION: 12.5000 mg | Freq: Two times a day (BID) | ORAL | Status: DC

## 2019-09-21 MED ORDER — POTASSIUM CHLORIDE CRYS ER 20 MEQ PO TBCR
40.0000 meq | EXTENDED_RELEASE_TABLET | Freq: Two times a day (BID) | ORAL | Status: AC
  Administered 2019-09-21 – 2019-09-22 (×2): 40 meq via ORAL
  Filled 2019-09-21 (×2): qty 2

## 2019-09-21 NOTE — Progress Notes (Addendum)
Advanced Heart Failure Rounding Note   Subjective:    Events: - impella placed 3/12 - Underwent CABG 3/13 (LIMA to LAD; SVG to Diag; SVG sequential to OM1 and OM2)   Extubated 3/14 ---> 3 liters.   VP 0.03, NE 9, epi 5 mcg,  milrinone 0.25, amio 30 mg   CO-OX 57%.    Swan numbers: CVP 10 PA  32/20  PCWP 22 CO 4.3 CI 2.4  SVR 1158   On Impella P-6 Flow 4.3 CPO 0.6  Waveforms ok (Personally reviewed)  Denies SOB.   Objective:   Weight Range:  Vital Signs:   Temp:  [97.9 F (36.6 C)-99.5 F (37.5 C)] 99 F (37.2 C) (03/15 0600) Pulse Rate:  [80-95] 95 (03/15 0600) Resp:  [16-40] 29 (03/15 0600) BP: (77-101)/(62-79) 101/77 (03/15 0600) SpO2:  [94 %-100 %] 100 % (03/15 0600) Arterial Line BP: (74-109)/(49-69) 102/67 (03/15 0600) FiO2 (%):  [40 %-50 %] 40 % (03/14 1726) Weight:  [82.3 kg] 82.3 kg (03/15 0554) Last BM Date: (PTA)  Weight change: Filed Weights   09/19/19 0354 09/20/19 0600 09/21/19 0554  Weight: 73.9 kg 80.4 kg 82.3 kg    Intake/Output:   Intake/Output Summary (Last 24 hours) at 09/21/2019 0715 Last data filed at 09/21/2019 0600 Gross per 24 hour  Intake 3970.64 ml  Output 3345 ml  Net 625.64 ml    CVP 10  Physical Exam: General:  No resp difficulty HEENT: ETT  Neck: supple. no JVD. Carotids 2+ bilat; no bruits. No lymphadenopathy or thryomegaly appreciated. Cor: PMI nondisplaced. Regular rate & rhythm. No rubs, gallops or murmurs. Lungs: clear  On 3 liters  Abdomen: soft, nontender, nondistended. No hepatosplenomegaly. No bruits or masses. Good bowel sounds. Extremities: no cyanosis, clubbing, rash, RLE immobilizer. R femoral CP impella  R and LLE 1-2+ edema.  Neuro: alert & orientedx3, cranial nerves grossly intact. moves all 4 extremities w/o difficulty. Affect pleasant   Telemetry: A paced 90s Personally reviewed   Labs: Basic Metabolic Panel: Recent Labs  Lab 09/18/19 2005 09/19/19 0238 09/19/19 1944 09/19/19 1953  09/20/19 0426 09/20/19 0426 09/20/19 1216 09/20/19 1614 09/20/19 1814 09/20/19 1944 09/21/19 0430  NA 134*   < > 136   < > 134*   < > 133* 132* 135 136 133*  K 4.3   < > 4.0   < > 3.7   < > 4.3 3.9 3.7 3.6 4.2  CL 102   < > 106  --  105  --  105 104  --   --  103  CO2 21*   < > 21*  --  20*  --  21* 20*  --   --  21*  GLUCOSE 130*   < > 152*  --  117*  --  126* 117*  --   --  129*  BUN 34*   < > 23  --  24*  --  22 21  --   --  22  CREATININE 1.16   < > 1.22  --  1.27*  --  1.13 1.12  --   --  1.05  CALCIUM 8.7*   < > 7.8*   < > 8.0*   < > 7.7* 7.6*  --   --  8.0*  MG 1.9  --   --   --  2.2  --   --  2.0  --   --   --    < > = values in  this interval not displayed.    Liver Function Tests: Recent Labs  Lab 09/18/19 1300 09/18/19 2005  AST 37 74*  ALT 31 41  ALKPHOS 76 71  BILITOT 1.3* 2.0*  PROT 6.7 6.2*  ALBUMIN 4.3 3.9   No results for input(s): LIPASE, AMYLASE in the last 168 hours. No results for input(s): AMMONIA in the last 168 hours.  CBC: Recent Labs  Lab 09/18/19 1300 09/18/19 1520 09/19/19 0238 09/19/19 0431 09/19/19 1944 09/19/19 1953 09/20/19 0426 09/20/19 0426 09/20/19 1614 09/20/19 1814 09/20/19 1858 09/20/19 1944 09/21/19 0430  WBC 10.7*   < > 8.9   < > 10.1  --  10.6*  --  10.7*  --  9.5  --  8.6  NEUTROABS 7.5  --  6.8  --   --   --  7.2  --   --   --   --   --  6.1  HGB 15.2   < > 13.0   < > 9.3*   < > 9.7*   < > 8.8* 7.8* 8.9* 7.5* 8.1*  HCT 46.0   < > 37.9*   < > 27.2*   < > 26.7*   < > 25.5* 23.0* 25.5* 22.0* 24.1*  MCV 93.3   < > 89.8   < > 90.7  --  89.3  --  89.5  --  89.2  --  90.9  PLT 179   < > 136*   < > 103*  --  130*  --  74*  --  66*  --  56*   < > = values in this interval not displayed.    Cardiac Enzymes: No results for input(s): CKTOTAL, CKMB, CKMBINDEX, TROPONINI in the last 168 hours.  BNP: BNP (last 3 results) Recent Labs    09/18/19 1300  BNP 1,784.5*    ProBNP (last 3 results) No results for input(s):  PROBNP in the last 8760 hours.    Other results:  Imaging: DG Chest 1 View  Result Date: 09/19/2019 CLINICAL DATA:  Status post CABG surgery. EXAM: CHEST  1 VIEW COMPARISON:  09/18/2019 FINDINGS: Since the previous day's study, CABG surgery has been performed. Standard lines and tubes are in place: An endotracheal tube with its tip projecting approximately 2 cm above the carina, a nasal/orogastric tube passing well below the diaphragm into the stomach, a right internal jugular Swan-Ganz catheter, tip projecting in the right lower lobe pulmonary artery, a left internal jugular central venous line with its tip at the left brachiocephalic/superior vena cava confluence, and bilateral chest tubes. Improved lung aeration with decreased pleural effusions and a mild decrease in interstitial thickening. There is additional opacity at the medial lung bases consistent with atelectasis. No gross pneumothorax noted on this supine study. No mediastinal widening. IMPRESSION: 1. Status post CABG surgery since the previous exam. No evidence of an operative complication. 2. Improved congestive heart failure with a decrease in interstitial edema and pleural effusions. 3. Support apparatus as detailed. Electronically Signed   By: Lajean Manes M.D.   On: 09/19/2019 14:18   DG Chest Port 1 View  Result Date: 09/20/2019 CLINICAL DATA:  Open heart surgery EXAM: PORTABLE CHEST 1 VIEW COMPARISON:  09/19/2019 FINDINGS: Endotracheal tube terminates 5 cm above the carina. Left IJ venous catheter terminates in the distal left brachiocephalic vein. Enteric tube courses into the stomach. Right IJ Swan-Ganz catheter terminates in the main pulmonary artery. Bilateral chest tubes and mediastinal drain. No pneumothorax is seen. Small  left and layering small to moderate right pleural effusions, new/increased. Cardiomegaly.  Postsurgical changes related to prior CABG. IMPRESSION: Endotracheal tube terminates 5 cm above the carina.  Bilateral chest tubes and mediastinal drain. No pneumothorax is seen. Small left and layering small to moderate right pleural effusions. Additional support apparatus as above. Electronically Signed   By: Julian Hy M.D.   On: 09/20/2019 07:40   ECHO INTRAOPERATIVE TEE  Result Date: 09/19/2019  *INTRAOPERATIVE TRANSESOPHAGEAL REPORT *  Patient Name:   Yordi Gandolfi Date of Exam: 09/19/2019 Medical Rec #:  [id number]  Height:       65.5 in Accession #:    [id number] Weight:       162.9 lb Date of Birth:  1946-02-01   BSA:          1.82 m Patient Age:    56 years   BP:           117/97 mmHg Patient Gender: M          HR:           90 bpm. Exam Location:  Inpatient Transesophogeal exam was perform intraoperatively during surgical procedure. Patient was closely monitored under general anesthesia during the entirety of examination. Indications:     CAD Performing Phys: Roberts Gaudy MD Diagnosing Phys: Roberts Gaudy MD PRE-OP FINDINGS  Left Ventricle: Examination of LV function was limited due to the presence of a functioning impella LVAD. There was severe LV dysfunction. The ejection fraction was estimated at 20%. The anterior wall and anterior septum were akinetic to dyskinetic. the  lateral and inferior basal wall were hypokinetic. On the post-bypass examination, the LV systolic function appeared improved from the pre-bypass exam. The ejection fraction was estimated at 30% with some hypocontractility noted in the mid-anterior wall. The apex and distal anterior septum remained akinetic. There was improved contractility in the remaining LV segments. Right Ventricle: The RV was normal in size with normal RV systolic function. On the post bypass examination, there was normal RV size and systolic function. Left Atrium: The left atrium was mildly enlarged and measured 4.0 cm in the medial-lateral dimension. There was no thrombus within the LA cavity or LA appendage. The LAA emptying velocities were normal by PW Doppler.   Interatrial Septum: There was a patent foramen ovale present at the superior aspect of the fossa ovalis. There was continuous left to right flow. Pericardium: There is no evidence of pericardial effusion. There is a large pleural effusion in both left and right lateral regions. Mitral Valve: The mitral leaflets were normal in thickness and coaptated normally without prolapsing, flail, or restricted segments. There was was trace to 1+ mitral insufficiency. The impella cannula was not inferring with mitral valve function. Tricuspid Valve: The tricuspid valve was normal in structure. Tricuspid valve regurgitation is trivial by color flow Doppler. Aortic Valve: The aortic valve was trileaflet. There was normal appearing leaflet thickness and leaflet mobility. There was an impella catheter crossing the aortic valve. There was trace aortic insufficiency. Upon initial examination the impella cannula appeared well positioned with the inflow port 2.84 cm from the aortic valve. The outflow port of the impella was 3 cm distal to the aortic valve. At the conclusion of the procedure, the impella inflow port was 2.55 cm proximal to the aortic valve and the ouflow port was approximately 3 cm distal to the aortic valve and there was no evidence of obstruction to either the inflow or ouflow ports. Pulmonic Valve: The pulmonic  valve was normal in structure, with normal leaflet excursion. Pulmonic valve regurgitation is mild by color flow Doppler. Aorta: The proximal ascending aorta was normal in diameter without dilatation or effacement. The ascending aorta was examined with epiaortic ultrasound prior to aortic cannulation. At the site of cannulation, the aorta measured 3.28 cm in diameter with normal wall thickness and no atherosclerotic disease appreciated. The impella cannula was visualized within the aortic lumen. The descending aorta measured 2.1 cm in diameter with no evidnce of atherosclerotic disease appreciated. The impella  cannula was seen within the lumen of descending thoracic aorta. Pulmonary Artery: The pulmonary artery is of normal size.  Roberts Gaudy MD Electronically signed by Roberts Gaudy MD Signature Date/Time: 09/19/2019/6:04:42 PM    Final    ECHOCARDIOGRAM LIMITED  Result Date: 09/20/2019    ECHOCARDIOGRAM LIMITED REPORT   Patient Name:   Ibrahim Hizer Date of Exam: 09/20/2019 Medical Rec #:  [id number]  Height:       65.5 in Accession #:    [id number] Weight:       177.2 lb Date of Birth:  [birth year]   BSA:          1.890 m Patient Age:    80 years   BP:           89/72 mmHg Patient Gender: M          HR:           88 bpm. Exam Location:  Inpatient Procedure: Limited Echo, Cardiac Doppler and Color Doppler Indications:     I50.9* Heart failure (unspecified) LVAD evaluation. Impella                  device.  History:         Patient has prior history of Echocardiogram examinations, most                  recent 09/18/2019. LV DysFx, s/p Cardiac Surgery, Cardiomyopathy                  and CHF; CAD. Impella device present. Cardiac shock. Ischemic                  cardiomyopathy. Acute heart failure.  Sonographer:     Roseanna Rainbow RDCS Referring Phys:  [id number] Z ATKINS Diagnosing Phys: Lyman Bishop MD  Sonographer Comments: No subcostal window. Dr. Julien Girt present in room. Wound dressing in subcostal and suprasternal regions. IMPRESSIONS  1. Impella adjusted with tip between 3.5 and 4.0 cm across the aortic valve - Dr. Orvan Seen present. Left ventricular ejection fraction, by estimation, is 20 to 25%. The left ventricle has severely decreased function. The left ventricle demonstrates global  hypokinesis. FINDINGS  Left Ventricle: Impella adjusted with tip between 3.5 and 4.0 cm across the aortic valve - Dr. Orvan Seen present. Left ventricular ejection fraction, by estimation, is 20 to 25%. The left ventricle has severely decreased function. The left ventricle demonstrates global hypokinesis.  LEFT VENTRICLE PLAX 2D LVIDd:          5.40 cm LVIDs:         4.80 cm LV PW:         1.00 cm LV IVS:        1.10 cm  LEFT ATRIUM         Index LA diam:    4.10 cm 2.17 cm/m   AORTA Ao Root diam: 3.00 cm Lyman Bishop MD Electronically signed by Lyman Bishop MD Signature  Date/Time: 09/20/2019/5:11:40 PM    Final      Medications:     Scheduled Medications: . acetaminophen  1,000 mg Oral Q6H   Or  . acetaminophen (TYLENOL) oral liquid 160 mg/5 mL  1,000 mg Per Tube Q6H  . aspirin EC  325 mg Oral Daily   Or  . aspirin  324 mg Per Tube Daily  . bisacodyl  10 mg Oral Daily   Or  . bisacodyl  10 mg Rectal Daily  . chlorhexidine      . chlorhexidine gluconate (MEDLINE KIT)  15 mL Mouth Rinse BID  . Chlorhexidine Gluconate Cloth  6 each Topical Daily  . digoxin  0.125 mg Oral Daily  . docusate sodium  200 mg Oral Daily  . furosemide  80 mg Intravenous Daily  . metoprolol tartrate  12.5 mg Oral BID   Or  . metoprolol tartrate  12.5 mg Oral BID  . pantoprazole  40 mg Oral Daily  . sodium chloride flush  10-40 mL Intracatheter Q12H  . sodium chloride flush  3 mL Intravenous Q12H  . sodium chloride flush  3 mL Intravenous Q12H  . sodium chloride flush  3 mL Intravenous Q12H  . sodium chloride flush  3 mL Intravenous Q12H  . spironolactone  12.5 mg Oral Daily    Infusions: . sodium chloride 20 mL/hr at 09/20/19 2108  . sodium chloride    . sodium chloride    . sodium chloride    . sodium chloride 10 mL/hr at 09/20/19 1900  . amiodarone 30 mg/hr (09/21/19 0541)  . cefUROXime (ZINACEF)  IV 200 mL/hr at 09/20/19 1900  . dexmedetomidine (PRECEDEX) IV infusion Stopped (09/20/19 1811)  . dextrose 5 % Impella 5.0 Purge solution 500 mL (09/19/19 2101)  . EPINEPHrine 4 mg in dextrose 5% 250 mL infusion (16 mcg/mL) 5 mcg/min (09/21/19 0055)  . impella catheter heparin 50 unit/mL in dextrose 5%    . insulin 1.7 mL/hr at 09/20/19 1900  . lactated ringers    . lactated ringers 20 mL/hr at 09/20/19 1900  . milrinone 0.25  mcg/kg/min (09/20/19 1900)  . nitroGLYCERIN Stopped (09/19/19 1558)  . norepinephrine (LEVOPHED) Adult infusion 15 mcg/min (09/20/19 2104)  . phenylephrine (NEO-SYNEPHRINE) Adult infusion Stopped (09/19/19 1559)  . vasopressin (PITRESSIN) infusion - *FOR SHOCK* 0.03 Units/min (09/20/19 2106)    PRN Medications: sodium chloride, sodium chloride, sodium chloride, acetaminophen, ALPRAZolam, dextrose, metoprolol tartrate, midazolam, morphine injection, ondansetron (ZOFRAN) IV, oxyCODONE, sodium chloride flush, sodium chloride flush, sodium chloride flush, sodium chloride flush, traMADol, zolpidem   Assessment/Plan:   1. Acute systolic HF ->Cardiogenic shock - 09/18/19: Echo EF 20% RV ok - 09/18/19: Cath 3v CAD with 99% LM  - On multiple inotropes - CO-OX 57%. Continue VP to 0.03, norepi 9 mcg + milrinone 0.25 mcg.  - Continue impella P-6. Waveforms ok as above Site ok. LDH 281  - Remains volume overloaded > 20 pounds above his baseline. Increase lasix to 80 mg twice a day.  - May need impella 5.5  2. 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 Acute hypoxic respiratory failure - mechanics look good post-op  - Extubated 3/14. Sats stable on 3 liters.   4. Expected post-op blood loss - hgb 8.1   5. Low-grade fever - On cefuroxime   Length of Stay: 3   Amy  Clegg NP-C  09/21/2019, 7:15 AM  Advanced Heart Failure Team Pager [phone] (M-F; [place])  Please contact [facility] for night-coverage after hours (4p -7a ) and weekends on [email or web address]  Agree with above.    Extubated last night. Respiratory status stable. Remains on Impella at P-6. Waveforms look good. Output ~3.0L  On multiple pressors. Chest sore. Denies orthopnea or PND.   General:  Lying inbed. No resp difficulty HEENT: normal Neck: supple. RIJ swan. Carotids 2+ bilat; no bruits. No lymphadenopathy or  thryomegaly appreciated. Cor:  Sternal site ok. CTsPMI nondisplaced. Regular rate & rhythm. No rubs, gallops or murmurs. Lungs: clear Abdomen: soft, nontender, +distended. No hepatosplenomegaly. No bruits or masses. Decreased bowel sounds. Extremities: no cyanosis, clubbing, rash, 1+ edema  Impella site ok  Neuro: alert & orientedx3, cranial nerves grossly intact. moves all 4 extremities w/o difficulty. Affect pleasant  Now extubated. On Impella and multiple pressors. Swan numbers ok. Will wean VP then wean NE/EPI. Start lasix gtt at 8.   CRITICAL CARE Performed by: Glori Bickers  Total critical care time: 35 minutes  Critical care time was exclusive of separately billable procedures and treating other patients.  Critical care was necessary to treat or prevent imminent or life-threatening deterioration.  Critical care was time spent personally by me (independent of midlevel providers or residents) on the following activities: development of treatment plan with patient and/or surrogate as well as nursing, discussions with consultants, evaluation of patient's response to treatment, examination of patient, obtaining history from patient or surrogate, ordering and performing treatments and interventions, ordering and review of laboratory studies, ordering and review of radiographic studies, pulse oximetry and re-evaluation of patient's condition.  Glori Bickers, MD  8:22 AM

## 2019-09-21 NOTE — Plan of Care (Signed)
Nutrition not advanced because of nausea. Antiemetic given Activity not met because of movement restriction due to the presence of femoral impella(heart pump)

## 2019-09-21 NOTE — Progress Notes (Signed)
Patient had a vagal event whiles using incentive spirometer. Maps dropped from 75 to 57 and patient had a hot flash

## 2019-09-21 NOTE — Progress Notes (Signed)
RT Note:  Patient currently on 3L [place], tolerating well at this time. No distress noted. Bipap not in use at this time.

## 2019-09-21 NOTE — Plan of Care (Signed)
  Problem: Education: Goal: Knowledge of General Education information will improve Description: Including pain rating scale, medication(s)/side effects and non-pharmacologic comfort measures 09/21/2019 2144 by Seleta Rhymes, Alinda Money, RN Outcome: Progressing 09/21/2019 2141 by Seleta Rhymes, Alinda Money, RN Outcome: Progressing   Problem: Health Behavior/Discharge Planning: Goal: Ability to manage health-related needs will improve 09/21/2019 2144 by Cecile Sheerer, RN Outcome: Progressing 09/21/2019 2141 by Seleta Rhymes, Alinda Money, RN Outcome: Progressing   Problem: Clinical Measurements: Goal: Ability to maintain clinical measurements within normal limits will improve 09/21/2019 2144 by Cecile Sheerer, RN Outcome: Progressing 09/21/2019 2141 by Seleta Rhymes, Alinda Money, RN Outcome: Progressing Goal: Will remain free from infection 09/21/2019 2144 by Cecile Sheerer, RN Outcome: Progressing 09/21/2019 2141 by Seleta Rhymes, Alinda Money, RN Outcome: Progressing Goal: Diagnostic test results will improve 09/21/2019 2144 by Cecile Sheerer, RN Outcome: Progressing 09/21/2019 2141 by Seleta Rhymes, Alinda Money, RN Outcome: Progressing Goal: Cardiovascular complication will be avoided 09/21/2019 2144 by Cecile Sheerer, RN Outcome: Progressing 09/21/2019 2141 by Seleta Rhymes, Alinda Money, RN Outcome: Progressing   Problem: Clinical Measurements: Goal: Postoperative complications will be avoided or minimized 09/21/2019 2144 by Cecile Sheerer, RN Outcome: Progressing 09/21/2019 2141 by Seleta Rhymes, Alinda Money, RN Outcome: Progressing   Problem: Respiratory: Goal: Respiratory status will improve 09/21/2019 2144 by Cecile Sheerer, RN Outcome: Progressing 09/21/2019 2141 by Seleta Rhymes, Alinda Money, RN Outcome: Progressing   Problem: Skin Integrity: Goal: Wound healing without signs and symptoms of infection 09/21/2019 2144 by Cecile Sheerer, RN Outcome: Progressing 09/21/2019 2141 by Seleta Rhymes,  Alinda Money, RN Outcome: Progressing Goal: Risk for impaired skin integrity will decrease 09/21/2019 2144 by Cecile Sheerer, RN Outcome: Progressing 09/21/2019 2141 by Seleta Rhymes, Alinda Money, RN Outcome: Progressing   Problem: Urinary Elimination: Goal: Ability to achieve and maintain adequate renal perfusion and functioning will improve 09/21/2019 2144 by Cecile Sheerer, RN Outcome: Progressing 09/21/2019 2141 by Cecile Sheerer, RN Outcome: Progressing

## 2019-09-21 NOTE — Addendum Note (Signed)
Addendum  created 09/21/19 0847 by Adair Laundry, CRNA   Order list changed

## 2019-09-21 NOTE — Plan of Care (Signed)
  Problem: Education: Goal: Knowledge of General Education information will improve Description: Including pain rating scale, medication(s)/side effects and non-pharmacologic comfort measures Outcome: Progressing   Problem: Clinical Measurements: Goal: Will remain free from infection Outcome: Progressing Goal: Respiratory complications will improve Outcome: Completed/Met Goal: Cardiovascular complication will be avoided Outcome: Progressing

## 2019-09-21 NOTE — Progress Notes (Signed)
CT surgery p.m. Rounds  Patient resting comfortably on nasal oxygen Impella setting P6, flow 2.8 L/min Blood pressure 94/71, pulse 80, temperature 99.3 F (37.4 C), resp. rate (!) 28, height 5' 5.5" (1.664 m), weight 82.3 kg, SpO2 100 %.

## 2019-09-22 ENCOUNTER — Inpatient Hospital Stay (HOSPITAL_COMMUNITY): Payer: Medicare Other

## 2019-09-22 LAB — GLUCOSE, CAPILLARY
Glucose-Capillary: 103 mg/dL — ABNORMAL HIGH (ref 70–99)
Glucose-Capillary: 104 mg/dL — ABNORMAL HIGH (ref 70–99)
Glucose-Capillary: 108 mg/dL — ABNORMAL HIGH (ref 70–99)
Glucose-Capillary: 110 mg/dL — ABNORMAL HIGH (ref 70–99)
Glucose-Capillary: 120 mg/dL — ABNORMAL HIGH (ref 70–99)
Glucose-Capillary: 126 mg/dL — ABNORMAL HIGH (ref 70–99)
Glucose-Capillary: 95 mg/dL (ref 70–99)

## 2019-09-22 LAB — BASIC METABOLIC PANEL
Anion gap: 10 (ref 5–15)
BUN: 24 mg/dL — ABNORMAL HIGH (ref 8–23)
CO2: 23 mmol/L (ref 22–32)
Calcium: 8 mg/dL — ABNORMAL LOW (ref 8.9–10.3)
Chloride: 99 mmol/L (ref 98–111)
Creatinine, Ser: 1.03 mg/dL (ref 0.61–1.24)
GFR calc Af Amer: >60 mL/min (ref >60)
GFR calc non Af Amer: >60 mL/min (ref >60)
Glucose, Bld: 113 mg/dL — ABNORMAL HIGH (ref 70–99)
Potassium: 4 mmol/L (ref 3.5–5.1)
Sodium: 132 mmol/L — ABNORMAL LOW (ref 135–145)

## 2019-09-22 LAB — HEPARIN INDUCED PLATELET AB (HIT ANTIBODY): Heparin Induced Plt Ab: 0.096 OD (ref 0.000–0.400)

## 2019-09-22 LAB — CBC WITH DIFFERENTIAL/PLATELET
Abs Immature Granulocytes: 0.04 10*3/uL (ref 0.00–0.07)
Basophils Absolute: 0 10*3/uL (ref 0.0–0.1)
Basophils Relative: 0 %
Eosinophils Absolute: 0 10*3/uL (ref 0.0–0.5)
Eosinophils Relative: 0 %
HCT: 23.8 % — ABNORMAL LOW (ref 39.0–52.0)
Hemoglobin: 7.9 g/dL — ABNORMAL LOW (ref 13.0–17.0)
Immature Granulocytes: 1 %
Lymphocytes Relative: 18 %
Lymphs Abs: 1.1 10*3/uL (ref 0.7–4.0)
MCH: 31.1 pg (ref 26.0–34.0)
MCHC: 33.2 g/dL (ref 30.0–36.0)
MCV: 93.7 fL (ref 80.0–100.0)
Monocytes Absolute: 0.5 10*3/uL (ref 0.1–1.0)
Monocytes Relative: 8 %
Neutro Abs: 4.3 10*3/uL (ref 1.7–7.7)
Neutrophils Relative %: 73 %
Platelets: 46 10*3/uL — ABNORMAL LOW (ref 150–400)
RBC: 2.54 MIL/uL — ABNORMAL LOW (ref 4.22–5.81)
RDW: 14.5 % (ref 11.5–15.5)
WBC: 5.9 10*3/uL (ref 4.0–10.5)
nRBC: 0 % (ref 0.0–0.2)

## 2019-09-22 LAB — LACTATE DEHYDROGENASE: LDH: 239 U/L — ABNORMAL HIGH (ref 98–192)

## 2019-09-22 LAB — POCT ACTIVATED CLOTTING TIME: Activated Clotting Time: 142 seconds

## 2019-09-22 LAB — COOXEMETRY PANEL
Carboxyhemoglobin: 1.3 % (ref 0.5–1.5)
Methemoglobin: 1.2 % (ref 0.0–1.5)
O2 Saturation: 57.9 %
Total hemoglobin: 8.2 g/dL — ABNORMAL LOW (ref 12.0–16.0)

## 2019-09-22 LAB — HEPARIN LEVEL (UNFRACTIONATED): Heparin Unfractionated: <0.1 IU/mL — ABNORMAL LOW (ref 0.30–0.70)

## 2019-09-22 MED ORDER — METOLAZONE 2.5 MG PO TABS
2.5000 mg | ORAL_TABLET | Freq: Every day | ORAL | Status: DC
  Administered 2019-09-22: 2.5 mg via ORAL
  Filled 2019-09-22: qty 1

## 2019-09-22 MED ORDER — INSULIN ASPART 100 UNIT/ML ~~LOC~~ SOLN: 0.0000 [IU] | SUBCUTANEOUS | Status: DC

## 2019-09-22 MED ORDER — INSULIN ASPART 100 UNIT/ML ~~LOC~~ SOLN
0.0000 [IU] | SUBCUTANEOUS | Status: DC
  Administered 2019-09-22: 2 [IU] via SUBCUTANEOUS

## 2019-09-22 MED ORDER — POTASSIUM CHLORIDE CRYS ER 20 MEQ PO TBCR
40.0000 meq | EXTENDED_RELEASE_TABLET | Freq: Three times a day (TID) | ORAL | Status: AC
  Administered 2019-09-22 (×3): 40 meq via ORAL
  Filled 2019-09-22 (×3): qty 2

## 2019-09-22 MED ORDER — COLCHICINE 0.3 MG HALF TABLET
0.3000 mg | ORAL_TABLET | Freq: Two times a day (BID) | ORAL | Status: DC
  Administered 2019-09-22 – 2019-09-30 (×17): 0.3 mg via ORAL
  Filled 2019-09-22 (×20): qty 1

## 2019-09-22 MED ORDER — LIP MEDEX EX OINT
TOPICAL_OINTMENT | CUTANEOUS | Status: DC | PRN
  Filled 2019-09-22: qty 7

## 2019-09-22 NOTE — Progress Notes (Signed)
      [address]       [place] [address]             [phone]      POD # 2 CABG, closure PDA''  Resting comfortably  BP 105/73   Pulse 80   Temp 98.8 F (37.1 C)   Resp (!) 21   Ht 5' 5.5" (1.664 m)   Wt 80.3 kg   SpO2 100%   BMI 29.01 kg/m   24/11 CI= 2.7  UO decreased with change of Impella from p6 to p3- increased back to P6  Continue current Rx  Pranay Hilbun C. Dorris Fetch, MD [facility] [phone]

## 2019-09-22 NOTE — Progress Notes (Signed)
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   Off VP and NE. On epi 0.5. Impella was at p-6 overnight. Now p-3.  Awake. Communicative. Denies SOB. Good diuresis.   CO-OX 58% on impella p-6   Swan numbers on P-6: CVP 10 PA  31/19 PCWP 18 CO 4.1 CI 2.3 SVR 1142  On Impella P-6 Flow 2.3 Waveforms ok (Personally reviewed)   Objective:   Weight Range:  Vital Signs:   Temp:  [98.4 F (36.9 C)-99.3 F (37.4 C)] 98.6 F (37 C) (03/16 0800) Pulse Rate:  [72-91] 78 (03/16 0800) Resp:  [16-35] 21 (03/16 0800) BP: (83-103)/(64-79) 92/67 (03/16 0800) SpO2:  [98 %-100 %] 100 % (03/16 0800) Arterial Line BP: (93-119)/(52-68) 103/53 (03/16 0800) Weight:  [80.3 kg] 80.3 kg (03/16 0623) Last BM Date: (pta)  Weight change: Filed Weights   09/20/19 0600 09/21/19 0554 09/22/19 0623  Weight: 80.4 kg 82.3 kg 80.3 kg    Intake/Output:   Intake/Output Summary (Last 24 hours) at 09/22/2019 0845 Last data filed at 09/22/2019 0800 Gross per 24 hour  Intake 2336.35 ml  Output 5490 ml  Net -3153.65 ml     Physical Exam: General:  Lying flat in bed NAD HEENT: normal Neck: supple. RIJ swan Carotids 2+ bilat; no bruits. No lymphadenopathy or thryomegaly appreciated. Cor: Sternal dressing ok  PMI nondisplaced. Regular rate & rhythm. No rubs, gallops or murmurs. + CTs Lungs: decreased at bases Abdomen: soft, nontender, nondistended. No hepatosplenomegaly. No bruits or masses. hypoacitve bowel sounds. Extremities: no cyanosis, clubbing, rash, 1+ edema Impella site ok  Neuro: alert & orientedx3, cranial nerves grossly intact. moves all 4 extremities w/o difficulty. Affect pleasant   Telemetry: Sinus 70s Personally reviewed   Labs: Basic Metabolic Panel: Recent Labs  Lab 09/18/19 2005 09/19/19 0238 09/20/19 0426 09/20/19  0426 09/20/19 1216 09/20/19 1216 09/20/19 1614 09/20/19 1814 09/20/19 1944 09/21/19 0430 09/22/19 0357  NA 134*   < > 134*   < > 133*   < > 132* 135 136 133* 132*  K 4.3   < > 3.7   < > 4.3   < > 3.9 3.7 3.6 4.2 4.0  CL 102   < > 105  --  105  --  104  --   --  103 99  CO2 21*   < > 20*  --  21*  --  20*  --   --  21* 23  GLUCOSE 130*   < > 117*  --  126*  --  117*  --   --  129* 113*  BUN 34*   < > 24*  --  22  --  21  --   --  22 24*  CREATININE 1.16   < > 1.27*  --  1.13  --  1.12  --   --  1.05 1.03  CALCIUM 8.7*   < > 8.0*   < > 7.7*   < > 7.6*  --   --  8.0* 8.0*  MG 1.9  --  2.2  --   --   --  2.0  --   --   --   --    < > = values in this interval not displayed.    Liver Function Tests: Recent Labs  Lab  09/18/19 1300 09/18/19 2005  AST 37 74*  ALT 31 41  ALKPHOS 76 71  BILITOT 1.3* 2.0*  PROT 6.7 6.2*  ALBUMIN 4.3 3.9   No results for input(s): LIPASE, AMYLASE in the last 168 hours. No results for input(s): AMMONIA in the last 168 hours.  CBC: Recent Labs  Lab 09/18/19 1300 09/18/19 1520 09/19/19 0238 09/19/19 0431 09/20/19 0426 09/20/19 0426 09/20/19 1614 09/20/19 1614 09/20/19 1814 09/20/19 1858 09/20/19 1944 09/21/19 0430 09/22/19 0357  WBC 10.7*   < > 8.9   < > 10.6*  --  10.7*  --   --  9.5  --  8.6 5.9  NEUTROABS 7.5  --  6.8  --  7.2  --   --   --   --   --   --  6.1 4.3  HGB 15.2   < > 13.0   < > 9.7*   < > 8.8*   < > 7.8* 8.9* 7.5* 8.1* 7.9*  HCT 46.0   < > 37.9*   < > 26.7*   < > 25.5*   < > 23.0* 25.5* 22.0* 24.1* 23.8*  MCV 93.3   < > 89.8   < > 89.3  --  89.5  --   --  89.2  --  90.9 93.7  PLT 179   < > 136*   < > 130*  --  74*  --   --  66*  --  56* 46*   < > = values in this interval not displayed.    Cardiac Enzymes: No results for input(s): CKTOTAL, CKMB, CKMBINDEX, TROPONINI in the last 168 hours.  BNP: BNP (last 3 results) Recent Labs    09/18/19 1300  BNP 1,784.5*    ProBNP (last 3 results) No results for input(s):  PROBNP in the last 8760 hours.    Other results:  Imaging: DG Chest Port 1 View  Result Date: 09/21/2019 CLINICAL DATA:  Chest tube, post CABG EXAM: PORTABLE CHEST 1 VIEW COMPARISON:  Portable exam 0516 hours compared to 09/20/2019 FINDINGS: Interval removal of nasogastric and endotracheal tubes. RIGHT jugular Swan-Ganz catheter with tip projecting over proximal descending interlobar pulmonary artery. Impella device unchanged. BILATERAL thoracostomy tubes. LEFT jugular central venous catheter with tip projecting over SVC. Enlargement of cardiac silhouette post CABG. Atherosclerotic calcification aorta. Persistent RIGHT pleural effusion and basilar atelectasis. Minimal perihilar edema. No pneumothorax or acute osseous findings. IMPRESSION: Postoperative changes, stable versus prior study. Electronically Signed   By: Mark  Boles M.D.   On: 09/21/2019 07:57   ECHOCARDIOGRAM LIMITED  Result Date: 09/20/2019    ECHOCARDIOGRAM LIMITED REPORT   Patient Name:   Dennis Roberts Date of Exam: 09/20/2019 Medical Rec #:  [id number]  Height:       65.5 in Accession #:    [id number] Weight:       177.2 lb Date of Birth:  12/27/1945   BSA:          1.890 m Patient Age:    74 years   BP:           89/72 mmHg Patient Gender: M          HR:           88 bpm. Exam Location:  Inpatient Procedure: Limited Echo, Cardiac Doppler and Color Doppler Indications:     I50.9* Heart failure (unspecified) LVAD evaluation. Impella                    device.  History:         Patient has prior history of Echocardiogram examinations, most                  recent 09/18/2019. LV DysFx, s/p Cardiac Surgery, Cardiomyopathy                  and CHF; CAD. Impella device present. Cardiac shock. Ischemic                  cardiomyopathy. Acute heart failure.  Sonographer:     Tina West RDCS Referring Phys:  [id number] BROADUS Z ATKINS Diagnosing Phys: Kenneth Hilty MD  Sonographer Comments: No subcostal window. Dr. Adkins present in room. Wound dressing in  subcostal and suprasternal regions. IMPRESSIONS  1. Impella adjusted with tip between 3.5 and 4.0 cm across the aortic valve - Dr. Atkins present. Left ventricular ejection fraction, by estimation, is 20 to 25%. The left ventricle has severely decreased function. The left ventricle demonstrates global  hypokinesis. FINDINGS  Left Ventricle: Impella adjusted with tip between 3.5 and 4.0 cm across the aortic valve - Dr. Atkins present. Left ventricular ejection fraction, by estimation, is 20 to 25%. The left ventricle has severely decreased function. The left ventricle demonstrates global hypokinesis.  LEFT VENTRICLE PLAX 2D LVIDd:         5.40 cm LVIDs:         4.80 cm LV PW:         1.00 cm LV IVS:        1.10 cm  LEFT ATRIUM         Index LA diam:    4.10 cm 2.17 cm/m   AORTA Ao Root diam: 3.00 cm Kenneth Hilty MD Electronically signed by Kenneth Hilty MD Signature Date/Time: 09/20/2019/5:11:40 PM    Final      Medications:     Scheduled Medications: . acetaminophen  1,000 mg Oral Q6H   Or  . acetaminophen (TYLENOL) oral liquid 160 mg/5 mL  1,000 mg Per Tube Q6H  . aspirin EC  325 mg Oral Daily   Or  . aspirin  324 mg Per Tube Daily  . bisacodyl  10 mg Oral Daily   Or  . bisacodyl  10 mg Rectal Daily  . chlorhexidine gluconate (MEDLINE KIT)  15 mL Mouth Rinse BID  . Chlorhexidine Gluconate Cloth  6 each Topical Daily  . colchicine  0.3 mg Oral BID  . digoxin  0.125 mg Oral Daily  . docusate sodium  200 mg Oral Daily  . insulin aspart  0-24 Units Subcutaneous Q4H  . metolazone  2.5 mg Oral Daily  . metoprolol tartrate  12.5 mg Oral BID   Or  . metoprolol tartrate  12.5 mg Oral BID  . pantoprazole  40 mg Oral Daily  . potassium chloride  40 mEq Oral TID  . sodium chloride flush  10-40 mL Intracatheter Q12H  . sodium chloride flush  3 mL Intravenous Q12H  . sodium chloride flush  3 mL Intravenous Q12H  . sodium chloride flush  3 mL Intravenous Q12H  . sodium chloride flush  3 mL  Intravenous Q12H  . spironolactone  12.5 mg Oral Daily    Infusions: . sodium chloride 10 mL/hr at 09/22/19 0800  . sodium chloride    . sodium chloride    . sodium chloride    . sodium chloride 10 mL/hr at 09/20/19 1900  . amiodarone 30 mg/hr (09/22/19 0800)  .   dextrose 5 % Impella 5.0 Purge solution 500 mL (09/19/19 2101)  . EPINEPHrine 4 mg in dextrose 5% 250 mL infusion (16 mcg/mL) 0.5 mcg/min (09/22/19 0800)  . furosemide (LASIX) infusion 8 mg/hr (09/22/19 0800)  . impella catheter heparin 50 unit/mL in dextrose 5%    . lactated ringers 20 mL/hr at 09/22/19 0424  . lactated ringers 10 mL/hr at 09/22/19 0800  . milrinone 0.25 mcg/kg/min (09/22/19 0800)  . nitroGLYCERIN Stopped (09/19/19 1558)  . norepinephrine (LEVOPHED) Adult infusion Stopped (09/22/19 0623)  . phenylephrine (NEO-SYNEPHRINE) Adult infusion Stopped (09/19/19 1559)  . vasopressin (PITRESSIN) infusion - *FOR SHOCK* Stopped (09/21/19 1009)    PRN Medications: sodium chloride, sodium chloride, sodium chloride, acetaminophen, ALPRAZolam, metoprolol tartrate, midazolam, morphine injection, ondansetron (ZOFRAN) IV, oxyCODONE, sodium chloride flush, sodium chloride flush, sodium chloride flush, sodium chloride flush, traMADol, zolpidem   Assessment/Plan:   1. Acute systolic HF ->Cardiogenic shock - 09/18/19: Echo EF 20% RV ok - 09/18/19: Cath 3v CAD with 99% LM  - CO-OX 57%. Off VP and NE. On impella p-6 and epi 0.5 - Impella weaned to p-3 today. Check co-ox - Will continue Impella today to support diuresis. Waveforms ok. LDH  - Continue Impella today to support diuresis - Continue impella P-6. Waveforms ok as above Site ok. LDH 239  - Continue lasix gtt at 8. Give dose of metolazone and supp K - Possibly remove Impella tomorrow  2. 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 Acute hypoxic  respiratory failure - Extubated 3/14. Sats stable on 3 liters.   4. Expected post-op blood loss - hgb 7.9. Transfuse as needed to keep > 7.5  5. Low-grade fever - On cefuroxime   Length of Stay: 4   Daniel Bensimhon MD 09/22/2019, 8:45 AM  Advanced Heart Failure Team Pager [phone] (M-F; 7a - 4p)  Please contact [facility] for night-coverage after hours (4p -7a ) and weekends on [email or web address]   

## 2019-09-23 LAB — CBC WITH DIFFERENTIAL/PLATELET
Abs Immature Granulocytes: 0.04 10*3/uL (ref 0.00–0.07)
Basophils Absolute: 0 10*3/uL (ref 0.0–0.1)
Basophils Relative: 0 %
Eosinophils Absolute: 0 10*3/uL (ref 0.0–0.5)
Eosinophils Relative: 0 %
HCT: 25.2 % — ABNORMAL LOW (ref 39.0–52.0)
Hemoglobin: 8.5 g/dL — ABNORMAL LOW (ref 13.0–17.0)
Immature Granulocytes: 1 %
Lymphocytes Relative: 19 %
Lymphs Abs: 1.4 10*3/uL (ref 0.7–4.0)
MCH: 30.8 pg (ref 26.0–34.0)
MCHC: 33.7 g/dL (ref 30.0–36.0)
MCV: 91.3 fL (ref 80.0–100.0)
Monocytes Absolute: 0.6 10*3/uL (ref 0.1–1.0)
Monocytes Relative: 8 %
Neutro Abs: 5.2 10*3/uL (ref 1.7–7.7)
Neutrophils Relative %: 72 %
Platelets: 48 10*3/uL — ABNORMAL LOW (ref 150–400)
RBC: 2.76 MIL/uL — ABNORMAL LOW (ref 4.22–5.81)
RDW: 13.9 % (ref 11.5–15.5)
WBC: 7.3 10*3/uL (ref 4.0–10.5)
nRBC: 0.6 % — ABNORMAL HIGH (ref 0.0–0.2)

## 2019-09-23 LAB — GLUCOSE, CAPILLARY
Glucose-Capillary: 72 mg/dL (ref 70–99)
Glucose-Capillary: 98 mg/dL (ref 70–99)
Glucose-Capillary: 117 mg/dL — ABNORMAL HIGH (ref 70–99)
Glucose-Capillary: 119 mg/dL — ABNORMAL HIGH (ref 70–99)

## 2019-09-23 LAB — BASIC METABOLIC PANEL
Anion gap: 14 (ref 5–15)
Anion gap: 10 (ref 5–15)
BUN: 24 mg/dL — ABNORMAL HIGH (ref 8–23)
BUN: 24 mg/dL — ABNORMAL HIGH (ref 8–23)
CO2: 27 mmol/L (ref 22–32)
CO2: 29 mmol/L (ref 22–32)
Calcium: 8.4 mg/dL — ABNORMAL LOW (ref 8.9–10.3)
Calcium: 8.1 mg/dL — ABNORMAL LOW (ref 8.9–10.3)
Chloride: 91 mmol/L — ABNORMAL LOW (ref 98–111)
Chloride: 87 mmol/L — ABNORMAL LOW (ref 98–111)
Creatinine, Ser: 1.17 mg/dL (ref 0.61–1.24)
Creatinine, Ser: 1.21 mg/dL (ref 0.61–1.24)
GFR calc Af Amer: >60 mL/min (ref >60)
GFR calc Af Amer: >60 mL/min (ref >60)
GFR calc non Af Amer: >60 mL/min (ref >60)
GFR calc non Af Amer: 59 mL/min — ABNORMAL LOW (ref >60)
Glucose, Bld: 108 mg/dL — ABNORMAL HIGH (ref 70–99)
Glucose, Bld: 124 mg/dL — ABNORMAL HIGH (ref 70–99)
Potassium: 3.7 mmol/L (ref 3.5–5.1)
Potassium: 3.6 mmol/L (ref 3.5–5.1)
Sodium: 132 mmol/L — ABNORMAL LOW (ref 135–145)
Sodium: 126 mmol/L — ABNORMAL LOW (ref 135–145)

## 2019-09-23 LAB — LACTATE DEHYDROGENASE: LDH: 239 U/L — ABNORMAL HIGH (ref 98–192)

## 2019-09-23 LAB — COOXEMETRY PANEL
Carboxyhemoglobin: 1.6 % — ABNORMAL HIGH (ref 0.5–1.5)
Methemoglobin: 0.6 % (ref 0.0–1.5)
O2 Saturation: 51.9 %
Total hemoglobin: 8.9 g/dL — ABNORMAL LOW (ref 12.0–16.0)

## 2019-09-23 LAB — HEPARIN LEVEL (UNFRACTIONATED): Heparin Unfractionated: <0.1 IU/mL — ABNORMAL LOW (ref 0.30–0.70)

## 2019-09-23 LAB — MAGNESIUM: Magnesium: 1.8 mg/dL (ref 1.7–2.4)

## 2019-09-23 MED ORDER — FUROSEMIDE 10 MG/ML IJ SOLN
6.0000 mg/h | INTRAVENOUS | Status: DC
  Administered 2019-09-23: 8 mg/h via INTRAVENOUS
  Filled 2019-09-23: qty 25

## 2019-09-23 MED ORDER — FUROSEMIDE 80 MG PO TABS: 80.0000 mg | ORAL_TABLET | Freq: Every day | ORAL | Status: DC

## 2019-09-23 MED ORDER — POTASSIUM CHLORIDE CRYS ER 20 MEQ PO TBCR
20.0000 meq | EXTENDED_RELEASE_TABLET | Freq: Four times a day (QID) | ORAL | Status: AC
  Administered 2019-09-23 (×2): 20 meq via ORAL
  Filled 2019-09-23 (×2): qty 1

## 2019-09-23 MED ORDER — VANCOMYCIN HCL 1250 MG/250ML IV SOLN
1250.0000 mg | Freq: Once | INTRAVENOUS | Status: AC
  Administered 2019-09-23: 1250 mg via INTRAVENOUS
  Filled 2019-09-23: qty 250

## 2019-09-23 MED ORDER — INSULIN ASPART 100 UNIT/ML ~~LOC~~ SOLN
0.0000 [IU] | SUBCUTANEOUS | Status: DC
  Administered 2019-09-25 – 2019-09-27 (×4): 2 [IU] via SUBCUTANEOUS

## 2019-09-23 MED ORDER — MAGNESIUM SULFATE 2 GM/50ML IV SOLN
2.0000 g | Freq: Once | INTRAVENOUS | Status: AC
  Administered 2019-09-23: 2 g via INTRAVENOUS
  Filled 2019-09-23: qty 50

## 2019-09-23 MED ORDER — POTASSIUM CHLORIDE CRYS ER 20 MEQ PO TBCR
40.0000 meq | EXTENDED_RELEASE_TABLET | Freq: Once | ORAL | Status: AC
  Administered 2019-09-23: 40 meq via ORAL
  Filled 2019-09-23: qty 2

## 2019-09-23 MED ORDER — VANCOMYCIN HCL 1250 MG/250ML IV SOLN
1250.0000 mg | INTRAVENOUS | Status: DC
  Administered 2019-09-24: 1250 mg via INTRAVENOUS
  Filled 2019-09-23 (×2): qty 250

## 2019-09-23 MED ORDER — POTASSIUM CHLORIDE CRYS ER 20 MEQ PO TBCR
20.0000 meq | EXTENDED_RELEASE_TABLET | Freq: Four times a day (QID) | ORAL | Status: DC
  Administered 2019-09-23 (×2): 20 meq via ORAL
  Filled 2019-09-23 (×2): qty 1

## 2019-09-23 MED ORDER — METOLAZONE 2.5 MG PO TABS
2.5000 mg | ORAL_TABLET | Freq: Once | ORAL | Status: AC
  Administered 2019-09-23: 2.5 mg via ORAL
  Filled 2019-09-23: qty 1

## 2019-09-23 NOTE — Progress Notes (Signed)
4 Days Post-Op Procedure(s) (LRB): CORONARY ARTERY BYPASS GRAFTING (CABG) using LIMA to LAD; Endoscopic harvest right saphenous vein: SVG tp Diag; SVG sequential to OM1 and OM2. (N/A) TRANSESOPHAGEAL ECHOCARDIOGRAM (TEE) (N/A) Endovein Harvest Of Greater Saphenous Vein (Right) CLOSURE OF PATENT DUCTUS ARTERIOSUS (N/A) Subjective: No complaints  Objective: Vital signs in last 24 hours: Temp:  [98.1 F (36.7 C)-99.3 F (37.4 C)] 99.3 F (37.4 C) (03/17 1700) Pulse Rate:  [71-87] 83 (03/17 1700) Cardiac Rhythm: Normal sinus rhythm (03/17 0751) Resp:  [13-35] 26 (03/17 1700) BP: (86-112)/(61-77) 112/70 (03/17 1700) SpO2:  [87 %-100 %] 94 % (03/17 1700) Arterial Line BP: (99-127)/(47-61) 119/54 (03/17 1700) Weight:  [75.4 kg] 75.4 kg (03/17 0600)  Hemodynamic parameters for last 24 hours: PAP: (20-38)/(5-17) 23/8 CVP:  [0 mmHg-9 mmHg] 0 mmHg PCWP:  [13 mmHg-15 mmHg] 15 mmHg CO:  [4.7 L/min-5.8 L/min] 5.6 L/min CI:  [2.5 L/min/m2-3.3 L/min/m2] 3.2 L/min/m2  Intake/Output from previous day: 03/16 0701 - 03/17 0700 In: 1609.6 [I.V.:1268.7] Out: 6335 [Urine:6145; Chest Tube:190] Intake/Output this shift: Total I/O In: 1097.6 [P.O.:400; I.V.:518.9; Other:128.6; IV Piggyback:50.1] Out: 2320 [Urine:2320]  General appearance: alert and cooperative Neurologic: intact Heart: regular rate and rhythm, S1, S2 normal, no murmur, click, rub or gallop Lungs: clear to auscultation bilaterally Abdomen: soft, non-tender; bowel sounds normal; no masses,  no organomegaly Extremities: edema 2+ Wound: dressed, dry  Lab Results: Recent Labs    09/22/19 0357 09/23/19 0437  WBC 5.9 7.3  HGB 7.9* 8.5*  HCT 23.8* 25.2*  PLT 46* 48*   BMET:  Recent Labs    09/23/19 0437 09/23/19 1455  NA 132* 126*  K 3.7 3.6  CL 91* 87*  CO2 27 29  GLUCOSE 108* 124*  BUN 24* 24*  CREATININE 1.17 1.21  CALCIUM 8.4* 8.1*    PT/INR: No results for input(s): LABPROT, INR in the last 72 hours. ABG     Component Value Date/Time   PHART 7.469 (H) 09/20/2019 1944   HCO3 23.2 09/20/2019 1944   TCO2 24 09/20/2019 1944   ACIDBASEDEF 2.0 09/20/2019 1814   O2SAT 51.9 09/23/2019 0437   CBG (last 3)  Recent Labs    09/23/19 0830 09/23/19 1215 09/23/19 1723  GLUCAP 98 117* 119*    Assessment/Plan: S/P Procedure(s) (LRB): CORONARY ARTERY BYPASS GRAFTING (CABG) using LIMA to LAD; Endoscopic harvest right saphenous vein: SVG tp Diag; SVG sequential to OM1 and OM2. (N/A) TRANSESOPHAGEAL ECHOCARDIOGRAM (TEE) (N/A) Endovein Harvest Of Greater Saphenous Vein (Right) CLOSURE OF PATENT DUCTUS ARTERIOSUS (N/A) Diuresis wean Impella in preparation for explant as tolerated   LOS: 5 days    Dennis Roberts 09/23/2019

## 2019-09-23 NOTE — Progress Notes (Signed)
Pharmacy Antibiotic Note  Dennis Roberts is a 74 y.o. male admitted on 09/18/2019 cardiogenic shock impella CP placed> weaning, s/p CABG 3/13 WBC wnl Cr 1.2 Temp inc 99.9  - will start empiric abx for high risk of infection. Pharmacy has been consulted for vancomycin dosing.  Plan: Vancomycin 1250mg  q24h Est AUC 533  Height: 5' 5.5" (166.4 cm) Weight: 166 lb 3.6 oz (75.4 kg) IBW/kg (Calculated) : 62.65  Temp (24hrs), Avg:98.9 F (37.2 C), Min:98.1 F (36.7 C), Max:99.5 F (37.5 C)  Recent Labs  Lab 09/18/19 2005 09/19/19 0238 09/19/19 0453 09/19/19 0908 09/20/19 1614 09/20/19 1858 09/21/19 0430 09/22/19 0357 09/23/19 0437 09/23/19 1455  WBC 8.2   < >  --    < > 10.7* 9.5 8.6 5.9 7.3  --   CREATININE 1.16   < >  --    < > 1.12  --  1.05 1.03 1.17 1.21  LATICACIDVEN 1.2  --  1.0  --   --   --   --   --   --   --    < > = values in this interval not displayed.    Estimated Creatinine Clearance: 52.1 mL/min (by C-G formula based on SCr of 1.21 mg/dL).    Allergies  Allergen Reactions  . Penicillins   . Sulfa Antibiotics     Antimicrobials this admission:   Dose adjustments this admission:   Microbiology results:   09/25/19 Pharm.D. CPP, BCPS Clinical Pharmacist [phone] 09/23/2019 8:54 PM

## 2019-09-23 NOTE — Progress Notes (Addendum)
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   Yesterday impella weaned to P3 but had decreased urine output so impella was increased to P6.  Overnight norepi restarted.   Currently on norepi 2 mcg, milrinone 0.25 mcg, lasix drip 8 mg per hour. CO-OX 52%.   Brisk diuresis noted with addition of metolazone. Negative 4.7 liters.    Swan numbers on P-6: CVP 6  PA  31/9 (15)  PCWP 9 CO 4.8  CI 2.53  SVR 1016  On Impella P-6 Flow 2.8 liters CPO 0.8  Waveforms ok (Personally reviewed)  Denies SOB.    Objective:   Weight Range:  Vital Signs:   Temp:  [98.1 F (36.7 C)-99.3 F (37.4 C)] 98.1 F (36.7 C) (03/17 0700) Pulse Rate:  [71-87] 76 (03/17 0700) Resp:  [13-37] 28 (03/17 0700) BP: (86-110)/(55-77) 98/68 (03/17 0700) SpO2:  [87 %-100 %] 92 % (03/17 0700) Arterial Line BP: (99-125)/(50-61) 107/52 (03/17 0700) Weight:  [75.4 kg] 75.4 kg (03/17 0600) Last BM Date: (pta)  Weight change: Filed Weights   09/21/19 0554 09/22/19 0623 09/23/19 0600  Weight: 82.3 kg 80.3 kg 75.4 kg    Intake/Output:   Intake/Output Summary (Last 24 hours) at 09/23/2019 0735 Last data filed at 09/23/2019 0708 Gross per 24 hour  Intake 1610.03 ml  Output 6335 ml  Net -4724.97 ml    CVP 5-6  Physical Exam: General:  No resp difficulty HEENT: normal Neck: supple. no JVD. Carotids 2+ bilat; no bruits. No lymphadenopathy or thryomegaly appreciated. RIIJ swan  Cor: PMI nondisplaced. Regular rate & rhythm. No rubs, gallops or murmurs. CTs  Lungs: clear on room air.  Abdomen: soft, nontender, nondistended. No hepatosplenomegaly. No bruits or masses. Good bowel sounds. Extremities: no cyanosis, clubbing, rash, trace lower extremity edema.  R femoral impella Neuro: alert & orientedx3, cranial nerves grossly intact. moves all 4  extremities w/o difficulty. Affect pleasant    Telemetry: SR 70-80s personally reviewed.   Labs: Basic Metabolic Panel: Recent Labs  Lab 09/18/19 2005 09/19/19 0238 09/20/19 0426 09/20/19 0426 09/20/19 1216 09/20/19 1216 09/20/19 1614 09/20/19 1614 09/20/19 1814 09/20/19 1944 09/21/19 0430 09/22/19 0357 09/23/19 0437  NA 134*   < > 134*   < > 133*   < > 132*   < > 135 136 133* 132* 132*  K 4.3   < > 3.7   < > 4.3   < > 3.9   < > 3.7 3.6 4.2 4.0 3.7  CL 102   < > 105   < > 105  --  104  --   --   --  103 99 91*  CO2 21*   < > 20*   < > 21*  --  20*  --   --   --  21* 23 27  GLUCOSE 130*   < > 117*   < > 126*  --  117*  --   --   --  129* 113* 108*  BUN 34*   < > 24*   < > 22  --  21  --   --   --  22 24* 24*  CREATININE 1.16   < > 1.27*   < > 1.13  --  1.12  --   --   --  1.05 1.03 1.17  CALCIUM 8.7*   < > 8.0*   < > 7.7*   < > 7.6*   < >  --   --  8.0* 8.0* 8.4*  MG 1.9  --  2.2  --   --   --  2.0  --   --   --   --   --  1.8   < > = values in this interval not displayed.    Liver Function Tests: Recent Labs  Lab 09/18/19 1300 09/18/19 2005  AST 37 74*  ALT 31 41  ALKPHOS 76 71  BILITOT 1.3* 2.0*  PROT 6.7 6.2*  ALBUMIN 4.3 3.9   No results for input(s): LIPASE, AMYLASE in the last 168 hours. No results for input(s): AMMONIA in the last 168 hours.  CBC: Recent Labs  Lab 09/19/19 0238 09/19/19 0431 09/20/19 0426 09/20/19 0426 09/20/19 1614 09/20/19 1814 09/20/19 1858 09/20/19 1944 09/21/19 0430 09/22/19 0357 09/23/19 0437  WBC 8.9   < > 10.6*   < > 10.7*  --  9.5  --  8.6 5.9 7.3  NEUTROABS 6.8  --  7.2  --   --   --   --   --  6.1 4.3 5.2  HGB 13.0   < > 9.7*   < > 8.8*   < > 8.9* 7.5* 8.1* 7.9* 8.5*  HCT 37.9*   < > 26.7*   < > 25.5*   < > 25.5* 22.0* 24.1* 23.8* 25.2*  MCV 89.8   < > 89.3   < > 89.5  --  89.2  --  90.9 93.7 91.3  PLT 136*   < > 130*   < > 74*  --  66*  --  56* 46* 48*   < > = values in this interval not displayed.    Cardiac  Enzymes: No results for input(s): CKTOTAL, CKMB, CKMBINDEX, TROPONINI in the last 168 hours.  BNP: BNP (last 3 results) Recent Labs    09/18/19 1300  BNP 1,784.5*    ProBNP (last 3 results) No results for input(s): PROBNP in the last 8760 hours.    Other results:  Imaging: DG Chest Port 1 View  Result Date: 09/22/2019 CLINICAL DATA:  Prior CABG. EXAM: PORTABLE CHEST 1 VIEW COMPARISON:  09/21/2019 FINDINGS: Support devices including bilateral chest tubes remain in place, unchanged. Heart is borderline in size. Improved aeration in the lung bases. Small layering right pleural effusion. Bibasilar atelectasis. No overt edema. Mild vascular congestion. IMPRESSION: Improving aeration in the lung bases. Mild residual vascular congestion, right effusion and bibasilar atelectasis. Electronically Signed   By: Rolm Baptise M.D.   On: 09/22/2019 09:54     Medications:     Scheduled Medications: . acetaminophen  1,000 mg Oral Q6H   Or  . acetaminophen (TYLENOL) oral liquid 160 mg/5 mL  1,000 mg Per Tube Q6H  . aspirin EC  325 mg Oral Daily   Or  . aspirin  324 mg Per Tube Daily  . bisacodyl  10 mg Oral Daily   Or  . bisacodyl  10 mg Rectal Daily  . chlorhexidine gluconate (MEDLINE KIT)  15 mL Mouth Rinse BID  . Chlorhexidine Gluconate Cloth  6 each Topical Daily  . colchicine  0.3 mg Oral BID  . digoxin  0.125 mg Oral Daily  . docusate sodium  200 mg Oral Daily  . insulin aspart  0-24 Units Subcutaneous Q4H  . metolazone  2.5 mg  Oral Daily  . metoprolol tartrate  12.5 mg Oral BID   Or  . metoprolol tartrate  12.5 mg Oral BID  . pantoprazole  40 mg Oral Daily  . potassium chloride  20 mEq Oral Q6H  . sodium chloride flush  10-40 mL Intracatheter Q12H  . sodium chloride flush  3 mL Intravenous Q12H  . sodium chloride flush  3 mL Intravenous Q12H  . sodium chloride flush  3 mL Intravenous Q12H  . sodium chloride flush  3 mL Intravenous Q12H  . spironolactone  12.5 mg Oral Daily     Infusions: . sodium chloride 10 mL/hr at 09/22/19 1900  . sodium chloride    . sodium chloride    . sodium chloride    . sodium chloride 10 mL/hr at 09/20/19 1900  . amiodarone 30 mg/hr (09/23/19 0202)  . dextrose 5 % Impella 5.0 Purge solution 500 mL (09/19/19 2101)  . EPINEPHrine 4 mg in dextrose 5% 250 mL infusion (16 mcg/mL) Stopped (09/22/19 1613)  . furosemide (LASIX) infusion 8 mg/hr (09/22/19 1900)  . impella catheter heparin 50 unit/mL in dextrose 5%    . lactated ringers 20 mL/hr at 09/22/19 0424  . lactated ringers 10 mL/hr at 09/22/19 1900  . milrinone 0.25 mcg/kg/min (09/22/19 2207)  . nitroGLYCERIN Stopped (09/19/19 1558)  . norepinephrine (LEVOPHED) Adult infusion 2 mcg/min (09/22/19 2317)  . phenylephrine (NEO-SYNEPHRINE) Adult infusion Stopped (09/19/19 1559)  . vasopressin (PITRESSIN) infusion - *FOR SHOCK* Stopped (09/21/19 1009)    PRN Medications: sodium chloride, sodium chloride, sodium chloride, acetaminophen, ALPRAZolam, lip balm, metoprolol tartrate, midazolam, morphine injection, ondansetron (ZOFRAN) IV, oxyCODONE, sodium chloride flush, sodium chloride flush, sodium chloride flush, sodium chloride flush, traMADol, zolpidem   Assessment/Plan:   1. Acute systolic HF ->Cardiogenic shock - 09/18/19: Echo EF 20% RV ok - 09/18/19: Cath 3v CAD with 99% LM  -Impellat at P6. Hemodynamics improved. Waveforms ok. LDH stable. -->> Start to wean per Dr Orvan Seen to P4.  - Cardiac Output 4.8  - CVP down to 5-6. Brisk diuresis noted. Stop lasix drip and metolazone. Start po lasix 80 mg daily.  - Stop metoprolol.  -Continue norepi and increase as needed with impella wean. Continue milrinone 0.25 mcg. Continue digoxin 0.125 mg daily.  - Continue 12.5 mg spironolactone daily.  - Renal function stable.   2. 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 Acute  hypoxic respiratory failure - Extubated 3/14. Sats stable on room air.    4. Expected post-op blood loss - hgb 8.5 . Transfuse as needed to keep > 7.5  5. Low-grade fever -99.3. WBC ok.  -Completed antibiotic course.   Will discuss impella wean with  Dr Orvan Seen. Possible impella removal later today.   Length of Stay: 5   Amy Clegg NP-C  09/23/2019, 7:35 AM  Advanced Heart Failure Team Pager [phone] (M-F; 7a - 4p)  Please contact [facility] for night-coverage after hours (4p -7a ) and weekends on [email or web address]  Agree with above.   Remains on Impella at P-6 overnight and now P-4. Good diuresis but weight still up 10 pounds. Swan numbers improved. Remains on NE and milrinone. Rhythm stable. Creatinine stable. Low-grade temps.   General:  Lying in bed. No resp difficulty HEENT: normal Neck: supple. JVP 8-9 + swan   Carotids 2+ bilat; no bruits. No lymphadenopathy or thryomegaly  appreciated. Cor: Sternal dressing ok PMI nondisplaced. Regular rate & rhythm. No rubs, gallops or murmurs. CTs ok Lungs: coarse  Abdomen: soft, nontender, nondistended. No hepatosplenomegaly. No bruits or masses. Good bowel sounds. Extremities: no cyanosis, clubbing, rash, 1+ edema  Impella site ok Neuro: alert & orientedx3, cranial nerves grossly intact. moves all 4 extremities w/o difficulty. Affect pleasant  Remains tenuous but improving. Will continue impella support until we can diurese him further. Continue lasix gtt and give metolazone. If we can get him close to baseline weight in am and not requiring significant inotrope support then will pull Impella in am.   Would keep on vanc while impella in place.   VAD interrogated personally. Parameters stable.  CRITICAL CARE Performed by: Glori Bickers  Total critical care time: 45 minutes  Critical care time was exclusive of separately billable procedures and treating other patients.  Critical care was necessary to treat or prevent imminent or  life-threatening deterioration.  Critical care was time spent personally by me (independent of midlevel providers or residents) on the following activities: development of treatment plan with patient and/or surrogate as well as nursing, discussions with consultants, evaluation of patient's response to treatment, examination of patient, obtaining history from patient or surrogate, ordering and performing treatments and interventions, ordering and review of laboratory studies, ordering and review of radiographic studies, pulse oximetry and re-evaluation of patient's condition.  Glori Bickers, MD  8:33 PM

## 2019-09-23 NOTE — Progress Notes (Signed)
Patient ID: Dennis Roberts, male   DOB: 1946-02-13, 74 y.o.   MRN: [id number] TCTS Evening Rounds:  Hemodynamically stable on milrinone 0.25, NE 6. CI 2.9. PA 20/7  Impella at P5 with flow 2.5 L/min.  Excellent diuresis on lasix drip. BMET    Component Value Date/Time   NA 126 (L) 09/23/2019 1455   K 3.6 09/23/2019 1455   CL 87 (L) 09/23/2019 1455   CO2 29 09/23/2019 1455   GLUCOSE 124 (H) 09/23/2019 1455   BUN 24 (H) 09/23/2019 1455   CREATININE 1.21 09/23/2019 1455   CALCIUM 8.1 (L) 09/23/2019 1455   GFRNONAA 59 (L) 09/23/2019 1455   GFRAA >60 09/23/2019 1455   Awake and alert. Anxious to get out of bed.  Hopefully can remove Impella tomorrow.

## 2019-09-24 ENCOUNTER — Inpatient Hospital Stay (HOSPITAL_COMMUNITY): Payer: Medicare Other

## 2019-09-24 LAB — CBC
HCT: 28.4 % — ABNORMAL LOW (ref 39.0–52.0)
Hemoglobin: 9.6 g/dL — ABNORMAL LOW (ref 13.0–17.0)
MCH: 30.2 pg (ref 26.0–34.0)
MCHC: 33.8 g/dL (ref 30.0–36.0)
MCV: 89.3 fL (ref 80.0–100.0)
Platelets: 67 10*3/uL — ABNORMAL LOW (ref 150–400)
RBC: 3.18 MIL/uL — ABNORMAL LOW (ref 4.22–5.81)
RDW: 13.4 % (ref 11.5–15.5)
WBC: 5.9 10*3/uL (ref 4.0–10.5)
nRBC: 0.5 % — ABNORMAL HIGH (ref 0.0–0.2)

## 2019-09-24 LAB — TYPE AND SCREEN
ABO/RH(D): A POS
Antibody Screen: NEGATIVE

## 2019-09-24 LAB — CBC WITH DIFFERENTIAL/PLATELET
Abs Immature Granulocytes: 0.07 10*3/uL (ref 0.00–0.07)
Basophils Absolute: 0 10*3/uL (ref 0.0–0.1)
Basophils Relative: 0 %
Eosinophils Absolute: 0.1 10*3/uL (ref 0.0–0.5)
Eosinophils Relative: 1 %
HCT: 29.7 % — ABNORMAL LOW (ref 39.0–52.0)
Hemoglobin: 10 g/dL — ABNORMAL LOW (ref 13.0–17.0)
Immature Granulocytes: 1 %
Lymphocytes Relative: 19 %
Lymphs Abs: 1.5 10*3/uL (ref 0.7–4.0)
MCH: 30.6 pg (ref 26.0–34.0)
MCHC: 33.7 g/dL (ref 30.0–36.0)
MCV: 90.8 fL (ref 80.0–100.0)
Monocytes Absolute: 0.8 10*3/uL (ref 0.1–1.0)
Monocytes Relative: 10 %
Neutro Abs: 5.6 10*3/uL (ref 1.7–7.7)
Neutrophils Relative %: 69 %
Platelets: 63 10*3/uL — ABNORMAL LOW (ref 150–400)
RBC: 3.27 MIL/uL — ABNORMAL LOW (ref 4.22–5.81)
RDW: 13.5 % (ref 11.5–15.5)
WBC: 8 10*3/uL (ref 4.0–10.5)
nRBC: 0.9 % — ABNORMAL HIGH (ref 0.0–0.2)

## 2019-09-24 LAB — BASIC METABOLIC PANEL
Anion gap: 13 (ref 5–15)
BUN: 21 mg/dL (ref 8–23)
CO2: 30 mmol/L (ref 22–32)
Calcium: 8.8 mg/dL — ABNORMAL LOW (ref 8.9–10.3)
Chloride: 87 mmol/L — ABNORMAL LOW (ref 98–111)
Creatinine, Ser: 1.25 mg/dL — ABNORMAL HIGH (ref 0.61–1.24)
GFR calc Af Amer: >60 mL/min (ref >60)
GFR calc non Af Amer: 57 mL/min — ABNORMAL LOW (ref >60)
Glucose, Bld: 116 mg/dL — ABNORMAL HIGH (ref 70–99)
Potassium: 3.9 mmol/L (ref 3.5–5.1)
Sodium: 130 mmol/L — ABNORMAL LOW (ref 135–145)

## 2019-09-24 LAB — COOXEMETRY PANEL
Carboxyhemoglobin: 2.2 % — ABNORMAL HIGH (ref 0.5–1.5)
Methemoglobin: 1.1 % (ref 0.0–1.5)
O2 Saturation: 58.3 %
Total hemoglobin: 10.2 g/dL — ABNORMAL LOW (ref 12.0–16.0)

## 2019-09-24 LAB — LACTATE DEHYDROGENASE: LDH: 243 U/L — ABNORMAL HIGH (ref 98–192)

## 2019-09-24 LAB — GLUCOSE, CAPILLARY
Glucose-Capillary: 122 mg/dL — ABNORMAL HIGH (ref 70–99)
Glucose-Capillary: 120 mg/dL — ABNORMAL HIGH (ref 70–99)
Glucose-Capillary: 113 mg/dL — ABNORMAL HIGH (ref 70–99)
Glucose-Capillary: 113 mg/dL — ABNORMAL HIGH (ref 70–99)

## 2019-09-24 LAB — SEROTONIN RELEASE ASSAY (SRA)
SRA .2 IU/mL UFH Ser-aCnc: <1 % (ref 0–20)
SRA 100IU/mL UFH Ser-aCnc: <1 % (ref 0–20)

## 2019-09-24 LAB — DIGOXIN LEVEL: Digoxin Level: 0.4 ng/mL — ABNORMAL LOW (ref 0.8–2.0)

## 2019-09-24 LAB — MAGNESIUM: Magnesium: 1.9 mg/dL (ref 1.7–2.4)

## 2019-09-24 LAB — POCT ACTIVATED CLOTTING TIME: Activated Clotting Time: 142 seconds

## 2019-09-24 LAB — HEPARIN LEVEL (UNFRACTIONATED): Heparin Unfractionated: <0.1 IU/mL — ABNORMAL LOW (ref 0.30–0.70)

## 2019-09-24 MED ORDER — SODIUM CHLORIDE 0.9 % IV BOLUS
250.0000 mL | Freq: Once | INTRAVENOUS | Status: AC
  Administered 2019-09-24: 250 mL via INTRAVENOUS

## 2019-09-24 MED FILL — Calcium Chloride Inj 10%: INTRAVENOUS | Qty: 10 | Status: AC

## 2019-09-24 MED FILL — Heparin Sodium (Porcine) Inj 1000 Unit/ML: INTRAMUSCULAR | Qty: 10 | Status: AC

## 2019-09-24 MED FILL — Heparin Sodium (Porcine) Inj 1000 Unit/ML: INTRAMUSCULAR | Qty: 30 | Status: AC

## 2019-09-24 MED FILL — Lidocaine HCl Local Preservative Free (PF) Inj 2%: INTRAMUSCULAR | Qty: 15 | Status: AC

## 2019-09-24 MED FILL — Lidocaine HCl Local Soln Prefilled Syringe 100 MG/5ML (2%): INTRAMUSCULAR | Qty: 5 | Status: AC

## 2019-09-24 MED FILL — Potassium Chloride Inj 2 mEq/ML: INTRAVENOUS | Qty: 40 | Status: AC

## 2019-09-24 MED FILL — Mannitol IV Soln 20%: INTRAVENOUS | Qty: 500 | Status: AC

## 2019-09-24 MED FILL — Sodium Bicarbonate IV Soln 8.4%: INTRAVENOUS | Qty: 50 | Status: AC

## 2019-09-24 MED FILL — Electrolyte-R (PH 7.4) Solution: INTRAVENOUS | Qty: 4000 | Status: AC

## 2019-09-24 NOTE — Progress Notes (Addendum)
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   Yesterday diuresed with IV lasix + metolazone. Brisk diuresis noted.   Currently on norepi 5 mcg, milrinone 0.25 mcg, lasix drip 8 mg per hour. CO-OX 58%.    Swan numbers on P-5  CVP 3  PA  25/12  CO 4.1  CI 2.3  SVR 1170   On Impella P-5 Flow 2.8 liters CPO 0.7  Waveforms ok (Personally reviewed)     Objective:   Weight Range:  Vital Signs:   Temp:  [98.1 F (36.7 C)-99.5 F (37.5 C)] 98.1 F (36.7 C) (03/18 0700) Pulse Rate:  [72-85] 80 (03/18 0700) Resp:  [16-50] 29 (03/18 0700) BP: (93-112)/(64-83) 105/76 (03/18 0700) SpO2:  [91 %-98 %] 97 % (03/18 0700) Arterial Line BP: (102-144)/(43-73) 144/72 (03/18 0700) Weight:  [70.8 kg-73.2 kg] 70.8 kg (03/18 0630) Last BM Date: (pta)  Weight change: Filed Weights   09/23/19 0600 09/23/19 2107 09/24/19 0630  Weight: 75.4 kg 73.2 kg 70.8 kg    Intake/Output:   Intake/Output Summary (Last 24 hours) at 09/24/2019 0745 Last data filed at 09/24/2019 0700 Gross per 24 hour  Intake 2249.87 ml  Output 6300 ml  Net -4050.13 ml    CVP 3  Physical Exam: General:  No resp difficulty HEENT: normal Neck: supple. no JVD. Carotids 2+ bilat; no bruits. No lymphadenopathy or thryomegaly appreciated.LIJ/RIJ   Cor: PMI nondisplaced. Regular rate & rhythm. No rubs, gallops or murmurs.Sternal incision approximated.  Lungs: clear Abdomen: soft, nontender, nondistended. No hepatosplenomegaly. No bruits or masses. Good bowel sounds. Extremities: no cyanosis, clubbing, rash, edema. R femoral impella  Neuro: alert & orientedx3, cranial nerves grossly intact. moves all 4 extremities w/o difficulty. Affect pleasant  Telemetry: SR 70-80s personally reviewed.  Labs: Basic Metabolic Panel: Recent Labs  Lab 09/18/19 2005  09/19/19 0238 09/20/19 0426 09/20/19 1216 09/20/19 1614 09/20/19 1814 09/21/19 0430 09/21/19 0430 09/22/19 0357 09/22/19 0357 09/23/19 0437 09/23/19 1455 09/24/19 0354 09/24/19 0620  NA 134*   < > 134*   < > 132*   < > 133*  --  132*  --  132* 126*  --  130*  K 4.3   < > 3.7   < > 3.9   < > 4.2  --  4.0  --  3.7 3.6  --  3.9  CL 102   < > 105   < > 104   < > 103  --  99  --  91* 87*  --  87*  CO2 21*   < > 20*   < > 20*   < > 21*  --  23  --  27 29  --  30  GLUCOSE 130*   < > 117*   < > 117*   < > 129*  --  113*  --  108* 124*  --  116*  BUN 34*   < > 24*   < > 21   < > 22  --  24*  --  24* 24*  --  21  CREATININE 1.16   < > 1.27*   < > 1.12   < > 1.05  --  1.03  --  1.17 1.21  --  1.25*  CALCIUM 8.7*   < > 8.0*   < >  7.6*   < > 8.0*   < > 8.0*   < > 8.4* 8.1*  --  8.8*  MG 1.9  --  2.2  --  2.0  --   --   --   --   --  1.8  --  1.9  --    < > = values in this interval not displayed.    Liver Function Tests: Recent Labs  Lab 09/18/19 1300 09/18/19 2005  AST 37 74*  ALT 31 41  ALKPHOS 76 71  BILITOT 1.3* 2.0*  PROT 6.7 6.2*  ALBUMIN 4.3 3.9   No results for input(s): LIPASE, AMYLASE in the last 168 hours. No results for input(s): AMMONIA in the last 168 hours.  CBC: Recent Labs  Lab 09/20/19 0426 09/20/19 1614 09/20/19 1858 09/20/19 1858 09/20/19 1944 09/21/19 0430 09/22/19 0357 09/23/19 0437 09/24/19 0354  WBC 10.6*   < > 9.5  --   --  8.6 5.9 7.3 8.0  NEUTROABS 7.2  --   --   --   --  6.1 4.3 5.2 5.6  HGB 9.7*   < > 8.9*   < > 7.5* 8.1* 7.9* 8.5* 10.0*  HCT 26.7*   < > 25.5*   < > 22.0* 24.1* 23.8* 25.2* 29.7*  MCV 89.3   < > 89.2  --   --  90.9 93.7 91.3 90.8  PLT 130*   < > 66*  --   --  56* 46* 48* 63*   < > = values in this interval not displayed.    Cardiac Enzymes: No results for input(s): CKTOTAL, CKMB, CKMBINDEX, TROPONINI in the last 168 hours.  BNP: BNP (last 3 results) Recent Labs    09/18/19 1300  BNP 1,784.5*    ProBNP (last 3  results) No results for input(s): PROBNP in the last 8760 hours.    Other results:  Imaging: DG Chest Port 1 View  Result Date: 09/22/2019 CLINICAL DATA:  Prior CABG. EXAM: PORTABLE CHEST 1 VIEW COMPARISON:  09/21/2019 FINDINGS: Support devices including bilateral chest tubes remain in place, unchanged. Heart is borderline in size. Improved aeration in the lung bases. Small layering right pleural effusion. Bibasilar atelectasis. No overt edema. Mild vascular congestion. IMPRESSION: Improving aeration in the lung bases. Mild residual vascular congestion, right effusion and bibasilar atelectasis. Electronically Signed   By: Rolm Baptise M.D.   On: 09/22/2019 09:54     Medications:     Scheduled Medications: . acetaminophen  1,000 mg Oral Q6H   Or  . acetaminophen (TYLENOL) oral liquid 160 mg/5 mL  1,000 mg Per Tube Q6H  . aspirin EC  325 mg Oral Daily   Or  . aspirin  324 mg Per Tube Daily  . bisacodyl  10 mg Oral Daily   Or  . bisacodyl  10 mg Rectal Daily  . chlorhexidine gluconate (MEDLINE KIT)  15 mL Mouth Rinse BID  . Chlorhexidine Gluconate Cloth  6 each Topical Daily  . colchicine  0.3 mg Oral BID  . digoxin  0.125 mg Oral Daily  . docusate sodium  200 mg Oral Daily  . insulin aspart  0-24 Units Subcutaneous Q4H  . pantoprazole  40 mg Oral Daily  . sodium chloride flush  10-40 mL Intracatheter Q12H  . sodium chloride flush  3 mL Intravenous Q12H  . sodium chloride flush  3 mL Intravenous Q12H  . spironolactone  12.5 mg Oral Daily    Infusions: . sodium chloride  10 mL/hr at 09/24/19 0700  . sodium chloride    . sodium chloride    . sodium chloride 10 mL/hr at 09/20/19 1900  . amiodarone 30 mg/hr (09/24/19 0700)  . dextrose 5 % Impella 5.0 Purge solution 500 mL (09/19/19 2101)  . EPINEPHrine 4 mg in dextrose 5% 250 mL infusion (16 mcg/mL) Stopped (09/22/19 1613)  . impella catheter heparin 50 unit/mL in dextrose 5%    . lactated ringers 20 mL/hr at 09/22/19 0424   . lactated ringers 10 mL/hr at 09/24/19 0700  . milrinone 0.25 mcg/kg/min (09/24/19 0700)  . nitroGLYCERIN Stopped (09/19/19 1558)  . norepinephrine (LEVOPHED) Adult infusion 5 mcg/min (09/24/19 0700)  . phenylephrine (NEO-SYNEPHRINE) Adult infusion Stopped (09/19/19 1559)  . vancomycin    . vasopressin (PITRESSIN) infusion - *FOR SHOCK* Stopped (09/21/19 1009)    PRN Medications: sodium chloride, sodium chloride, acetaminophen, ALPRAZolam, lip balm, midazolam, morphine injection, ondansetron (ZOFRAN) IV, oxyCODONE, sodium chloride flush, sodium chloride flush, sodium chloride flush, traMADol, zolpidem   Assessment/Plan:   1. Acute systolic HF ->Cardiogenic shock - 09/18/19: Echo EF 20% RV ok - 09/18/19: Cath 3v CAD with 99% LM  -Impella P 5--turn down P3 this morning.  . Hemodynamics improved.  - Cardiac Output 4 - Continue norepi 5 mcg and milrinone 0.25 mcg.  - Continue digoxin 0.125 mg daily.  - Continue 12.5 mg spironolactone daily.  - Renal function stable.   2. 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 Acute hypoxic respiratory failure - Extubated 3/14. Sats stable on room air.    4. Expected post-op blood loss - hgb 10  . Transfuse as needed to keep > 7.5  5. Low-grade fever -Sfebrile.  -  WBC ok.  -Completed antibiotic course.   Remove impella later today. Consult PT/OT. Will place PICC line later.    Length of Stay: Young NP-C  09/24/2019, 7:45 AM  Advanced Heart Failure Team Pager [phone] (M-F; 7a - 4p)  Please contact [facility] for night-coverage after hours (4p -7a ) and weekends on [email or web address]  Agree with above.   Remains on Impella at P-6 and NE at 5. Excellent diuresis overnight on lasix gtt. Weight down to 155. Luiz Blare numbers done personally with RN and look good. Impella flows and waveforms look good. On vanc while impell  in. Renal function stable. Oxygenation stable.  General:  Sitting up in bed . No resp difficulty HEENT: normal Neck: supple. RIJ swan . Carotids 2+ bilat; no bruits. No lymphadenopathy or thryomegaly appreciated. Cor: PMI nondisplaced. Regular rate & rhythm. No rubs, gallops or murmurs. Lungs: clear Abdomen: soft, nontender, nondistended. No hepatosplenomegaly. No bruits or masses. Good bowel sounds. Extremities: no cyanosis, clubbing, rash, edema RFA impella  Site ok  Neuro: alert & orientedx3, cranial nerves grossly intact. moves all 4 extremities w/o difficulty. Affect pleasant  Much improved with Impella support Volume status looks good. I have turned Impella down to P-3. Will stop lasix gtt. Give 250 NS back. I will pull Impella over lunch. Heparin only in purge ACT 142  CRITICAL CARE Performed by: Glori Bickers  Total critical care time: 35 minutes  Critical care time was exclusive of separately billable procedures and treating other patients.  Critical care was necessary to treat or prevent imminent or life-threatening deterioration.  Critical care was time spent personally by  me (independent of midlevel providers or residents) on the following activities: development of treatment plan with patient and/or surrogate as well as nursing, discussions with consultants, evaluation of patient's response to treatment, examination of patient, obtaining history from patient or surrogate, ordering and performing treatments and interventions, ordering and review of laboratory studies, ordering and review of radiographic studies, pulse oximetry and re-evaluation of patient's condition.  Glori Bickers, MD  9:07 AM

## 2019-09-24 NOTE — Progress Notes (Signed)
  Impella CP removal note:  Hemodynamics stable on P-3  ACT < 150  I cut sutures.   Impella turned to P-1 and pulled across the valve and down to sheath.   Console then turned off by Impella rep.   I pulled sheath and device together and held manual pressure personally for 40 mins with good hemostasis. Fem stop applied.   Pulses and site intact.   Arvilla Meres, MD  6:56 PM

## 2019-09-24 NOTE — Progress Notes (Signed)
Patient ID: SAATHVIK EVERY, male   DOB: October 01, 1945, 74 y.o.   MRN: [id number] EVENING ROUNDS NOTE :     [address]       [place] [id number]             [phone]                 5 Days Post-Op Procedure(s) (LRB): CORONARY ARTERY BYPASS GRAFTING (CABG) using LIMA to LAD; Endoscopic harvest right saphenous vein: SVG tp Diag; SVG sequential to OM1 and OM2. (N/A) TRANSESOPHAGEAL ECHOCARDIOGRAM (TEE) (N/A) Endovein Harvest Of Greater Saphenous Vein (Right) CLOSURE OF PATENT DUCTUS ARTERIOSUS (N/A)  Total Length of Stay:  LOS: 6 days  BP 112/63   Pulse 82   Temp 98.4 F (36.9 C)   Resp (!) 25   Ht 5' 5.5" (1.664 m)   Wt 70.8 kg   SpO2 97%   BMI 25.58 kg/m   .Intake/Output      03/17 0701 - 03/18 0700 03/18 0701 - 03/19 0700   P.O. 400 300   I.V. (mL/kg) 1250.1 (17.7) 1746.4 (24.7)   Other 314.1 68   IV Piggyback 300 0   Total Intake(mL/kg) 2264.3 (32) 2114.4 (29.9)   Urine (mL/kg/hr) 6170 (3.6) 495 (0.7)   Chest Tube 130 20   Total Output 6300 515   Net -4035.7 +1599.4          . sodium chloride 10 mL/hr at 09/24/19 0700  . sodium chloride    . sodium chloride    . sodium chloride 10 mL/hr at 09/20/19 1900  . amiodarone 30 mg/hr (09/24/19 1700)  . dextrose 5 % Impella 5.0 Purge solution 500 mL (09/19/19 2101)  . EPINEPHrine 4 mg in dextrose 5% 250 mL infusion (16 mcg/mL) Stopped (09/22/19 1613)  . impella catheter heparin 50 unit/mL in dextrose 5%    . lactated ringers 20 mL/hr at 09/22/19 0424  . lactated ringers Stopped (09/24/19 0736)  . milrinone 0.25 mcg/kg/min (09/24/19 1700)  . nitroGLYCERIN Stopped (09/19/19 1558)  . norepinephrine (LEVOPHED) Adult infusion 2 mcg/min (09/24/19 1700)  . phenylephrine (NEO-SYNEPHRINE) Adult infusion Stopped (09/19/19 1559)  . vancomycin    . vasopressin (PITRESSIN) infusion - *FOR SHOCK* Stopped (09/24/19 0700)     Lab Results  Component Value Date   WBC 8.0 09/24/2019   HGB 10.0 (L) 09/24/2019   HCT  29.7 (L) 09/24/2019   PLT 63 (L) 09/24/2019   GLUCOSE 116 (H) 09/24/2019   CHOL 148 09/19/2019   TRIG 50 09/19/2019   HDL 43 09/19/2019   LDLCALC 95 09/19/2019   ALT 41 09/18/2019   AST 74 (H) 09/18/2019   NA 130 (L) 09/24/2019   K 3.9 09/24/2019   CL 87 (L) 09/24/2019   CREATININE 1.25 (H) 09/24/2019   BUN 21 09/24/2019   CO2 30 09/24/2019   TSH 7.235 (H) 09/18/2019   INR 1.6 (H) 09/20/2019   HGBA1C 4.9 09/18/2019   Impella, right groin out- groin stable Feet warm with palpable dp and pt pulses bp stable    Delight Ovens MD  Beeper [phone] Office [phone] 09/24/2019 5:42 PM

## 2019-09-24 NOTE — Progress Notes (Addendum)
Charted on wrong patient

## 2019-09-25 ENCOUNTER — Inpatient Hospital Stay: Payer: Self-pay

## 2019-09-25 LAB — BASIC METABOLIC PANEL
Anion gap: 13 (ref 5–15)
BUN: 20 mg/dL (ref 8–23)
CO2: 26 mmol/L (ref 22–32)
Calcium: 8.5 mg/dL — ABNORMAL LOW (ref 8.9–10.3)
Chloride: 88 mmol/L — ABNORMAL LOW (ref 98–111)
Creatinine, Ser: 0.98 mg/dL (ref 0.61–1.24)
GFR calc Af Amer: >60 mL/min (ref >60)
GFR calc non Af Amer: >60 mL/min (ref >60)
Glucose, Bld: 103 mg/dL — ABNORMAL HIGH (ref 70–99)
Potassium: 3.7 mmol/L (ref 3.5–5.1)
Sodium: 127 mmol/L — ABNORMAL LOW (ref 135–145)

## 2019-09-25 LAB — CBC WITH DIFFERENTIAL/PLATELET
Abs Immature Granulocytes: 0.09 10*3/uL — ABNORMAL HIGH (ref 0.00–0.07)
Basophils Absolute: 0 10*3/uL (ref 0.0–0.1)
Basophils Relative: 0 %
Eosinophils Absolute: 0.1 10*3/uL (ref 0.0–0.5)
Eosinophils Relative: 2 %
HCT: 28.2 % — ABNORMAL LOW (ref 39.0–52.0)
Hemoglobin: 9.6 g/dL — ABNORMAL LOW (ref 13.0–17.0)
Immature Granulocytes: 2 %
Lymphocytes Relative: 23 %
Lymphs Abs: 1.2 10*3/uL (ref 0.7–4.0)
MCH: 30.9 pg (ref 26.0–34.0)
MCHC: 34 g/dL (ref 30.0–36.0)
MCV: 90.7 fL (ref 80.0–100.0)
Monocytes Absolute: 0.7 10*3/uL (ref 0.1–1.0)
Monocytes Relative: 14 %
Neutro Abs: 3.1 10*3/uL (ref 1.7–7.7)
Neutrophils Relative %: 59 %
Platelets: 83 10*3/uL — ABNORMAL LOW (ref 150–400)
RBC: 3.11 MIL/uL — ABNORMAL LOW (ref 4.22–5.81)
RDW: 13.7 % (ref 11.5–15.5)
WBC: 5.2 10*3/uL (ref 4.0–10.5)
nRBC: 0.6 % — ABNORMAL HIGH (ref 0.0–0.2)

## 2019-09-25 LAB — COOXEMETRY PANEL
Carboxyhemoglobin: 2.3 % — ABNORMAL HIGH (ref 0.5–1.5)
Methemoglobin: 1.4 % (ref 0.0–1.5)
O2 Saturation: 55.5 %
Total hemoglobin: 9.9 g/dL — ABNORMAL LOW (ref 12.0–16.0)

## 2019-09-25 LAB — GLUCOSE, CAPILLARY
Glucose-Capillary: 102 mg/dL — ABNORMAL HIGH (ref 70–99)
Glucose-Capillary: 169 mg/dL — ABNORMAL HIGH (ref 70–99)
Glucose-Capillary: 117 mg/dL — ABNORMAL HIGH (ref 70–99)
Glucose-Capillary: 100 mg/dL — ABNORMAL HIGH (ref 70–99)
Glucose-Capillary: 131 mg/dL — ABNORMAL HIGH (ref 70–99)

## 2019-09-25 LAB — HEPARIN LEVEL (UNFRACTIONATED): Heparin Unfractionated: <0.1 IU/mL — ABNORMAL LOW (ref 0.30–0.70)

## 2019-09-25 LAB — LACTATE DEHYDROGENASE: LDH: 221 U/L — ABNORMAL HIGH (ref 98–192)

## 2019-09-25 MED ORDER — AMIODARONE HCL 200 MG PO TABS
200.0000 mg | ORAL_TABLET | Freq: Two times a day (BID) | ORAL | Status: DC
  Administered 2019-09-25 – 2019-10-01 (×12): 200 mg via ORAL
  Filled 2019-09-25 (×12): qty 1

## 2019-09-25 NOTE — Evaluation (Signed)
Physical Therapy Evaluation Patient Details Name: Dennis Roberts MRN: [id number] DOB: 1945-07-29 Today's Date: 09/25/2019   History of Present Illness  Pt adm with acute systolic heart failure and cariogenic shock. Impella placed 3/12. 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 Impella removed 3/18. PMH - none.  Clinical Impression  Pt admitted with above diagnosis and presents to PT with functional limitations due to deficits listed below (See PT problem list). Pt needs skilled PT to maximize independence and safety to allow discharge to home with family support if needed. Expect pt will make excellent progress and be able to return home.      Follow Up Recommendations No PT follow up    Equipment Recommendations  Other (comment)(To be determined)    Recommendations for Other Services       Precautions / Restrictions Precautions Precautions: Sternal      Mobility  Bed Mobility Overal bed mobility: Needs Assistance Bed Mobility: Supine to Sit;Sit to Supine     Supine to sit: Min assist;HOB elevated Sit to supine: Mod assist   General bed mobility comments: Assist to elevate trunk into sitting. Assist to lower trunk and bring legs back into bed returning to supine  Transfers Overall transfer level: Needs assistance Equipment used: 1 person hand held assist Transfers: Sit to/from Stand Sit to Stand: Mod assist;+2 safety/equipment         General transfer comment: Assist to bring hips up.   Ambulation/Gait Ambulation/Gait assistance: Min assist;+2 safety/equipment Gait Distance (Feet): 2 Feet Assistive device: 1 person hand held assist Gait Pattern/deviations: Step-to pattern;Shuffle;Decreased step length - right;Decreased step length - left Gait velocity: decr Gait velocity interpretation: <1.31 ft/sec, indicative of household ambulator General Gait Details: Assist for balance and support as pt side stepped up bed. Did not attempt further due to lines  Stairs             Wheelchair Mobility    Modified Rankin (Stroke Patients Only)       Balance Overall balance assessment: Mild deficits observed, not formally tested                                           Pertinent Vitals/Pain Pain Assessment: 0-10 Pain Score: 0-No pain    Home Living Family/patient expects to be discharged to:: Private residence Living Arrangements: Alone Available Help at Discharge: Family;Available 24 hours/day(sister or son can be there if needed) Type of Home: House Home Access: Stairs to enter Entrance Stairs-Rails: Right Entrance Stairs-Number of Steps: 2 Home Layout: One level   Additional Comments: Has some old equipment from mother possibly available    Prior Function Level of Independence: Independent               Hand Dominance   Dominant Hand: Left    Extremity/Trunk Assessment   Upper Extremity Assessment Upper Extremity Assessment: Generalized weakness    Lower Extremity Assessment Lower Extremity Assessment: Generalized weakness       Communication   Communication: No difficulties  Cognition Arousal/Alertness: Awake/alert Behavior During Therapy: WFL for tasks assessed/performed Overall Cognitive Status: Within Functional Limits for tasks assessed                                        General Comments General comments (skin integrity, edema, etc.):  VSS    Exercises     Assessment/Plan    PT Assessment Patient needs continued PT services  PT Problem List Decreased strength;Decreased activity tolerance;Decreased mobility;Decreased knowledge of precautions       PT Treatment Interventions DME instruction;Gait training;Functional mobility training;Stair training;Therapeutic activities;Therapeutic exercise;Patient/family education    PT Goals (Current goals can be found in the Care Plan section)  Acute Rehab PT Goals Patient Stated Goal: return home PT Goal Formulation: With  patient Time For Goal Achievement: 10/09/19 Potential to Achieve Goals: Good    Frequency Min 3X/week   Barriers to discharge Decreased caregiver support Lives alone but family supportive and can do what's needed    Co-evaluation               AM-PAC PT "6 Clicks" Mobility  Outcome Measure Help needed turning from your back to your side while in a flat bed without using bedrails?: A Little Help needed moving from lying on your back to sitting on the side of a flat bed without using bedrails?: A Little Help needed moving to and from a bed to a chair (including a wheelchair)?: A Little Help needed standing up from a chair using your arms (e.g., wheelchair or bedside chair)?: A Little Help needed to walk in hospital room?: A Little Help needed climbing 3-5 steps with a railing? : A Lot 6 Click Score: 17    End of Session   Activity Tolerance: Patient tolerated treatment well Patient left: in bed;with call bell/phone within reach;with family/visitor present Nurse Communication: Mobility status PT Visit Diagnosis: Other abnormalities of gait and mobility (R26.89)    Time: 1740-8144 PT Time Calculation (min) (ACUTE ONLY): 29 min   Charges:   PT Evaluation $PT Eval Moderate Complexity: 1 Mod PT Treatments $Therapeutic Activity: 8-22 mins        [facility] PT Acute Rehabilitation Services Pager [phone] Office [phone]   Angelina Ok [facility] 09/25/2019, 4:39 PM

## 2019-09-25 NOTE — Progress Notes (Signed)
EVENING ROUNDS NOTE :     [address]       [place] [id number]             [phone]                 6 Days Post-Op Procedure(s) (LRB): CORONARY ARTERY BYPASS GRAFTING (CABG) using LIMA to LAD; Endoscopic harvest right saphenous vein: SVG tp Diag; SVG sequential to OM1 and OM2. (N/A) TRANSESOPHAGEAL ECHOCARDIOGRAM (TEE) (N/A) Endovein Harvest Of Greater Saphenous Vein (Right) CLOSURE OF PATENT DUCTUS ARTERIOSUS (N/A)   Total Length of Stay:  LOS: 7 days  Events:  Comfortable in bed No events    BP 108/61   Pulse 77   Temp 98.4 F (36.9 C)   Resp 20   Ht 5' 5.5" (1.664 m)   Wt 70.1 kg   SpO2 100%   BMI 25.33 kg/m   PAP: (16-26)/(3-15) 17/7 CVP:  [0 mmHg-11 mmHg] 6 mmHg PCWP:  [2 mmHg] 2 mmHg CO:  [3.9 L/min] 3.9 L/min CI:  [2.2 L/min/m2] 2.2 L/min/m2  FiO2 (%):  [40 %] 40 %  . sodium chloride 10 mL/hr at 09/20/19 1900  . dextrose 5 % Impella 5.0 Purge solution 500 mL (09/19/19 2101)  . EPINEPHrine 4 mg in dextrose 5% 250 mL infusion (16 mcg/mL) Stopped (09/22/19 1613)  . milrinone 0.25 mcg/kg/min (09/25/19 1600)  . norepinephrine (LEVOPHED) Adult infusion Stopped (09/25/19 2500)    I/O last 3 completed shifts: In: 3451.7 [P.O.:300; I.V.:2676.4; Other:225.3; IV Piggyback:250] Out: 3704 [UGQBV:6945; Chest Tube:270]   CBC Latest Ref Rng & Units 09/25/2019 09/24/2019 09/24/2019  WBC 4.0 - 10.5 K/uL 5.2 5.9 8.0  Hemoglobin 13.0 - 17.0 g/dL 0.3(U) 8.8(K) 10.0(L)  Hematocrit 39.0 - 52.0 % 28.2(L) 28.4(L) 29.7(L)  Platelets 150 - 400 K/uL 83(L) 67(L) 63(L)    BMP Latest Ref Rng & Units 09/25/2019 09/24/2019 09/23/2019  Glucose 70 - 99 mg/dL 800(L) 491(P) 915(A)  BUN 8 - 23 mg/dL 20 21 56(P)  Creatinine 0.61 - 1.24 mg/dL 7.94 8.01(K) 5.53  Sodium 135 - 145 mmol/L 127(L) 130(L) 126(L)  Potassium 3.5 - 5.1 mmol/L 3.7 3.9 3.6  Chloride 98 - 111 mmol/L 88(L) 87(L) 87(L)  CO2 22 - 32 mmol/L 26 30 29   Calcium 8.9 - 10.3 mg/dL ) 7.4(M) 8.1(L)     ABG    Component Value Date/Time   PHART 7.469 (H) 09/20/2019 1944   PCO2ART 31.8 (L) 09/20/2019 1944   PO2ART 123.0 (H) 09/20/2019 1944   HCO3 23.2 09/20/2019 1944   TCO2 24 09/20/2019 1944   ACIDBASEDEF 2.0 09/20/2019 1814   O2SAT 55.5 09/25/2019 0421       09/27/2019, MD 09/25/2019 5:21 PM

## 2019-09-25 NOTE — Progress Notes (Addendum)
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  - Impella removed 3/18  Remains on milrinone 0.25 mcg. Co-ox 56% today    Swan numbers  CVP 2 PA  22/14 CO 4.87 CI 2.75   Wt down 2 lb, -1.7L in UOP yesterday. SCr improved, 1.25>>0.98   Sitting up in bed. Feels ok. No cardiac complaints. Getting clear liquid diet.    Objective:   Weight Range:  Vital Signs:   Temp:  [97.9 F (36.6 C)-99 F (37.2 C)] 98.2 F (36.8 C) (03/19 0800) Pulse Rate:  [72-82] 80 (03/19 0800) Resp:  [17-42] 33 (03/19 0800) BP: (93-113)/(59-74) 104/62 (03/19 0800) SpO2:  [95 %-98 %] 97 % (03/19 0800) Arterial Line BP: (111-142)/(42-59) 131/52 (03/19 0800) FiO2 (%):  [40 %] 40 % (03/19 0800) Weight:  [70.1 kg] 70.1 kg (03/19 0600) Last BM Date: (pta)  Weight change: Filed Weights   09/23/19 2107 09/24/19 0630 09/25/19 0600  Weight: 73.2 kg 70.8 kg 70.1 kg    Intake/Output:   Intake/Output Summary (Last 24 hours) at 09/25/2019 0848 Last data filed at 09/25/2019 0800 Gross per 24 hour  Intake 2579.82 ml  Output 1860 ml  Net 719.82 ml    CVP 2 Physical Exam: General:  Elderly WM sitting up in bed, No resp difficulty HEENT: normal Neck: supple. no JVD. Carotids 2+ bilat; no bruits. No lymphadenopathy or thryomegaly appreciated.LIJ/RIJ Swan  Cor: PMI nondisplaced. Regular rate & rhythm. No rubs, gallops or murmurs .Sternal incision stable. + CTs Lungs: clear bilaterally Abdomen: soft, nontender, nondistended. No hepatosplenomegaly. No bruits or masses. Good bowel sounds. Extremities: no cyanosis, clubbing, rash, edema.  Neuro: alert & orientedx3, cranial nerves grossly intact. moves all 4 extremities w/o difficulty. Affect pleasant  Telemetry: SR mid 70s personally reviewed.  Labs: Basic Metabolic Panel: Recent Labs  Lab  09/18/19 2005 09/19/19 0238 09/20/19 0426 09/20/19 1216 09/20/19 1614 09/20/19 1814 09/22/19 0357 09/22/19 0357 09/23/19 0437 09/23/19 0437 09/23/19 1455 09/24/19 0354 09/24/19 0620 09/25/19 0421  NA 134*   < > 134*   < > 132*   < > 132*  --  132*  --  126*  --  130* 127*  K 4.3   < > 3.7   < > 3.9   < > 4.0  --  3.7  --  3.6  --  3.9 3.7  CL 102   < > 105   < > 104   < > 99  --  91*  --  87*  --  87* 88*  CO2 21*   < > 20*   < > 20*   < > 23  --  27  --  29  --  30 26  GLUCOSE 130*   < > 117*   < > 117*   < > 113*  --  108*  --  124*  --  116* 103*  BUN 34*   < > 24*   < > 21   < > 24*  --  24*  --  24*  --  21 20  CREATININE 1.16   < > 1.27*   < > 1.12   < > 1.03  --  1.17  --  1.21  --  1.25* 0.98  CALCIUM 8.7*   < > 8.0*   < >  7.6*   < > 8.0*   < > 8.4*   < > 8.1*  --  8.8* 8.5*  MG 1.9  --  2.2  --  2.0  --   --   --  1.8  --   --  1.9  --   --    < > = values in this interval not displayed.    Liver Function Tests: Recent Labs  Lab 09/18/19 1300 09/18/19 2005  AST 37 74*  ALT 31 41  ALKPHOS 76 71  BILITOT 1.3* 2.0*  PROT 6.7 6.2*  ALBUMIN 4.3 3.9   No results for input(s): LIPASE, AMYLASE in the last 168 hours. No results for input(s): AMMONIA in the last 168 hours.  CBC: Recent Labs  Lab 09/21/19 0430 09/21/19 0430 09/22/19 0357 09/23/19 0437 09/24/19 0354 09/24/19 1727 09/25/19 0421  WBC 8.6   < > 5.9 7.3 8.0 5.9 5.2  NEUTROABS 6.1  --  4.3 5.2 5.6  --  3.1  HGB 8.1*   < > 7.9* 8.5* 10.0* 9.6* 9.6*  HCT 24.1*   < > 23.8* 25.2* 29.7* 28.4* 28.2*  MCV 90.9   < > 93.7 91.3 90.8 89.3 90.7  PLT 56*   < > 46* 48* 63* 67* 83*   < > = values in this interval not displayed.    Cardiac Enzymes: No results for input(s): CKTOTAL, CKMB, CKMBINDEX, TROPONINI in the last 168 hours.  BNP: BNP (last 3 results) Recent Labs    09/18/19 1300  BNP 1,784.5*    ProBNP (last 3 results) No results for input(s): PROBNP in the last 8760 hours.    Other  results:  Imaging: DG Chest 1 View  Result Date: 09/24/2019 CLINICAL DATA:  Chest tube status post open heart surgery EXAM: CHEST  1 VIEW COMPARISON:  09/22/2019 FINDINGS: No significant change in AP portable examination with extensive support apparatus including Impella device. Bilateral chest and mediastinal drainage tubes remain in position without significant pneumothorax. Small bilateral pleural effusions. Cardiomegaly status post median sternotomy. IMPRESSION: No significant interval change in postoperative radiograph. No significant pneumothorax. No new airspace opacity. Electronically Signed   By: Eddie Candle M.D.   On: 09/24/2019 08:34     Medications:     Scheduled Medications: . aspirin EC  325 mg Oral Daily   Or  . aspirin  324 mg Per Tube Daily  . bisacodyl  10 mg Oral Daily   Or  . bisacodyl  10 mg Rectal Daily  . chlorhexidine gluconate (MEDLINE KIT)  15 mL Mouth Rinse BID  . Chlorhexidine Gluconate Cloth  6 each Topical Daily  . colchicine  0.3 mg Oral BID  . digoxin  0.125 mg Oral Daily  . docusate sodium  200 mg Oral Daily  . insulin aspart  0-24 Units Subcutaneous Q4H  . pantoprazole  40 mg Oral Daily  . sodium chloride flush  10-40 mL Intracatheter Q12H  . sodium chloride flush  3 mL Intravenous Q12H  . sodium chloride flush  3 mL Intravenous Q12H  . spironolactone  12.5 mg Oral Daily    Infusions: . sodium chloride 10 mL/hr at 09/24/19 0700  . sodium chloride    . sodium chloride    . sodium chloride 10 mL/hr at 09/20/19 1900  . amiodarone 30 mg/hr (09/25/19 0800)  . dextrose 5 % Impella 5.0 Purge solution 500 mL (09/19/19 2101)  . EPINEPHrine 4 mg in dextrose 5% 250 mL infusion (16 mcg/mL) Stopped (  09/22/19 1613)  . impella catheter heparin 50 unit/mL in dextrose 5%    . lactated ringers 20 mL/hr at 09/22/19 0424  . lactated ringers Stopped (09/24/19 0736)  . milrinone 0.25 mcg/kg/min (09/25/19 0800)  . nitroGLYCERIN Stopped (09/19/19 1558)  .  norepinephrine (LEVOPHED) Adult infusion Stopped (09/25/19 2952)  . phenylephrine (NEO-SYNEPHRINE) Adult infusion Stopped (09/19/19 1559)  . vancomycin Stopped (09/24/19 2345)  . vasopressin (PITRESSIN) infusion - *FOR SHOCK* Stopped (09/24/19 0700)    PRN Medications: sodium chloride, sodium chloride, acetaminophen, ALPRAZolam, lip balm, midazolam, morphine injection, ondansetron (ZOFRAN) IV, oxyCODONE, sodium chloride flush, sodium chloride flush, sodium chloride flush, traMADol, zolpidem   Assessment/Plan:   1. Acute systolic HF ->Cardiogenic shock - 09/18/19: Echo EF 20% RV ok - 09/18/19: Cath 3v CAD with 99% LM. Required Impella support -  Hemodynamics improved.   - Impella extracted 3/18 - Continue w/ Luiz Blare. Cardiac Output 4.9. CI 2.8  - Continue milrinone 0.25 mcg. Co-ox 56% - Continue digoxin 0.125 mg daily.  - Continue 12.5 mg spironolactone daily.  - Renal function stable.  - Volume status stable. CVP 2. Currently off diuretics.   2. 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 Acute hypoxic respiratory failure - Extubated 3/14. Sats stable on room air.    4. Expected post-op blood loss - hgb 9.6  . Transfuse as needed to keep > 7.5  5. Low-grade fever - AF past 24 hrs - WBC ok.  -Completed antibiotic course.   6. Hyponatremia: - Na 127. Mentating ok - Monitor. Consider Tolvaptan if further decrease    Length of Stay: [place] PA-C  09/25/2019, 8:48 AM  Advanced Heart Failure Team Pager [phone] (M-F; [place])  Please contact [facility] for night-coverage after hours (4p -7a ) and weekends on [email or web address]   Agree.  Impella removed yesterday. Remains on milrinone 0.25.Co-ox 56%  Swan numbers much improved. Volume status ok. Able to sit up with PT. Maintaining NSR on IV amio.   General:  Sitting up in bed. No resp  difficulty HEENT: normal Neck: supple. RIJ swan . Carotids 2+ bilat; no bruits. No lymphadenopathy or thryomegaly appreciated. Cor: sternal wound  PMI nondisplaced. Regular rate & rhythm. No rubs, gallops or murmurs. Lungs: clear Abdomen: soft, nontender, nondistended. No hepatosplenomegaly. No bruits or masses. Good bowel sounds. Extremities: no cyanosis, clubbing, rash, edema impella site ok  Neuro: alert & orientedx3, cranial nerves grossly intact. moves all 4 extremities w/o difficulty. Affect pleasant  Impella out. Remains on milrinone 025 with marginal co-ox. Volume status ok (low).  Will pull swan and central line. Place picc.   Switch IV amio to po. Agree with digoxin and spiro. Add low-dose losartan. Would not AC unless post-op AF recurs.   Continue PT/OT.   CRITICAL CARE Performed by: Glori Bickers  Total critical care time: 35 minutes  Critical care time was exclusive of separately billable procedures and treating other patients.  Critical care was necessary to treat or prevent imminent or life-threatening deterioration.  Critical care was time spent personally by me (independent of midlevel providers or residents) on the following activities: development of treatment plan with patient and/or surrogate as well as nursing, discussions with consultants, evaluation of patient's response to treatment, examination of patient, obtaining history from patient or surrogate, ordering and performing treatments and interventions, ordering and  review of laboratory studies, ordering and review of radiographic studies, pulse oximetry and re-evaluation of patient's condition.  Glori Bickers, MD  4:37 PM

## 2019-09-26 LAB — CBC WITH DIFFERENTIAL/PLATELET
Abs Immature Granulocytes: 0.11 10*3/uL — ABNORMAL HIGH (ref 0.00–0.07)
Basophils Absolute: 0 10*3/uL (ref 0.0–0.1)
Basophils Relative: 0 %
Eosinophils Absolute: 0.1 10*3/uL (ref 0.0–0.5)
Eosinophils Relative: 2 %
HCT: 28.5 % — ABNORMAL LOW (ref 39.0–52.0)
Hemoglobin: 9.8 g/dL — ABNORMAL LOW (ref 13.0–17.0)
Immature Granulocytes: 2 %
Lymphocytes Relative: 20 %
Lymphs Abs: 1.4 10*3/uL (ref 0.7–4.0)
MCH: 30.2 pg (ref 26.0–34.0)
MCHC: 34.4 g/dL (ref 30.0–36.0)
MCV: 87.7 fL (ref 80.0–100.0)
Monocytes Absolute: 0.9 10*3/uL (ref 0.1–1.0)
Monocytes Relative: 13 %
Neutro Abs: 4.2 10*3/uL (ref 1.7–7.7)
Neutrophils Relative %: 63 %
Platelets: 124 10*3/uL — ABNORMAL LOW (ref 150–400)
RBC: 3.25 MIL/uL — ABNORMAL LOW (ref 4.22–5.81)
RDW: 13.7 % (ref 11.5–15.5)
WBC: 6.7 10*3/uL (ref 4.0–10.5)
nRBC: 0.3 % — ABNORMAL HIGH (ref 0.0–0.2)

## 2019-09-26 LAB — COOXEMETRY PANEL
Carboxyhemoglobin: 2.6 % — ABNORMAL HIGH (ref 0.5–1.5)
Carboxyhemoglobin: 2.1 % — ABNORMAL HIGH (ref 0.5–1.5)
Methemoglobin: 1.1 % (ref 0.0–1.5)
Methemoglobin: 1.1 % (ref 0.0–1.5)
O2 Saturation: 98.4 %
O2 Saturation: 49.5 %
Total hemoglobin: 10 g/dL — ABNORMAL LOW (ref 12.0–16.0)
Total hemoglobin: 10.6 g/dL — ABNORMAL LOW (ref 12.0–16.0)

## 2019-09-26 LAB — BASIC METABOLIC PANEL
Anion gap: 10 (ref 5–15)
BUN: 19 mg/dL (ref 8–23)
CO2: 26 mmol/L (ref 22–32)
Calcium: 8.4 mg/dL — ABNORMAL LOW (ref 8.9–10.3)
Chloride: 92 mmol/L — ABNORMAL LOW (ref 98–111)
Creatinine, Ser: 0.86 mg/dL (ref 0.61–1.24)
GFR calc Af Amer: >60 mL/min (ref >60)
GFR calc non Af Amer: >60 mL/min (ref >60)
Glucose, Bld: 111 mg/dL — ABNORMAL HIGH (ref 70–99)
Potassium: 3.4 mmol/L — ABNORMAL LOW (ref 3.5–5.1)
Sodium: 128 mmol/L — ABNORMAL LOW (ref 135–145)

## 2019-09-26 LAB — GLUCOSE, CAPILLARY
Glucose-Capillary: 117 mg/dL — ABNORMAL HIGH (ref 70–99)
Glucose-Capillary: 138 mg/dL — ABNORMAL HIGH (ref 70–99)
Glucose-Capillary: 150 mg/dL — ABNORMAL HIGH (ref 70–99)
Glucose-Capillary: 85 mg/dL (ref 70–99)
Glucose-Capillary: 93 mg/dL (ref 70–99)
Glucose-Capillary: 96 mg/dL (ref 70–99)
Glucose-Capillary: 99 mg/dL (ref 70–99)

## 2019-09-26 LAB — LACTATE DEHYDROGENASE: LDH: 188 U/L (ref 98–192)

## 2019-09-26 LAB — HEPARIN LEVEL (UNFRACTIONATED): Heparin Unfractionated: <0.1 IU/mL — ABNORMAL LOW (ref 0.30–0.70)

## 2019-09-26 MED ORDER — SODIUM CHLORIDE 0.9% FLUSH: 10.0000 mL | INTRAVENOUS | Status: DC | PRN

## 2019-09-26 MED ORDER — SORBITOL 70 % SOLN
30.0000 mL | Freq: Once | Status: DC
  Filled 2019-09-26: qty 30

## 2019-09-26 MED ORDER — POTASSIUM CHLORIDE CRYS ER 20 MEQ PO TBCR
40.0000 meq | EXTENDED_RELEASE_TABLET | Freq: Once | ORAL | Status: AC
  Administered 2019-09-26: 17:00:00 40 meq via ORAL
  Filled 2019-09-26: qty 2

## 2019-09-26 MED ORDER — SODIUM CHLORIDE 0.9% FLUSH
10.0000 mL | Freq: Two times a day (BID) | INTRAVENOUS | Status: DC
  Administered 2019-09-26 – 2019-10-01 (×9): 10 mL

## 2019-09-26 MED ORDER — SODIUM CHLORIDE 0.9 % IV BOLUS
500.0000 mL | Freq: Once | INTRAVENOUS | Status: AC
  Administered 2019-09-26: 500 mL via INTRAVENOUS

## 2019-09-26 MED ORDER — CHLORHEXIDINE GLUCONATE 0.12 % MT SOLN
OROMUCOSAL | Status: AC
  Filled 2019-09-26: qty 15

## 2019-09-26 NOTE — Progress Notes (Signed)
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  - Impella removed 3/18  Remains on milrinone 0.25 mcg. Co-ox 50% today    Walked for the first time this am. Feels weak and dizzy. Mild DOE. Rhythm stable. No BM.   Weight below baseline. CVP  1   Objective:   Weight Range:  Vital Signs:   Temp:  [97.5 F (36.4 C)-98.8 F (37.1 C)] 97.5 F (36.4 C) (03/20 0758) Pulse Rate:  [73-88] 82 (03/20 0900) Resp:  [17-32] 19 (03/20 0900) BP: (87-121)/(57-70) 99/64 (03/20 0900) SpO2:  [96 %-100 %] 98 % (03/20 0900) Arterial Line BP: (113-156)/(40-55) 128/43 (03/20 0900) Weight:  [69 kg] 69 kg (03/20 0500) Last BM Date: (pta)  Weight change: Filed Weights   09/24/19 0630 09/25/19 0600 09/26/19 0500  Weight: 70.8 kg 70.1 kg 69 kg    Intake/Output:   Intake/Output Summary (Last 24 hours) at 09/26/2019 0950 Last data filed at 09/26/2019 0600 Gross per 24 hour  Intake 376.97 ml  Output 1290 ml  Net -913.03 ml     Physical Exam: General: Sitting up in chair. Weak appearing No resp difficulty HEENT: normal Neck: supple. no JVD. RIJ introducer  LIJ TLC Carotids 2+ bilat; no bruits. No lymphadenopathy or thryomegaly appreciated. Cor: sternal wound ok PMI nondisplaced. Regular rate & rhythm. No rubs, gallops or murmurs. Lungs: clear decreased at bases Abdomen: soft, nontender, + nondistended. No hepatosplenomegaly. No bruits or masses. Hypoactive bowel sounds. Extremities: no cyanosis, clubbing, rash, edema Neuro: alert & orientedx3, cranial nerves grossly intact. moves all 4 extremities w/o difficulty. Affect pleasant   Telemetry: SR 80s Personally reviewed   Labs: Basic Metabolic Panel: Recent Labs  Lab 09/20/19 0426 09/20/19 1216 09/20/19 1614 09/20/19 1814 09/22/19 0357 09/22/19 0357 09/23/19 0437 09/23/19 0437  09/23/19 1455 09/24/19 0354 09/24/19 0620 09/25/19 0421  NA 134*   < > 132*   < > 132*  --  132*  --  126*  --  130* 127*  K 3.7   < > 3.9   < > 4.0  --  3.7  --  3.6  --  3.9 3.7  CL 105   < > 104   < > 99  --  91*  --  87*  --  87* 88*  CO2 20*   < > 20*   < > 23  --  27  --  29  --  30 26  GLUCOSE 117*   < > 117*   < > 113*  --  108*  --  124*  --  116* 103*  BUN 24*   < > 21   < > 24*  --  24*  --  24*  --  21 20  CREATININE 1.27*   < > 1.12   < > 1.03  --  1.17  --  1.21  --  1.25* 0.98  CALCIUM 8.0*   < > 7.6*   < > 8.0*   < > 8.4*   < > 8.1*  --  8.8* 8.5*  MG 2.2  --  2.0  --   --   --  1.8  --   --  1.9  --   --    < > = values in this interval not displayed.  Liver Function Tests: No results for input(s): AST, ALT, ALKPHOS, BILITOT, PROT, ALBUMIN in the last 168 hours. No results for input(s): LIPASE, AMYLASE in the last 168 hours. No results for input(s): AMMONIA in the last 168 hours.  CBC: Recent Labs  Lab 09/22/19 0357 09/22/19 0357 09/23/19 0437 09/24/19 0354 09/24/19 1727 09/25/19 0421 09/26/19 0402  WBC 5.9   < > 7.3 8.0 5.9 5.2 6.7  NEUTROABS 4.3  --  5.2 5.6  --  3.1 4.2  HGB 7.9*   < > 8.5* 10.0* 9.6* 9.6* 9.8*  HCT 23.8*   < > 25.2* 29.7* 28.4* 28.2* 28.5*  MCV 93.7   < > 91.3 90.8 89.3 90.7 87.7  PLT 46*   < > 48* 63* 67* 83* 124*   < > = values in this interval not displayed.    Cardiac Enzymes: No results for input(s): CKTOTAL, CKMB, CKMBINDEX, TROPONINI in the last 168 hours.  BNP: BNP (last 3 results) Recent Labs    09/18/19 1300  BNP 1,784.5*    ProBNP (last 3 results) No results for input(s): PROBNP in the last 8760 hours.    Other results:  Imaging: [place] EKG SITE RITE  Result Date: 09/25/2019 If Site Rite image not attached, placement could not be confirmed due to current cardiac rhythm.    Medications:     Scheduled Medications: . amiodarone  200 mg Oral BID  . aspirin EC  325 mg Oral Daily   Or  . aspirin  324 mg  Per Tube Daily  . bisacodyl  10 mg Oral Daily   Or  . bisacodyl  10 mg Rectal Daily  . chlorhexidine gluconate (MEDLINE KIT)  15 mL Mouth Rinse BID  . Chlorhexidine Gluconate Cloth  6 each Topical Daily  . colchicine  0.3 mg Oral BID  . digoxin  0.125 mg Oral Daily  . docusate sodium  200 mg Oral Daily  . insulin aspart  0-24 Units Subcutaneous Q4H  . pantoprazole  40 mg Oral Daily  . spironolactone  12.5 mg Oral Daily    Infusions:  . sodium chloride 10 mL/hr at 09/20/19 1900  . milrinone 0.25 mcg/kg/min (09/26/19 0621)    PRN Medications: acetaminophen, ALPRAZolam, lip balm, midazolam, morphine injection, ondansetron (ZOFRAN) IV, oxyCODONE, sodium chloride flush, traMADol, zolpidem   Assessment/Plan:   1. Acute systolic HF ->Cardiogenic shock - 09/18/19: Echo EF 20% RV ok - 09/18/19: Cath 3v CAD with 99% LM. Required Impella support -  Impella out 3/18. Swan out 3/19 -  On milrinone 0.25 mcg. Co-ox 50% CVP 1. He is dry  - Give 500cc back - No diuretics - Continue digoxin 0.125 mg daily.  - Continue 12.5 mg spironolactone daily.  - check BMET today  2. 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 Acute hypoxic respiratory failure - Extubated 3/14. Sats stable on room air.    4. Expected post-op blood loss - hgb 9.8 . Transfuse as needed to keep > 7.5  5, Hyponatremia: - Na 127yesterday - check BMET today  6. Constipation - sorbitol  Plan: - Give IVF - Introducer and central line out once PICC in - Continue milrinone - Art line and Foley out - Mobilize     Length of Stay: 8   Daniel Bensimhon MD 09/26/2019, 9:50 AM  Advanced Heart Failure Team Pager [phone] (  M-F; 7a - 4p)  Please contact [facility] for night-coverage after hours (4p -7a ) and weekends on [email or web address]

## 2019-09-26 NOTE — Progress Notes (Signed)
[address]       [place] [address]             [phone]                 7 Days Post-Op Procedure(s) (LRB): CORONARY ARTERY BYPASS GRAFTING (CABG) using LIMA to LAD; Endoscopic harvest right saphenous vein: SVG tp Diag; SVG sequential to OM1 and OM2. (N/A) TRANSESOPHAGEAL ECHOCARDIOGRAM (TEE) (N/A) Endovein Harvest Of Greater Saphenous Vein (Right) CLOSURE OF PATENT DUCTUS ARTERIOSUS (N/A)   Events: No events overnight Walked this morning for the first time _______________________________________________________________ Vitals: BP 100/68   Pulse 87   Temp (!) 97.5 F (36.4 C) (Oral)   Resp (!) 31   Ht 5' 5.5" (1.664 m)   Wt 69 kg   SpO2 98%   BMI 24.93 kg/m   - Neuro: alert NAD  - Cardiovascular: sinus  Drips: milr 0.25.   PAP: (15-24)/(4-13) 15/4 CVP:  [0 mmHg-8 mmHg] 8 mmHg PCWP:  [2 mmHg] 2 mmHg CO:  [3.9 L/min] 3.9 L/min CI:  [2.2 L/min/m2] 2.2 L/min/m2  - Pulm: clear, EWOB    ABG    Component Value Date/Time   PHART 7.469 (H) 09/20/2019 1944   PCO2ART 31.8 (L) 09/20/2019 1944   PO2ART 123.0 (H) 09/20/2019 1944   HCO3 23.2 09/20/2019 1944   TCO2 24 09/20/2019 1944   ACIDBASEDEF 2.0 09/20/2019 1814   O2SAT 49.5 09/26/2019 0735    - Abd: soft - Extremity: warm  .Intake/Output      03/19 0701 - 03/20 0700 03/20 0701 - 03/21 0700   P.O.     I.V. (mL/kg) 581.6 (8.4)    Other     IV Piggyback     Total Intake(mL/kg) 581.6 (8.4)    Urine (mL/kg/hr) 1290 (0.8)    Chest Tube     Total Output 1290    Net -708.4            _______________________________________________________________ Labs: CBC Latest Ref Rng & Units 09/26/2019 09/25/2019 09/24/2019  WBC 4.0 - 10.5 K/uL 6.7 5.2 5.9  Hemoglobin 13.0 - 17.0 g/dL 7.8(E) 4.2(P) 5.3(I)  Hematocrit 39.0 - 52.0 % 28.5(L) 28.2(L) 28.4(L)  Platelets 150 - 400 K/uL 124(L) 83(L) 67(L)   CMP Latest Ref Rng & Units 09/25/2019 09/24/2019 09/23/2019  Glucose 70 - 99 mg/dL 144(R)  154(M) 086(P)  BUN 8 - 23 mg/dL 20 21 61(P)  Creatinine 0.61 - 1.24 mg/dL 5.09 3.26(Z) 1.24  Sodium 135 - 145 mmol/L 127(L) 130(L) 126(L)  Potassium 3.5 - 5.1 mmol/L 3.7 3.9 3.6  Chloride 98 - 111 mmol/L 88(L) 87(L) 87(L)  CO2 22 - 32 mmol/L 26 30 29   Calcium 8.9 - 10.3 mg/dL ) 5.8(K) 8.1(L)  Total Protein 6.5 - 8.1 g/dL - - -  Total Bilirubin 0.3 - 1.2 mg/dL - - -  Alkaline Phos 38 - 126 U/L - - -  AST 15 - 41 U/L - - -  ALT 0 - 44 U/L - - -    CXR: pending  _______________________________________________________________  Assessment and Plan: POD 7 s/p CABG, impella placement (removed on 3/18) Doing well   Neuro: pain controlled CV: good blood pressure, remains on milr.  Will keep for now.  Will discuss removing a line Pulm: continue pulm toilet Renal: good uop.  -12L since admission GI: on diet Heme: stable ID: afebrile, no leukocytosis Endo: SSI.   Dispo: continue ICU care  4/18, MD 09/26/2019 8:35  AM

## 2019-09-26 NOTE — Progress Notes (Signed)
Peripherally Inserted Central Catheter Placement  The IV Nurse has discussed with the patient and/or persons authorized to consent for the patient, the purpose of this procedure and the potential benefits and risks involved with this procedure.  The benefits include less needle sticks, lab draws from the catheter, and the patient may be discharged home with the catheter. Risks include, but not limited to, infection, bleeding, blood clot (thrombus formation), and puncture of an artery; nerve damage and irregular heartbeat and possibility to perform a PICC exchange if needed/ordered by physician.  Alternatives to this procedure were also discussed.  Bard Power PICC patient education guide, fact sheet on infection prevention and patient information card has been provided to patient /or left at bedside.    PICC Placement Documentation  PICC Double Lumen 09/26/19 PICC Right Brachial 38 cm 0 cm (Active)  Indication for Insertion or Continuance of Line Vasoactive infusions 09/26/19 1345  Exposed Catheter (cm) 0 cm 09/26/19 1345  Site Assessment Clean;Dry;Intact 09/26/19 1345  Lumen #1 Status Flushed;Saline locked;Blood return noted 09/26/19 1345  Lumen #2 Status Flushed;Saline locked;Blood return noted 09/26/19 1345  Dressing Type Transparent 09/26/19 1345  Dressing Status Clean;Dry;Intact;Antimicrobial disc in place 09/26/19 1345  Dressing Change Due 10/03/19 09/26/19 1345       Ethelda Chick 09/26/2019, 1:48 PM

## 2019-09-27 LAB — CBC WITH DIFFERENTIAL/PLATELET
Abs Immature Granulocytes: 0.19 10*3/uL — ABNORMAL HIGH (ref 0.00–0.07)
Basophils Absolute: 0 10*3/uL (ref 0.0–0.1)
Basophils Relative: 0 %
Eosinophils Absolute: 0.2 10*3/uL (ref 0.0–0.5)
Eosinophils Relative: 3 %
HCT: 27.7 % — ABNORMAL LOW (ref 39.0–52.0)
Hemoglobin: 9.2 g/dL — ABNORMAL LOW (ref 13.0–17.0)
Immature Granulocytes: 3 %
Lymphocytes Relative: 17 %
Lymphs Abs: 1.3 10*3/uL (ref 0.7–4.0)
MCH: 29.8 pg (ref 26.0–34.0)
MCHC: 33.2 g/dL (ref 30.0–36.0)
MCV: 89.6 fL (ref 80.0–100.0)
Monocytes Absolute: 0.7 10*3/uL (ref 0.1–1.0)
Monocytes Relative: 10 %
Neutro Abs: 5.2 10*3/uL (ref 1.7–7.7)
Neutrophils Relative %: 67 %
Platelets: 156 10*3/uL (ref 150–400)
RBC: 3.09 MIL/uL — ABNORMAL LOW (ref 4.22–5.81)
RDW: 14.1 % (ref 11.5–15.5)
WBC: 7.7 10*3/uL (ref 4.0–10.5)
nRBC: 0 % (ref 0.0–0.2)

## 2019-09-27 LAB — BASIC METABOLIC PANEL
Anion gap: 11 (ref 5–15)
BUN: 16 mg/dL (ref 8–23)
CO2: 23 mmol/L (ref 22–32)
Calcium: 8.1 mg/dL — ABNORMAL LOW (ref 8.9–10.3)
Chloride: 92 mmol/L — ABNORMAL LOW (ref 98–111)
Creatinine, Ser: 0.82 mg/dL (ref 0.61–1.24)
GFR calc Af Amer: >60 mL/min (ref >60)
GFR calc non Af Amer: >60 mL/min (ref >60)
Glucose, Bld: 117 mg/dL — ABNORMAL HIGH (ref 70–99)
Potassium: 3.5 mmol/L (ref 3.5–5.1)
Sodium: 126 mmol/L — ABNORMAL LOW (ref 135–145)

## 2019-09-27 LAB — GLUCOSE, CAPILLARY
Glucose-Capillary: 92 mg/dL (ref 70–99)
Glucose-Capillary: 136 mg/dL — ABNORMAL HIGH (ref 70–99)
Glucose-Capillary: 119 mg/dL — ABNORMAL HIGH (ref 70–99)
Glucose-Capillary: 114 mg/dL — ABNORMAL HIGH (ref 70–99)
Glucose-Capillary: 106 mg/dL — ABNORMAL HIGH (ref 70–99)

## 2019-09-27 LAB — COOXEMETRY PANEL
Carboxyhemoglobin: 2.2 % — ABNORMAL HIGH (ref 0.5–1.5)
Methemoglobin: 1.2 % (ref 0.0–1.5)
O2 Saturation: 61.4 %
Total hemoglobin: 9.5 g/dL — ABNORMAL LOW (ref 12.0–16.0)

## 2019-09-27 LAB — LACTATE DEHYDROGENASE: LDH: 175 U/L (ref 98–192)

## 2019-09-27 MED ORDER — SODIUM CHLORIDE 0.9 % IV SOLN: 250.0000 mL | INTRAVENOUS | Status: DC | PRN

## 2019-09-27 MED ORDER — POTASSIUM CHLORIDE CRYS ER 20 MEQ PO TBCR
40.0000 meq | EXTENDED_RELEASE_TABLET | Freq: Once | ORAL | Status: AC
  Administered 2019-09-27: 40 meq via ORAL
  Filled 2019-09-27: qty 2

## 2019-09-27 MED ORDER — ~~LOC~~ CARDIAC SURGERY, PATIENT & FAMILY EDUCATION: Freq: Once | Status: DC

## 2019-09-27 MED ORDER — LOSARTAN POTASSIUM 25 MG PO TABS
12.5000 mg | ORAL_TABLET | Freq: Every day | ORAL | Status: DC
  Administered 2019-09-27 – 2019-09-28 (×2): 12.5 mg via ORAL
  Filled 2019-09-27 (×2): qty 1

## 2019-09-27 MED ORDER — POTASSIUM CHLORIDE CRYS ER 20 MEQ PO TBCR
20.0000 meq | EXTENDED_RELEASE_TABLET | ORAL | Status: AC
  Administered 2019-09-27 (×3): 20 meq via ORAL
  Filled 2019-09-27 (×3): qty 1

## 2019-09-27 MED ORDER — SODIUM CHLORIDE 0.9% FLUSH
3.0000 mL | Freq: Two times a day (BID) | INTRAVENOUS | Status: DC
  Administered 2019-09-27 – 2019-10-01 (×5): 3 mL via INTRAVENOUS

## 2019-09-27 MED ORDER — SODIUM CHLORIDE 0.9% FLUSH: 3.0000 mL | INTRAVENOUS | Status: DC | PRN

## 2019-09-27 MED ORDER — SODIUM CHLORIDE 0.9 % IV BOLUS
500.0000 mL | Freq: Once | INTRAVENOUS | Status: AC
  Administered 2019-09-27: 500 mL via INTRAVENOUS

## 2019-09-27 NOTE — Plan of Care (Signed)
  Problem: Education: Goal: Knowledge of General Education information will improve Description: Including pain rating scale, medication(s)/side effects and non-pharmacologic comfort measures Outcome: Progressing   Problem: Health Behavior/Discharge Planning: Goal: Ability to manage health-related needs will improve Outcome: Progressing   Problem: Clinical Measurements: Goal: Ability to maintain clinical measurements within normal limits will improve Outcome: Progressing Goal: Will remain free from infection Outcome: Progressing Goal: Diagnostic test results will improve Outcome: Progressing Goal: Cardiovascular complication will be avoided Outcome: Progressing   Problem: Activity: Goal: Risk for activity intolerance will decrease Outcome: Progressing   Problem: Nutrition: Goal: Adequate nutrition will be maintained Outcome: Progressing   Problem: Coping: Goal: Level of anxiety will decrease Outcome: Progressing   Problem: Elimination: Goal: Will not experience complications related to bowel motility Outcome: Progressing Goal: Will not experience complications related to urinary retention Outcome: Progressing   Problem: Pain Managment: Goal: General experience of comfort will improve Outcome: Progressing   Problem: Safety: Goal: Ability to remain free from injury will improve Outcome: Progressing   Problem: Skin Integrity: Goal: Risk for impaired skin integrity will decrease Outcome: Progressing   Problem: Education: Goal: Will demonstrate proper wound care and an understanding of methods to prevent future damage Outcome: Progressing Goal: Knowledge of disease or condition will improve Outcome: Progressing Goal: Knowledge of the prescribed therapeutic regimen will improve Outcome: Progressing Goal: Individualized Educational Video(s) Outcome: Progressing   Problem: Activity: Goal: Risk for activity intolerance will decrease Outcome: Progressing    Problem: Cardiac: Goal: Will achieve and/or maintain hemodynamic stability Outcome: Progressing   Problem: Clinical Measurements: Goal: Postoperative complications will be avoided or minimized Outcome: Progressing   Problem: Respiratory: Goal: Respiratory status will improve Outcome: Progressing   Problem: Skin Integrity: Goal: Wound healing without signs and symptoms of infection Outcome: Progressing Goal: Risk for impaired skin integrity will decrease Outcome: Progressing   Problem: Urinary Elimination: Goal: Ability to achieve and maintain adequate renal perfusion and functioning will improve Outcome: Progressing   

## 2019-09-27 NOTE — Plan of Care (Signed)
  Problem: Elimination: Goal: Will not experience complications related to bowel motility Outcome: Progressing   Problem: Safety: Goal: Ability to remain free from injury will improve Outcome: Progressing   Problem: Cardiac: Goal: Will achieve and/or maintain hemodynamic stability Outcome: Progressing  Pt is stable in bed , no Bm since admission , ivf in progress maintained.

## 2019-09-27 NOTE — Progress Notes (Signed)
      [address]       [place] [address]             [phone]                 8 Days Post-Op Procedure(s) (LRB): CORONARY ARTERY BYPASS GRAFTING (CABG) using LIMA to LAD; Endoscopic harvest right saphenous vein: SVG tp Diag; SVG sequential to OM1 and OM2. (N/A) TRANSESOPHAGEAL ECHOCARDIOGRAM (TEE) (N/A) Endovein Harvest Of Greater Saphenous Vein (Right) CLOSURE OF PATENT DUCTUS ARTERIOSUS (N/A)   Events: No events overnight  _______________________________________________________________ Vitals: BP 111/64   Pulse 85   Temp 97.6 F (36.4 C) (Oral)   Resp (!) 28   Ht 5' 5.5" (1.664 m)   Wt 69.1 kg   SpO2 98%   BMI 24.96 kg/m   - Neuro: alert NAD  - Cardiovascular: sinus  Drips: milr 0.25.   CVP:  [1 mmHg-2 mmHg] 2 mmHg  - Pulm: clear, EWOB    ABG    Component Value Date/Time   PHART 7.469 (H) 09/20/2019 1944   PCO2ART 31.8 (L) 09/20/2019 1944   PO2ART 123.0 (H) 09/20/2019 1944   HCO3 23.2 09/20/2019 1944   TCO2 24 09/20/2019 1944   ACIDBASEDEF 2.0 09/20/2019 1814   O2SAT 61.4 09/27/2019 0429    - Abd: soft - Extremity: warm  .Intake/Output      03/20 0701 - 03/21 0700 03/21 0701 - 03/22 0700   P.O.  200   I.V. (mL/kg) 134.8 (2) 11.1 (0.2)   IV Piggyback 500.2    Total Intake(mL/kg) 635 (9.2) 211.1 (3.1)   Urine (mL/kg/hr) 1162 (0.7)    Total Output 1162    Net -527 +211.1           _______________________________________________________________ Labs: CBC Latest Ref Rng & Units 09/27/2019 09/26/2019 09/25/2019  WBC 4.0 - 10.5 K/uL 7.7 6.7 5.2  Hemoglobin 13.0 - 17.0 g/dL 8.4(Y) 6.5(L) 9.3(T)  Hematocrit 39.0 - 52.0 % 27.7(L) 28.5(L) 28.2(L)  Platelets 150 - 400 K/uL 156 124(L) 83(L)   CMP Latest Ref Rng & Units 09/27/2019 09/26/2019 09/25/2019  Glucose 70 - 99 mg/dL 701(X) 793(J) 030(S)  BUN 8 - 23 mg/dL 16 19 20   Creatinine 0.61 - 1.24 mg/dL 9.23 3.00  Sodium 135 - 145 mmol/L 126(L) 128(L) 127(L)  Potassium 3.5 - 5.1  mmol/L 3.5 3.4(L) 3.7  Chloride 98 - 111 mmol/L 92(L) 92(L) 88(L)  CO2 22 - 32 mmol/L 23 26 26   Calcium 8.9 - 10.3 mg/dL 8.1(L) 8.4(L) 8.5(L)  Total Protein 6.5 - 8.1 g/dL - - -  Total Bilirubin 0.3 - 1.2 mg/dL - - -  Alkaline Phos 38 - 126 U/L - - -  AST 15 - 41 U/L - - -  ALT 0 - 44 U/L - - -    CXR: pending  _______________________________________________________________  Assessment and Plan: POD 8 s/p CABG, impella placement (removed on 3/18) Doing well   Neuro: pain controlled CV: good blood pressure, remains on milr.  Will keep for now.  Pulm: continue pulm toilet Renal: good uop.  -12L since admission GI: on diet Heme: stable ID: afebrile, no leukocytosis Endo: SSI.   Dispo: continue ICU care, floor soon  , MD 09/27/2019 9:56 AM

## 2019-09-27 NOTE — Progress Notes (Signed)
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  - Impella removed 3/18  Remains on milrinone 0.25 mcg. Given IVF for low CVP yesterday. CVP still 1-2. Co-ox 61% today    Feels better. No CP or SOB. Dizziness improved. Eating well. No BM yet.  Na 128-> 126   Objective:   Weight Range:  Vital Signs:   Temp:  [96.8 F (36 C)-98.5 F (36.9 C)] 97.6 F (36.4 C) (03/21 0805) Pulse Rate:  [72-89] 85 (03/21 0900) Resp:  [18-38] 28 (03/21 0900) BP: (91-124)/(52-83) 111/64 (03/21 0900) SpO2:  [97 %-100 %] 98 % (03/21 0900) Weight:  [69.1 kg] 69.1 kg (03/21 0600) Last BM Date: (pta)  Weight change: Filed Weights   09/25/19 0600 09/26/19 0500 09/27/19 0600  Weight: 70.1 kg 69 kg 69.1 kg    Intake/Output:   Intake/Output Summary (Last 24 hours) at 09/27/2019 1030 Last data filed at 09/27/2019 0900 Gross per 24 hour  Intake 846.04 ml  Output 1082 ml  Net -235.96 ml     Physical Exam: General: sitting up in chair  No resp difficulty HEENT: normal Neck: supple. no JVD. Carotids 2+ bilat; no bruits. No lymphadenopathy or thryomegaly appreciated. Cor: sternal wound ok PMI nondisplaced. Regular rate & rhythm. No rubs, gallops or murmurs. Lungs: clear dull at bases Abdomen: soft, nontender, + distended. No hepatosplenomegaly. No bruits or masses. Good bowel sounds. Extremities: no cyanosis, clubbing, rash, edema Neuro: alert & orientedx3, cranial nerves grossly intact. moves all 4 extremities w/o difficulty. Affect pleasant    Telemetry: Sinus 80s Personally reviewed   Labs: Basic Metabolic Panel: Recent Labs  Lab 09/20/19 1614 09/20/19 1814 09/23/19 0437 09/23/19 0437 09/23/19 1455 09/23/19 1455 09/24/19 0354 09/24/19 0620 09/24/19 0620 09/25/19 0421 09/26/19 1041 09/27/19 0429  NA 132*   < > 132*   < >  126*  --   --  130*  --  127* 128* 126*  K 3.9   < > 3.7   < > 3.6  --   --  3.9  --  3.7 3.4* 3.5  CL 104   < > 91*   < > 87*  --   --  87*  --  88* 92* 92*  CO2 20*   < > 27   < > 29  --   --  30  --  '26 26 23  '$ GLUCOSE 117*   < > 108*   < > 124*  --   --  116*  --  103* 111* 117*  BUN 21   < > 24*   < > 24*  --   --  21  --  '20 19 16  '$ CREATININE 1.12   < > 1.17   < > 1.21  --   --  1.25*  --  0.98 0.86 0.82  CALCIUM 7.6*   < > 8.4*   < > 8.1*   < >  --  8.8*   < > 8.5* 8.4* 8.1*  MG 2.0  --  1.8  --   --   --  1.9  --   --   --   --   --    < > = values in this interval not displayed.    Liver Function Tests: No results for input(s): AST,  ALT, ALKPHOS, BILITOT, PROT, ALBUMIN in the last 168 hours. No results for input(s): LIPASE, AMYLASE in the last 168 hours. No results for input(s): AMMONIA in the last 168 hours.  CBC: Recent Labs  Lab 09/23/19 0437 09/23/19 0437 09/24/19 0354 09/24/19 1727 09/25/19 0421 09/26/19 0402 09/27/19 0429  WBC 7.3   < > 8.0 5.9 5.2 6.7 7.7  NEUTROABS 5.2  --  5.6  --  3.1 4.2 5.2  HGB 8.5*   < > 10.0* 9.6* 9.6* 9.8* 9.2*  HCT 25.2*   < > 29.7* 28.4* 28.2* 28.5* 27.7*  MCV 91.3   < > 90.8 89.3 90.7 87.7 89.6  PLT 48*   < > 63* 67* 83* 124* 156   < > = values in this interval not displayed.    Cardiac Enzymes: No results for input(s): CKTOTAL, CKMB, CKMBINDEX, TROPONINI in the last 168 hours.  BNP: BNP (last 3 results) Recent Labs    09/18/19 1300  BNP 1,784.5*    ProBNP (last 3 results) No results for input(s): PROBNP in the last 8760 hours.    Other results:  Imaging: [place] EKG SITE RITE  Result Date: 09/25/2019 If Site Rite image not attached, placement could not be confirmed due to current cardiac rhythm.    Medications:     Scheduled Medications: . amiodarone  200 mg Oral BID  . aspirin EC  325 mg Oral Daily   Or  . aspirin  324 mg Per Tube Daily  . bisacodyl  10 mg Oral Daily   Or  . bisacodyl  10 mg Rectal Daily    . chlorhexidine gluconate (MEDLINE KIT)  15 mL Mouth Rinse BID  . Chlorhexidine Gluconate Cloth  6 each Topical Daily  . colchicine  0.3 mg Oral BID  . [facility], Patient [facility] Family Education   Does not apply Once  . digoxin  0.125 mg Oral Daily  . docusate sodium  200 mg Oral Daily  . insulin aspart  0-24 Units Subcutaneous Q4H  . pantoprazole  40 mg Oral Daily  . potassium chloride  20 mEq Oral Q4H  . sodium chloride flush  10-40 mL Intracatheter Q12H  . sodium chloride flush  3 mL Intravenous Q12H  . sorbitol  30 mL Oral Once  . spironolactone  12.5 mg Oral Daily    Infusions:  . sodium chloride 10 mL/hr at 09/20/19 1900  . sodium chloride    . milrinone 0.25 mcg/kg/min (09/27/19 0700)    PRN Medications: sodium chloride, acetaminophen, ALPRAZolam, lip balm, midazolam, morphine injection, ondansetron (ZOFRAN) IV, oxyCODONE, sodium chloride flush, sodium chloride flush, sodium chloride flush, traMADol, zolpidem   Assessment/Plan:   1. Acute systolic HF ->Cardiogenic shock - 09/18/19: Echo EF 20% RV ok - 09/18/19: Cath 3v CAD with 99% LM. Required Impella support -  Impella out 3/18. Swan out 3/19 -  On milrinone 0.25 mcg. Co-ox 62% CVP 1-2. Still dry - Can decrease milrinone to 0.125. Follow CVP and co-ox - Give another 500cc back - No diuretics - Continue digoxin 0.125 mg daily.  - Continue 12.5 mg spironolactone daily.  - Add losartan 12.5 daily   2. 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 Acute hypoxic respiratory failure - Extubated 3/14. Sats stable on room air.  - No change. Encourage IS  4. Expected post-op blood loss - hgb 9.2  . Transfuse as needed to keep > 7.5  5, Hyponatremia: - Na 128 -> 126. Limit free h20 - check BMET today  6. Constipation - sorbitol  Can go to SDU     Length of Stay:  9   Ameenah Prosser MD 09/27/2019, 10:30 AM  Advanced Heart Failure Team Pager [phone] (M-F; [place])  Please contact [facility] for night-coverage after hours (4p -7a ) and weekends on [email or web address]

## 2019-09-28 ENCOUNTER — Inpatient Hospital Stay (HOSPITAL_COMMUNITY): Payer: Medicare Other

## 2019-09-28 LAB — CBC WITH DIFFERENTIAL/PLATELET
Abs Immature Granulocytes: 0.15 10*3/uL — ABNORMAL HIGH (ref 0.00–0.07)
Basophils Absolute: 0 10*3/uL (ref 0.0–0.1)
Basophils Relative: 0 %
Eosinophils Absolute: 0.2 10*3/uL (ref 0.0–0.5)
Eosinophils Relative: 2 %
HCT: 28.1 % — ABNORMAL LOW (ref 39.0–52.0)
Hemoglobin: 9.2 g/dL — ABNORMAL LOW (ref 13.0–17.0)
Immature Granulocytes: 2 %
Lymphocytes Relative: 16 %
Lymphs Abs: 1.4 10*3/uL (ref 0.7–4.0)
MCH: 29.7 pg (ref 26.0–34.0)
MCHC: 32.7 g/dL (ref 30.0–36.0)
MCV: 90.6 fL (ref 80.0–100.0)
Monocytes Absolute: 0.7 10*3/uL (ref 0.1–1.0)
Monocytes Relative: 8 %
Neutro Abs: 6.3 10*3/uL (ref 1.7–7.7)
Neutrophils Relative %: 72 %
Platelets: 216 10*3/uL (ref 150–400)
RBC: 3.1 MIL/uL — ABNORMAL LOW (ref 4.22–5.81)
RDW: 14.3 % (ref 11.5–15.5)
WBC: 8.8 10*3/uL (ref 4.0–10.5)
nRBC: 0 % (ref 0.0–0.2)

## 2019-09-28 LAB — GLUCOSE, CAPILLARY
Glucose-Capillary: 101 mg/dL — ABNORMAL HIGH (ref 70–99)
Glucose-Capillary: 103 mg/dL — ABNORMAL HIGH (ref 70–99)
Glucose-Capillary: 121 mg/dL — ABNORMAL HIGH (ref 70–99)
Glucose-Capillary: 98 mg/dL (ref 70–99)

## 2019-09-28 LAB — BASIC METABOLIC PANEL
Anion gap: 10 (ref 5–15)
BUN: 17 mg/dL (ref 8–23)
CO2: 21 mmol/L — ABNORMAL LOW (ref 22–32)
Calcium: 8.2 mg/dL — ABNORMAL LOW (ref 8.9–10.3)
Chloride: 98 mmol/L (ref 98–111)
Creatinine, Ser: 0.84 mg/dL (ref 0.61–1.24)
GFR calc Af Amer: >60 mL/min (ref >60)
GFR calc non Af Amer: >60 mL/min (ref >60)
Glucose, Bld: 98 mg/dL (ref 70–99)
Potassium: 4 mmol/L (ref 3.5–5.1)
Sodium: 129 mmol/L — ABNORMAL LOW (ref 135–145)

## 2019-09-28 LAB — COOXEMETRY PANEL
Carboxyhemoglobin: 3.6 % — ABNORMAL HIGH (ref 0.5–1.5)
Methemoglobin: 0 % (ref 0.0–1.5)
O2 Saturation: 58 %
Total hemoglobin: 9.7 g/dL — ABNORMAL LOW (ref 12.0–16.0)

## 2019-09-28 LAB — LACTATE DEHYDROGENASE: LDH: 168 U/L (ref 98–192)

## 2019-09-28 NOTE — Progress Notes (Addendum)
      [address]       [place] [address]             [phone]        9 Days Post-Op Procedure(s) (LRB): CORONARY ARTERY BYPASS GRAFTING (CABG) using LIMA to LAD; Endoscopic harvest right saphenous vein: SVG tp Diag; SVG sequential to OM1 and OM2. (N/A) TRANSESOPHAGEAL ECHOCARDIOGRAM (TEE) (N/A) Endovein Harvest Of Greater Saphenous Vein (Right) CLOSURE OF PATENT DUCTUS ARTERIOSUS (N/A)  Subjective: Patient still overall feels weak  Objective: Vital signs in last 24 hours: Temp:  [97.5 F (36.4 C)-98.5 F (36.9 C)] 97.9 F (36.6 C) (03/22 0300) Pulse Rate:  [78-85] 80 (03/22 0300) Cardiac Rhythm: Heart block;Bundle branch block (03/22 0700) Resp:  [20-31] 23 (03/22 0300) BP: (98-122)/(55-72) 101/55 (03/22 0300) SpO2:  [96 %-100 %] 96 % (03/22 0300) Weight:  [69.4 kg] 69.4 kg (03/22 0300)  Pre op weight 73.9 kg Current Weight  09/28/19 69.4 kg    Hemodynamic parameters for last 24 hours: CVP:  [3 mmHg] 3 mmHg  Intake/Output from previous day: 03/21 0701 - 03/22 0700 In: 1083.4 [P.O.:500; I.V.:83.4; IV Piggyback:500] Out: 200 [Urine:200]   Physical Exam:  Cardiovascular: RRR Pulmonary: Slightly diminished bibasilar breath sounds Abdomen: Soft, non tender, bowel sounds present. Extremities: Trace bilateral lower extremity edema. Wounds: Clean and dry.  No erythema or signs of infection.  Lab Results: CBC: Recent Labs    09/27/19 0429 09/28/19 0404  WBC 7.7 8.8  HGB 9.2* 9.2*  HCT 27.7* 28.1*  PLT 156 216   BMET:  Recent Labs    09/27/19 0429 09/28/19 0404  NA 126* 129*  K 3.5 4.0  CL 92* 98  CO2 23 21*  GLUCOSE 117* 98  BUN 16 17  CREATININE 0.82 0.84  CALCIUM 8.1* 8.2*    PT/INR:  Lab Results  Component Value Date   INR 1.6 (H) 09/20/2019   INR 1.7 (H) 09/19/2019   INR 1.2 09/19/2019   ABG:  INR: Will add last result for INR, ABG once components are confirmed Will add last 4 CBG results once components are  confirmed  Assessment/Plan:  1. CV - SR with HR in the 80's. On Amiodarone 200 mg bid, Digoxin 0.125 mg daily, Losartan 12.5 mg daily.On Milrinone drip and co ox this am 58. Heart failure following. 2.  Pulmonary - On room air. Check PA/LAT CXR in am. Encourage incentive spirometer 3. Acute systolic heart failure - On Spironolactone 12.5 mg daily 4.  Expected post op blood loss anemia - H and H this am stable at 9.2 and 28.1 5.  CBGs 106/103/98.  Pre op HGA1C 4.9. Patient states "I don't have diabetes". Will stop accu checks and SS PRN 6. Hyponatremia-sodium slightly increased to 129 this am 7. Remove EPW 8. Patient lives alone so will either need CIR vs SNF  Lamond Glantz M ZimmermanPA-C 09/28/2019,7:26 AM

## 2019-09-28 NOTE — Progress Notes (Signed)
      [address]       [place] [address]             [phone]       Epicardial pacing wires were removed this morning without issue. Tips intact. Patient tolerated well. Instructed to lay flat for an hour. Patient understands.    Jari Favre, PA-C

## 2019-09-28 NOTE — Plan of Care (Signed)
  Problem: Education: Goal: Knowledge of General Education information will improve Description: Including pain rating scale, medication(s)/side effects and non-pharmacologic comfort measures Outcome: Progressing   Problem: Health Behavior/Discharge Planning: Goal: Ability to manage health-related needs will improve Outcome: Progressing   Problem: Clinical Measurements: Goal: Ability to maintain clinical measurements within normal limits will improve Outcome: Progressing Goal: Will remain free from infection Outcome: Progressing Goal: Diagnostic test results will improve Outcome: Progressing Goal: Cardiovascular complication will be avoided Outcome: Progressing   Problem: Activity: Goal: Risk for activity intolerance will decrease Outcome: Progressing   Problem: Nutrition: Goal: Adequate nutrition will be maintained Outcome: Progressing   Problem: Coping: Goal: Level of anxiety will decrease Outcome: Progressing   Problem: Elimination: Goal: Will not experience complications related to bowel motility Outcome: Progressing Goal: Will not experience complications related to urinary retention Outcome: Progressing   Problem: Pain Managment: Goal: General experience of comfort will improve Outcome: Progressing   Problem: Safety: Goal: Ability to remain free from injury will improve Outcome: Progressing   Problem: Skin Integrity: Goal: Risk for impaired skin integrity will decrease Outcome: Progressing   Problem: Education: Goal: Will demonstrate proper wound care and an understanding of methods to prevent future damage Outcome: Progressing Goal: Knowledge of disease or condition will improve Outcome: Progressing Goal: Knowledge of the prescribed therapeutic regimen will improve Outcome: Progressing Goal: Individualized Educational Video(s) Outcome: Progressing   Problem: Activity: Goal: Risk for activity intolerance will decrease Outcome: Progressing    Problem: Cardiac: Goal: Will achieve and/or maintain hemodynamic stability Outcome: Progressing   Problem: Clinical Measurements: Goal: Postoperative complications will be avoided or minimized Outcome: Progressing   Problem: Respiratory: Goal: Respiratory status will improve Outcome: Progressing   Problem: Skin Integrity: Goal: Wound healing without signs and symptoms of infection Outcome: Progressing Goal: Risk for impaired skin integrity will decrease Outcome: Progressing   Problem: Urinary Elimination: Goal: Ability to achieve and maintain adequate renal perfusion and functioning will improve Outcome: Progressing   

## 2019-09-28 NOTE — Progress Notes (Addendum)
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  - Impella removed 3/18  Remains on milrinone, rate reduced to 0.125 mcg yesterday. Co-ox marginal at 58%.   CVP 3-4  Na 128-> 126->129   Pacing wires pulled today. Resting in bed. No complaints. Had BM last night and again today after sorbitol.   Objective:   Weight Range:  Vital Signs:   Temp:  [97.5 F (36.4 C)-98.5 F (36.9 C)] 97.8 F (36.6 C) (03/22 0755) Pulse Rate:  [78-84] 83 (03/22 0755) Resp:  [20-31] 25 (03/22 0755) BP: (98-122)/(55-72) 102/68 (03/22 0755) SpO2:  [96 %-100 %] 97 % (03/22 0755) Weight:  [69.4 kg] 69.4 kg (03/22 0300) Last BM Date: 09/28/19  Weight change: Filed Weights   09/26/19 0500 09/27/19 0600 09/28/19 0300  Weight: 69 kg 69.1 kg 69.4 kg    Intake/Output:   Intake/Output Summary (Last 24 hours) at 09/28/2019 0907 Last data filed at 09/28/2019 0540 Gross per 24 hour  Intake 872.36 ml  Output 200 ml  Net 672.36 ml    PHYSICAL EXAM: CVP 3-4  General:  Sitting up in bed . No respiratory difficulty HEENT: normal anicteric Neck: supple. no JVD. Carotids 2+ bilat; no bruits. No lymphadenopathy or thyromegaly appreciated.  Cor: PMI nondisplaced. Regular rate & rhythm. No rubs, gallops or murmurs. Sternotomy site stable  Lungs: clear no wheeze  Abdomen: soft, nontender, nondistended. No hepatosplenomegaly. No bruits or masses. Good bowel sounds. Extremities: no cyanosis, clubbing, rash, edema + RUE PICC, SVG harvest site stable  Neuro: alert & oriented x 3, cranial nerves grossly intact. moves all 4 extremities w/o difficulty. Affect pleasant    Telemetry: NSR mid 80s, no arrhthymias  Personally reviewed   Labs: Basic Metabolic Panel: Recent Labs  Lab 09/23/19 0437 09/23/19 1455 09/24/19 0354 09/24/19 0620 09/24/19 0620  09/25/19 0421 09/25/19 0421 09/26/19 1041 09/27/19 0429 09/28/19 0404  NA 132*   < >  --  130*  --  127*  --  128* 126* 129*  K 3.7   < >  --  3.9  --  3.7  --  3.4* 3.5 4.0  CL 91*   < >  --  87*  --  88*  --  92* 92* 98  CO2 27   < >  --  30  --  26  --  26 23 21*  GLUCOSE 108*   < >  --  116*  --  103*  --  111* 117* 98  BUN 24*   < >  --  21  --  20  --  '19 16 17  '$ CREATININE 1.17   < >  --  1.25*  --  0.98  --  0.86 0.82 0.84  CALCIUM 8.4*   < >  --  8.8*   < > 8.5*   < > 8.4* 8.1* 8.2*  MG 1.8  --  1.9  --   --   --   --   --   --   --    < > = values in this interval not displayed.    Liver Function Tests: No results for input(s): AST, ALT, ALKPHOS, BILITOT, PROT, ALBUMIN in the last 168 hours. No results for input(s): LIPASE, AMYLASE in the last 168 hours. No results for input(s):  AMMONIA in the last 168 hours.  CBC: Recent Labs  Lab 09/24/19 0354 09/24/19 0354 09/24/19 1727 09/25/19 0421 09/26/19 0402 09/27/19 0429 09/28/19 0404  WBC 8.0   < > 5.9 5.2 6.7 7.7 8.8  NEUTROABS 5.6  --   --  3.1 4.2 5.2 6.3  HGB 10.0*   < > 9.6* 9.6* 9.8* 9.2* 9.2*  HCT 29.7*   < > 28.4* 28.2* 28.5* 27.7* 28.1*  MCV 90.8   < > 89.3 90.7 87.7 89.6 90.6  PLT 63*   < > 67* 83* 124* 156 216   < > = values in this interval not displayed.    Cardiac Enzymes: No results for input(s): CKTOTAL, CKMB, CKMBINDEX, TROPONINI in the last 168 hours.  BNP: BNP (last 3 results) Recent Labs    09/18/19 1300  BNP 1,784.5*    ProBNP (last 3 results) No results for input(s): PROBNP in the last 8760 hours.    Other results:  Imaging: No results found.   Medications:     Scheduled Medications: . amiodarone  200 mg Oral BID  . aspirin EC  325 mg Oral Daily   Or  . aspirin  324 mg Per Tube Daily  . bisacodyl  10 mg Oral Daily   Or  . bisacodyl  10 mg Rectal Daily  . chlorhexidine gluconate (MEDLINE KIT)  15 mL Mouth Rinse BID  . Chlorhexidine Gluconate Cloth  6 each Topical  Daily  . colchicine  0.3 mg Oral BID  . [facility], Patient [facility] Family Education   Does not apply Once  . digoxin  0.125 mg Oral Daily  . docusate sodium  200 mg Oral Daily  . losartan  12.5 mg Oral Daily  . pantoprazole  40 mg Oral Daily  . sodium chloride flush  10-40 mL Intracatheter Q12H  . sodium chloride flush  3 mL Intravenous Q12H  . sorbitol  30 mL Oral Once  . spironolactone  12.5 mg Oral Daily    Infusions:  . sodium chloride 10 mL/hr at 09/20/19 1900  . sodium chloride    . milrinone 0.125 mcg/kg/min (09/28/19 0540)    PRN Medications: sodium chloride, acetaminophen, ALPRAZolam, lip balm, midazolam, morphine injection, ondansetron (ZOFRAN) IV, oxyCODONE, sodium chloride flush, sodium chloride flush, sodium chloride flush, traMADol, zolpidem   Assessment/Plan:   1. Acute systolic HF ->Cardiogenic shock - 09/18/19: Echo EF 20% RV ok - 09/18/19: Cath 3v CAD with 99% LM. Required Impella support - Impella out 3/18. Swan out 3/19 - On milrinone 0.125 mcg. Co-ox 58% CVP 3-4 - Continue milrinone to 0.125 today. Wean tomorrow if co-ox stable.  - No diuretics - Continue digoxin 0.125 mg daily.  - Continue 12.5 mg spironolactone daily.  - Continue losartan 12.5 daily   2. 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 Acute hypoxic respiratory failure - Extubated 3/14. Sats stable on room air.  - No change. Encourage IS   4. Expected post-op blood loss - hgb stable at 9.2 Transfuse as needed to keep > 7.5  5, Hyponatremia: - Na 128 -> 126->129 . Limit free h20  6. Constipation - resolved w/ sorbitol    Length of Stay: [place] PA-C 09/28/2019, 9:07 AM  Advanced Heart Failure Team Pager [phone] (M-F; 7a - 4p)  Please  contact [facility] for night-coverage after hours (4p -7a ) and weekends on  [email or web address]  Patient seen and examined with the above-signed Advanced Practice Provider and/or Housestaff. I personally reviewed laboratory data, imaging studies and relevant notes. I independently examined the patient and formulated the important aspects of the plan. I have edited the note to reflect any of my changes or salient points. I have personally discussed the plan with the patient and/or family.  Remains on milrinone 0.125.  Coox marginal at 58% volume status looks good.  He feels weak but is improving.  Was able to walk with cardiac rehab.  CVP today checked personally 4-5.  He is on spironolactone and low-dose losartan as well as digoxin.  Containing normal sinus rhythm.  Serum sodium slightly improved at 129.  No symptoms.    We will continue milrinone today.  If coox is stable tomorrow we will discontinue and follow.  Continue rehab.  Continue to titrate heart failure medications.  Possibly home Thursday if we can wean milrinone successfully.  Glori Bickers, MD  1:44 PM

## 2019-09-28 NOTE — Progress Notes (Signed)
Physical Therapy Treatment Patient Details Name: Dennis Roberts MRN: [id number] DOB: 1945/12/12 Today's Date: 09/28/2019    History of Present Illness Pt adm with acute systolic heart failure and cariogenic shock. Impella placed 3/12. 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 Impella removed 3/18. PMH - none.    PT Comments    Pt making good progress as expected with mobility.    Follow Up Recommendations  No PT follow up     Equipment Recommendations  Other (comment)(To be determined)    Recommendations for Other Services       Precautions / Restrictions Precautions Precautions: Sternal Restrictions Weight Bearing Restrictions: Yes(sternal precautions)    Mobility  Bed Mobility Overal bed mobility: Needs Assistance Bed Mobility: Supine to Sit;Sit to Supine     Supine to sit: HOB elevated;Supervision Sit to supine: Min assist   General bed mobility comments: Assist to bring feet back up into bed  Transfers Overall transfer level: Needs assistance Equipment used: 4-wheeled walker Transfers: Sit to/from Stand Sit to Stand: Min guard         General transfer comment: Assist for lines/safety  Ambulation/Gait Ambulation/Gait assistance: Min guard Gait Distance (Feet): 260 Feet Assistive device: 4-wheeled walker Gait Pattern/deviations: Step-through pattern;Decreased stride length Gait velocity: decr Gait velocity interpretation: 1.31 - 2.62 ft/sec, indicative of limited community ambulator General Gait Details: Assist for lines/safety   Stairs             Wheelchair Mobility    Modified Rankin (Stroke Patients Only)       Balance Overall balance assessment: Mild deficits observed, not formally tested                                          Cognition Arousal/Alertness: Awake/alert Behavior During Therapy: WFL for tasks assessed/performed Overall Cognitive Status: Within Functional Limits for tasks assessed                                         Exercises      General Comments General comments (skin integrity, edema, etc.): VSS. Pt amb on RA      Pertinent Vitals/Pain Pain Assessment: No/denies pain    Home Living                      Prior Function            PT Goals (current goals can now be found in the care plan section) Acute Rehab PT Goals Patient Stated Goal: return home Progress towards PT goals: Progressing toward goals    Frequency    Min 3X/week      PT Plan Current plan remains appropriate    Co-evaluation              AM-PAC PT "6 Clicks" Mobility   Outcome Measure  Help needed turning from your back to your side while in a flat bed without using bedrails?: A Little Help needed moving from lying on your back to sitting on the side of a flat bed without using bedrails?: A Little Help needed moving to and from a bed to a chair (including a wheelchair)?: A Little Help needed standing up from a chair using your arms (e.g., wheelchair or bedside chair)?: A Little Help needed to walk in hospital  room?: A Little Help needed climbing 3-5 steps with a railing? : A Little 6 Click Score: 18    End of Session   Activity Tolerance: Patient tolerated treatment well Patient left: in bed;with call bell/phone within reach;with nursing/sitter in room Nurse Communication: Mobility status PT Visit Diagnosis: Other abnormalities of gait and mobility (R26.89)     Time: 4492-0100 PT Time Calculation (min) (ACUTE ONLY): 15 min  Charges:  $Gait Training: 8-22 mins                     [place] Pager [phone] Office [place] 09/28/2019, 3:02 PM

## 2019-09-28 NOTE — Progress Notes (Signed)
[id number] Came to see pt to walk.  Came an hour earlier and notified by RN that pt wants to sleep right now. Came back now and pt stated he is tired from going to bathroom so many times after laxatives. Pt encouraged to walk with staff later when feeling up to it. Did walk with PT earlier. Encouraged IS. Pt stated he is getting it to 1000 ml. Will follow up tomorrow. Luetta Nutting RN BSN 09/28/2019 2:24 PM

## 2019-09-29 ENCOUNTER — Inpatient Hospital Stay (HOSPITAL_COMMUNITY): Payer: Medicare Other

## 2019-09-29 LAB — BASIC METABOLIC PANEL
Anion gap: 7 (ref 5–15)
BUN: 16 mg/dL (ref 8–23)
CO2: 22 mmol/L (ref 22–32)
Calcium: 8.2 mg/dL — ABNORMAL LOW (ref 8.9–10.3)
Chloride: 100 mmol/L (ref 98–111)
Creatinine, Ser: 0.75 mg/dL (ref 0.61–1.24)
GFR calc Af Amer: >60 mL/min (ref >60)
GFR calc non Af Amer: >60 mL/min (ref >60)
Glucose, Bld: 90 mg/dL (ref 70–99)
Potassium: 3.8 mmol/L (ref 3.5–5.1)
Sodium: 129 mmol/L — ABNORMAL LOW (ref 135–145)

## 2019-09-29 LAB — COOXEMETRY PANEL
Carboxyhemoglobin: 2.4 % — ABNORMAL HIGH (ref 0.5–1.5)
Methemoglobin: 1.1 % (ref 0.0–1.5)
O2 Saturation: 74.5 %
Total hemoglobin: 9.8 g/dL — ABNORMAL LOW (ref 12.0–16.0)

## 2019-09-29 LAB — LACTATE DEHYDROGENASE: LDH: 147 U/L (ref 98–192)

## 2019-09-29 MED ORDER — LOSARTAN POTASSIUM 25 MG PO TABS
12.5000 mg | ORAL_TABLET | Freq: Every day | ORAL | Status: DC
  Administered 2019-09-29 – 2019-09-30 (×2): 12.5 mg via ORAL
  Filled 2019-09-29 (×2): qty 1

## 2019-09-29 MED ORDER — SPIRONOLACTONE 12.5 MG HALF TABLET
12.5000 mg | ORAL_TABLET | Freq: Every day | ORAL | Status: DC
  Administered 2019-09-29: 12.5 mg via ORAL
  Filled 2019-09-29: qty 1

## 2019-09-29 MED ORDER — POTASSIUM CHLORIDE CRYS ER 20 MEQ PO TBCR
20.0000 meq | EXTENDED_RELEASE_TABLET | Freq: Once | ORAL | Status: AC
  Administered 2019-09-29: 20 meq via ORAL
  Filled 2019-09-29: qty 1

## 2019-09-29 NOTE — Progress Notes (Signed)
Physical Therapy Treatment Patient Details Name: Dennis Roberts MRN: [id number] DOB: Jan 02, 1946 Today's Date: 09/29/2019    History of Present Illness Pt adm with acute systolic heart failure and cariogenic shock. Impella placed 3/12. 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 Impella removed 3/18. PMH - none.    PT Comments    Pt continues to make good progress. Reports feeling a little light headed sitting in chair and amb but able to amb the unit. See vitals flow sheet for sitting and standing BP's. Pt reports typically his BP runs mid 120's/mid 80's. Currently BP's ~100/high 50's. BP without significant change from sitting to standing.   Follow Up Recommendations  No PT follow up     Equipment Recommendations  None recommended by PT(Pt has rollator available at home)    Recommendations for Other Services       Precautions / Restrictions Precautions Precautions: Sternal Precaution Booklet Issued: No Precaution Comments: Pt able to state no pushing/pulling. Use of heart pillow for sit to stands Restrictions Weight Bearing Restrictions: Yes(sternal)    Mobility  Bed Mobility Overal bed mobility: Needs Assistance             General bed mobility comments: Pt up in chair  Transfers Overall transfer level: Needs assistance Equipment used: 4-wheeled walker Transfers: Sit to/from Stand Sit to Stand: Supervision         General transfer comment: Assist for lines/safety  Ambulation/Gait Ambulation/Gait assistance: Supervision Gait Distance (Feet): 375 Feet Assistive device: 4-wheeled walker Gait Pattern/deviations: Step-through pattern;Decreased stride length Gait velocity: decr Gait velocity interpretation: 1.31 - 2.62 ft/sec, indicative of limited community ambulator General Gait Details: Assist for lines/safety   Stairs             Wheelchair Mobility    Modified Rankin (Stroke Patients Only)       Balance Overall balance assessment: Mild deficits observed,  not formally tested                                          Cognition Arousal/Alertness: Awake/alert Behavior During Therapy: WFL for tasks assessed/performed Overall Cognitive Status: Within Functional Limits for tasks assessed                                        Exercises      General Comments General comments (skin integrity, edema, etc.): VSS. See Vitals flow sheet for BP's      Pertinent Vitals/Pain Pain Assessment: No/denies pain    Home Living Family/patient expects to be discharged to:: Private residence Living Arrangements: Alone Available Help at Discharge: Family;Available 24 hours/day Type of Home: House Home Access: Stairs to enter Entrance Stairs-Rails: Right Home Layout: One level Home Equipment: None      Prior Function Level of Independence: Independent          PT Goals (current goals can now be found in the care plan section) Acute Rehab PT Goals Patient Stated Goal: return home Progress towards PT goals: Progressing toward goals    Frequency    Min 3X/week      PT Plan Current plan remains appropriate    Co-evaluation              AM-PAC PT "6 Clicks" Mobility   Outcome Measure  Help needed turning from  your back to your side while in a flat bed without using bedrails?: A Little Help needed moving from lying on your back to sitting on the side of a flat bed without using bedrails?: A Little Help needed moving to and from a bed to a chair (including a wheelchair)?: A Little Help needed standing up from a chair using your arms (e.g., wheelchair or bedside chair)?: None Help needed to walk in hospital room?: A Little Help needed climbing 3-5 steps with a railing? : A Little 6 Click Score: 19    End of Session   Activity Tolerance: Patient tolerated treatment well Patient left: with call bell/phone within reach;in chair Nurse Communication: Mobility status PT Visit Diagnosis: Other  abnormalities of gait and mobility (R26.89)     Time: 1027-2536 PT Time Calculation (min) (ACUTE ONLY): 19 min  Charges:  $Gait Training: 8-22 mins                     [place] Pager [phone] Office [place] 09/29/2019, 1:41 PM

## 2019-09-29 NOTE — Progress Notes (Signed)
CARDIAC REHAB PHASE I   PRE:  Rate/Rhythm: 84 SR    BP: sitting 101/56    SaO2: 97 RA  MODE:  Ambulation: 240 ft   POST:  Rate/Rhythm: 93 SR    BP: sitting 117/63     SaO2: 98 RA  Pt moved to EOB independently following sternal precautions. Stood by rocking, used rollator in hall. Steady although sts he felt lightheaded most of walk. Limited by fatigue with distance but overall doing well. To recliner. Encouraged IS and x2 more walks today (PT x1 and RN x1). He has a rollator at home.  [id number]   Harriet Masson CES, ACSM 09/29/2019 8:52 AM

## 2019-09-29 NOTE — Progress Notes (Addendum)
      [address]       [place] [address]             [phone]      10 Days Post-Op Procedure(s) (LRB): CORONARY ARTERY BYPASS GRAFTING (CABG) using LIMA to LAD; Endoscopic harvest right saphenous vein: SVG tp Diag; SVG sequential to OM1 and OM2. (N/A) TRANSESOPHAGEAL ECHOCARDIOGRAM (TEE) (N/A) Endovein Harvest Of Greater Saphenous Vein (Right) CLOSURE OF PATENT DUCTUS ARTERIOSUS (N/A)   Subjective:  No new complaints.  Feeling pretty good. + BM, + ambulation  Objective: Vital signs in last 24 hours: Temp:  [97.6 F (36.4 C)-97.7 F (36.5 C)] 97.7 F (36.5 C) (03/23 0700) Pulse Rate:  [80-82] 80 (03/23 0700) Cardiac Rhythm: Heart block;Bundle branch block (03/23 0700) Resp:  [14-26] 26 (03/23 0700) BP: (100-106)/(56-64) 101/56 (03/23 0700) SpO2:  [98 %-100 %] 99 % (03/23 0700) Weight:  [69.8 kg] 69.8 kg (03/23 0335)  Hemodynamic parameters for last 24 hours: CVP:  [1 mmHg-2 mmHg] 2 mmHg  Intake/Output from previous day: 03/22 0701 - 03/23 0700 In: 1049.1 [P.O.:960; I.V.:89.1] Out: 1500 [Urine:1500]  General appearance: alert, cooperative and no distress Heart: regular rate and rhythm Lungs: clear to auscultation bilaterally Abdomen: soft, non-tender; bowel sounds normal; no masses,  no organomegaly Extremities: edema trace Wound: clean and dry  Lab Results: Recent Labs    09/27/19 0429 09/28/19 0404  WBC 7.7 8.8  HGB 9.2* 9.2*  HCT 27.7* 28.1*  PLT 156 216   BMET:  Recent Labs    09/28/19 0404 09/29/19 0336  NA 129* 129*  K 4.0 3.8  CL 98 100  CO2 21* 22  GLUCOSE 98 90  BUN 17 16  CREATININE 0.84 0.75  CALCIUM 8.2* 8.2*    PT/INR: No results for input(s): LABPROT, INR in the last 72 hours. ABG    Component Value Date/Time   PHART 7.469 (H) 09/20/2019 1944   HCO3 23.2 09/20/2019 1944   TCO2 24 09/20/2019 1944   ACIDBASEDEF 2.0 09/20/2019 1814   O2SAT 74.5 09/29/2019 0330   CBG (last 3)  Recent Labs     09/28/19 0435 09/28/19 0806 09/28/19 1600  GLUCAP 98 121* 101*    Assessment/Plan: S/P Procedure(s) (LRB): CORONARY ARTERY BYPASS GRAFTING (CABG) using LIMA to LAD; Endoscopic harvest right saphenous vein: SVG tp Diag; SVG sequential to OM1 and OM2. (N/A) TRANSESOPHAGEAL ECHOCARDIOGRAM (TEE) (N/A) Endovein Harvest Of Greater Saphenous Vein (Right) CLOSURE OF PATENT DUCTUS ARTERIOSUS (N/A)  1. CV- NSR, BP controlled- on Amiodarone, Digoxin, Losartan.Marland Kitchen on Milrinone at 0.125, Coox is 74.9, can likely d/c Milrinone today will defer to AHF 2. Pulm- CXR with small bilateral effusions, mild pulmonary edema/atelectasis 3. Renal- creatinine stable, K is at 3.8 on Spironolactone, with new pulmonary edema, mild LE edema may benefit from some Lasix 4. Dispo- patient stable, co-ox 74.9 can hopefully stop Milrinone today, may benefit from additional diuresis with Lasix if BP allows, patient lives alone will need SNF placement have consulted TOC.Marland Kitchen patient will be ready for d/c once stable off HF meds.. hopefully in next 24-48 hours   LOS: 11 days    Dennis Dandy, PA-C  09/29/2019 Pt seen and examined; agree with note. Anticipate disposition relatively soon. Dennis Roberts Z. Vickey Sages, MD [phone]

## 2019-09-29 NOTE — Evaluation (Signed)
Occupational Therapy Evaluation Patient Details Name: Dennis Roberts MRN: [id number] DOB: 09-Jan-1946 Today's Date: 09/29/2019    History of Present Illness Pt adm with acute systolic heart failure and cariogenic shock. Impella placed 3/12. 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 Impella removed 3/18. PMH - none.   Clinical Impression   Pt PTA: Pt lives alone and reports independence prior. Pt currently fatigues easily, but physically, pt able to perform all ADL with modified independence to [place]. Pt education provided on energy conservation. I expect pt to increase independence to have care from his sister at home or in her home s/p dc for supervisionA. Pt would benefit from 1 more OT session to assure safety with ADL in standing. OT following acutely.  Pt reported minimal dizziness today. BP stable with exertion.     Follow Up Recommendations  No OT follow up;Supervision - Intermittent    Equipment Recommendations  None recommended by OT    Recommendations for Other Services       Precautions / Restrictions Precautions Precautions: Sternal Precaution Booklet Issued: No Precaution Comments: Pt able to state no pushing/pulling. Use of heart pillow for sit to stands Restrictions Weight Bearing Restrictions: Yes(sternal)      Mobility Bed Mobility Overal bed mobility: Needs Assistance             General bed mobility comments: Pt verbally discussion of log roll  Transfers Overall transfer level: Needs assistance Equipment used: Rolling walker (2 wheeled) Transfers: Sit to/from Stand Sit to Stand: Supervision         General transfer comment: No physical assist required; OTR managing line.    Balance Overall balance assessment: Mild deficits observed, not formally tested                                         ADL either performed or assessed with clinical judgement   ADL Overall ADL's : At baseline                                        General ADL Comments: Pt fatigues easily, but physically, pt able to perform all ADL with modified independence to [place]. Pt using figure 4 technique for LB ADL. Pt education provided on energy conservation. I expect pt to increase independence to have care from his sister at home or in her home s/p dc for supervisionA.     Vision Baseline Vision/History: Wears glasses Wears Glasses: At all times Patient Visual Report: No change from baseline Vision Assessment?: No apparent visual deficits     Perception     Praxis      Pertinent Vitals/Pain Pain Assessment: No/denies pain     Hand Dominance Left   Extremity/Trunk Assessment Upper Extremity Assessment Upper Extremity Assessment: Overall WFL for tasks assessed   Lower Extremity Assessment Lower Extremity Assessment: Defer to PT evaluation;Overall [facility] for tasks assessed   Cervical / Trunk Assessment Cervical / Trunk Assessment: Normal   Communication Communication Communication: No difficulties   Cognition Arousal/Alertness: Awake/alert Behavior During Therapy: WFL for tasks assessed/performed Overall Cognitive Status: Within Functional Limits for tasks assessed  General Comments  VSS on RA    Exercises     Shoulder Instructions      Home Living Family/patient expects to be discharged to:: Private residence Living Arrangements: Alone Available Help at Discharge: Family;Available 24 hours/day Type of Home: House Home Access: Stairs to enter [employer] of Steps: 2 Entrance Stairs-Rails: Right Home Layout: One level     Bathroom Shower/Tub: Producer, television/film/video: Standard     Home Equipment: None          Prior Functioning/Environment Level of Independence: Independent                 OT Problem List: Decreased activity tolerance;Cardiopulmonary status limiting activity      OT Treatment/Interventions:  Self-care/ADL training;Therapeutic exercise;Energy conservation;DME and/or AE instruction;Therapeutic activities;Patient/family education;Balance training    OT Goals(Current goals can be found in the care plan section) Acute Rehab OT Goals Patient Stated Goal: return home OT Goal Formulation: With patient Time For Goal Achievement: 10/13/19 Potential to Achieve Goals: Good ADL Goals Pt/caregiver will Perform Home Exercise Program: Increased strength;Independently Additional ADL Goal #1: pt will complete 15 mins ADL tasks wtih modified independence level with 1 seated rest break throughout utilizing energy conservation techniques after education.  OT Frequency: Min 2X/week   Barriers to D/C:            Co-evaluation              AM-PAC OT "6 Clicks" Daily Activity     Outcome Measure Help from another person eating meals?: None Help from another person taking care of personal grooming?: None Help from another person toileting, which includes using toliet, bedpan, or urinal?: None Help from another person bathing (including washing, rinsing, drying)?: A Little Help from another person to put on and taking off regular upper body clothing?: None Help from another person to put on and taking off regular lower body clothing?: None 6 Click Score: 23   End of Session Equipment Utilized During Treatment: Rolling walker Nurse Communication: Mobility status  Activity Tolerance: Patient limited by fatigue Patient left: in chair;with call bell/phone within reach;with nursing/sitter in room  OT Visit Diagnosis: Unsteadiness on feet (R26.81);Muscle weakness (generalized) (M62.81)                Time: 0950-1006 OT Time Calculation (min): 16 min Charges:  OT General Charges $OT Visit: 1 Visit OT Evaluation $OT Eval Moderate Complexity: 1 Mod  Flora Lipps, OTR/L Acute Rehabilitation Services Pager: [phone] Office: [phone]   Malavika Lira C 09/29/2019, 5:30 PM

## 2019-09-29 NOTE — Plan of Care (Signed)
  Problem: Education: Goal: Knowledge of General Education information will improve Description: Including pain rating scale, medication(s)/side effects and non-pharmacologic comfort measures Outcome: Progressing   Problem: Health Behavior/Discharge Planning: Goal: Ability to manage health-related needs will improve Outcome: Progressing   Problem: Clinical Measurements: Goal: Ability to maintain clinical measurements within normal limits will improve Outcome: Progressing Goal: Will remain free from infection Outcome: Progressing Goal: Diagnostic test results will improve Outcome: Progressing Goal: Cardiovascular complication will be avoided Outcome: Progressing   Problem: Activity: Goal: Risk for activity intolerance will decrease Outcome: Progressing   Problem: Nutrition: Goal: Adequate nutrition will be maintained Outcome: Progressing   Problem: Coping: Goal: Level of anxiety will decrease Outcome: Progressing   Problem: Elimination: Goal: Will not experience complications related to bowel motility Outcome: Progressing Goal: Will not experience complications related to urinary retention Outcome: Progressing   Problem: Pain Managment: Goal: General experience of comfort will improve Outcome: Progressing   Problem: Safety: Goal: Ability to remain free from injury will improve Outcome: Progressing   Problem: Skin Integrity: Goal: Risk for impaired skin integrity will decrease Outcome: Progressing   Problem: Education: Goal: Will demonstrate proper wound care and an understanding of methods to prevent future damage Outcome: Progressing Goal: Knowledge of disease or condition will improve Outcome: Progressing Goal: Knowledge of the prescribed therapeutic regimen will improve Outcome: Progressing Goal: Individualized Educational Video(s) Outcome: Progressing   Problem: Activity: Goal: Risk for activity intolerance will decrease Outcome: Progressing    Problem: Cardiac: Goal: Will achieve and/or maintain hemodynamic stability Outcome: Progressing   Problem: Clinical Measurements: Goal: Postoperative complications will be avoided or minimized Outcome: Progressing   Problem: Respiratory: Goal: Respiratory status will improve Outcome: Progressing   Problem: Skin Integrity: Goal: Wound healing without signs and symptoms of infection Outcome: Progressing Goal: Risk for impaired skin integrity will decrease Outcome: Progressing   Problem: Urinary Elimination: Goal: Ability to achieve and maintain adequate renal perfusion and functioning will improve Outcome: Progressing   

## 2019-09-29 NOTE — Progress Notes (Addendum)
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  - Impella removed 3/18  Remains on milrinone 0.125 mcg. Co-ox good today at 75%  CVP 2-3  No chest pain or dyspnea. Still gets lightheaded w/ standing and w/ ambulation. SBP 100-106.   Objective:   Weight Range:  Vital Signs:   Temp:  [97.6 F (36.4 C)-97.7 F (36.5 C)] 97.7 F (36.5 C) (03/23 0700) Pulse Rate:  [80-82] 80 (03/23 0700) Resp:  [14-26] 26 (03/23 0700) BP: (100-106)/(56-64) 101/56 (03/23 0700) SpO2:  [98 %-100 %] 99 % (03/23 0700) Weight:  [69.8 kg] 69.8 kg (03/23 0335) Last BM Date: 09/28/19  Weight change: Filed Weights   09/27/19 0600 09/28/19 0300 09/29/19 0335  Weight: 69.1 kg 69.4 kg 69.8 kg    Intake/Output:   Intake/Output Summary (Last 24 hours) at 09/29/2019 0904 Last data filed at 09/29/2019 0650 Gross per 24 hour  Intake 1049.11 ml  Output 1500 ml  Net -450.89 ml    PHYSICAL EXAM: General:  Well appearing. OBB in chair No respiratory difficulty HEENT: normal Neck: supple. no JVD. Carotids 2+ bilat; no bruits. No lymphadenopathy or thyromegaly appreciated. Cor: PMI nondisplaced. Regular rate & rhythm. No rubs, gallops or murmurs. Sternotomy site stable  Lungs: clear Abdomen: soft, nontender, nondistended. No hepatosplenomegaly. No bruits or masses. Good bowel sounds. Extremities: no cyanosis, clubbing, rash, edema Neuro: alert & oriented x 3, cranial nerves grossly intact. moves all 4 extremities w/o difficulty. Affect pleasant.  Telemetry: NSR mid 80s  Personally reviewed   Labs: Basic Metabolic Panel: Recent Labs  Lab 09/23/19 0437 09/23/19 1455 09/24/19 0354 09/24/19 0620 09/25/19 0421 09/25/19 0421 09/26/19 1041 09/26/19 1041 09/27/19 0429 09/28/19 0404 09/29/19 0336  NA 132*   < >  --    < > 127*  --  128*  --  126*  129* 129*  K 3.7   < >  --    < > 3.7  --  3.4*  --  3.5 4.0 3.8  CL 91*   < >  --    < > 88*  --  92*  --  92* 98 100  CO2 27   < >  --    < > 26  --  26  --  23 21* 22  GLUCOSE 108*   < >  --    < > 103*  --  111*  --  117* 98 90  BUN 24*   < >  --    < > 20  --  19  --  _0 CREATININE 1.17   < >  --    < > 0.98  --  0.86  --  0.82 0.84 0.75  CALCIUM 8.4*   < >  --    < > 8.5*   < > 8.4*   < > 8.1* 8.2* 8.2*  MG 1.8  --  1.9  --   --   --   --   --   --   --   --    < > = values in this interval not displayed.    Liver Function Tests: No results for input(s): AST, ALT, ALKPHOS, BILITOT, PROT, ALBUMIN in the last 168 hours. No results for input(s): LIPASE, AMYLASE in the last 168  hours. No results for input(s): AMMONIA in the last 168 hours.  CBC: Recent Labs  Lab 09/24/19 0354 09/24/19 0354 09/24/19 1727 09/25/19 0421 09/26/19 0402 09/27/19 0429 09/28/19 0404  WBC 8.0   < > 5.9 5.2 6.7 7.7 8.8  NEUTROABS 5.6  --   --  3.1 4.2 5.2 6.3  HGB 10.0*   < > 9.6* 9.6* 9.8* 9.2* 9.2*  HCT 29.7*   < > 28.4* 28.2* 28.5* 27.7* 28.1*  MCV 90.8   < > 89.3 90.7 87.7 89.6 90.6  PLT 63*   < > 67* 83* 124* 156 216   < > = values in this interval not displayed.    Cardiac Enzymes: No results for input(s): CKTOTAL, CKMB, CKMBINDEX, TROPONINI in the last 168 hours.  BNP: BNP (last 3 results) Recent Labs    09/18/19 1300  BNP 1,784.5*    ProBNP (last 3 results) No results for input(s): PROBNP in the last 8760 hours.    Other results:  Imaging: DG Chest 2 View  Result Date: 09/29/2019 CLINICAL DATA:  Pleural effusion. EXAM: CHEST - 2 VIEW COMPARISON:  09/28/2019. FINDINGS: PICC line noted with tip over SVC. Prior CABG. Stable cardiomegaly. Bilateral pulmonary infiltrates/edema noted on today's exam. Persistent bibasilar atelectasis. Small bilateral pleural effusions again noted. No pneumothorax. IMPRESSION: 1.  PICC line noted with tip over SVC. 2.  Prior CABG.  Stable  cardiomegaly. 3. Bilateral pulmonary infiltrates/edema noted on today's exam. Persistent bibasilar atelectasis. Small bilateral pleural effusions again noted. Electronically Signed   By: Marcello Moores  Register   On: 09/29/2019 07:48   DG Chest 2 View  Result Date: 09/28/2019 CLINICAL DATA:  Postop CABG EXAM: CHEST - 2 VIEW COMPARISON:  Radiograph 09/24/2019 FINDINGS: Retraction of Swan-Ganz catheter and Impella device. Removal of chest tube. PICC line remains. No pneumothorax. Improvement in RIGHT effusion. Persistent bibasilar atelectasis and LEFT effusion. IMPRESSION: 1. Removal of chest tubes.  No pneumothorax 2. Improvement RIGHT effusion. 3. Persistent small LEFT effusion and basilar atelectasis. Electronically Signed   By: Suzy Bouchard M.D.   On: 09/28/2019 12:59     Medications:     Scheduled Medications: . amiodarone  200 mg Oral BID  . aspirin EC  325 mg Oral Daily   Or  . aspirin  324 mg Per Tube Daily  . bisacodyl  10 mg Oral Daily   Or  . bisacodyl  10 mg Rectal Daily  . chlorhexidine gluconate (MEDLINE KIT)  15 mL Mouth Rinse BID  . Chlorhexidine Gluconate Cloth  6 each Topical Daily  . colchicine  0.3 mg Oral BID  . [facility], Patient & Family Education   Does not apply Once  . digoxin  0.125 mg Oral Daily  . docusate sodium  200 mg Oral Daily  . losartan  12.5 mg Oral Daily  . pantoprazole  40 mg Oral Daily  . sodium chloride flush  10-40 mL Intracatheter Q12H  . sodium chloride flush  3 mL Intravenous Q12H  . sorbitol  30 mL Oral Once  . spironolactone  12.5 mg Oral Daily    Infusions:  . sodium chloride 10 mL/hr at 09/20/19 1900  . sodium chloride    . milrinone 0.125 mcg/kg/min (09/29/19 0334)    PRN Medications: sodium chloride, acetaminophen, ALPRAZolam, lip balm, midazolam, morphine injection, ondansetron (ZOFRAN) IV, oxyCODONE, sodium chloride flush, sodium chloride flush, sodium chloride flush, traMADol, zolpidem   Assessment/Plan:    1. Acute systolic HF ->Cardiogenic shock - 09/18/19:  Echo EF 20% RV ok - 09/18/19: Cath 3v CAD with 99% LM. Required Impella support - Impella out 3/18. Swan out 3/19 - On milrinone 0.125 mcg. Co-ox 75%.  - Stop milrinone and follow co-ox - CVP 2-3. Wt stable. No diuretics - Continue digoxin 0.125 mg daily.  - Continue 12.5 mg spironolactone daily.  - Continue losartan 12.5 daily for now.  - Will check orthostatics. If +, may need to cut meds.   2. Unstable angina with 3v-CAD - 09/18/19 cath with 99% LM - Underwent CABG 3/13 (LIMA to LAD; SVG to Diag; SVG sequential to OM1 and OM2) - No s/s ischemia - On ASA, statin - BP too soft for  blocker  3. Acute hypoxic respiratory failure - Extubated 3/14. Sats stable on room air.  - No change. Encourage IS   4. Expected post-op blood loss - hgb stable ~9. Transfuse as needed to keep > 7.5  5, Hyponatremia: - Na 128 -> 126->129 . Limit free h20  6. Constipation - resolved w/ sorbitol  7. Deconditioning - continue to ambulate w/ PT    Length of Stay: [place] PA-C 09/29/2019, 9:04 AM  Advanced Heart Failure Team Pager [phone] (M-F; [place])  Please contact [facility] for night-coverage after hours (4p -7a ) and weekends on [email or web address]  Patient seen and examined with the above-signed Advanced Practice Provider and/or Housestaff. I personally reviewed laboratory data, imaging studies and relevant notes. I independently examined the patient and formulated the important aspects of the plan. I have edited the note to reflect any of my changes or salient points. I have personally discussed the plan with the patient and/or family.  Remains on milrinone 0.125. Feels a bit weak and orthostatic. CVP low. Co-ox looks good at 74%  Renal function stable. Remains in NSR.  Serum sodium improved at 129   General:  Sitting up in chair. No resp difficulty HEENT: normal Neck: supple. no JVD. Carotids 2+ bilat; no bruits. No  lymphadenopathy or thryomegaly appreciated. Cor: sternal wound ok PMI nondisplaced. Regular rate & rhythm. No rubs, gallops or murmurs. Lungs: clear dull at bases Abdomen: soft, nontender, nondistended. No hepatosplenomegaly. No bruits or masses. Good bowel sounds. Extremities: no cyanosis, clubbing, rash, edema Neuro: alert & orientedx3, cranial nerves grossly intact. moves all 4 extremities w/o difficulty. Affect pleasant  Making progress. Can stop milrinone. Follow co-ox and CVP. No diuretics yet. BP too low to titrate HF meds. Continue to ambulate. Supp electrolytes.Glori Bickers, MD  10:19 AM

## 2019-09-30 ENCOUNTER — Inpatient Hospital Stay (HOSPITAL_COMMUNITY): Payer: Medicare Other

## 2019-09-30 LAB — COOXEMETRY PANEL
Carboxyhemoglobin: 2.4 % — ABNORMAL HIGH (ref 0.5–1.5)
Methemoglobin: 1.2 % (ref 0.0–1.5)
O2 Saturation: 78.7 %
Total hemoglobin: 9.8 g/dL — ABNORMAL LOW (ref 12.0–16.0)

## 2019-09-30 LAB — BASIC METABOLIC PANEL
Anion gap: 8 (ref 5–15)
BUN: 14 mg/dL (ref 8–23)
CO2: 21 mmol/L — ABNORMAL LOW (ref 22–32)
Calcium: 8.1 mg/dL — ABNORMAL LOW (ref 8.9–10.3)
Chloride: 99 mmol/L (ref 98–111)
Creatinine, Ser: 0.75 mg/dL (ref 0.61–1.24)
GFR calc Af Amer: >60 mL/min (ref >60)
GFR calc non Af Amer: >60 mL/min (ref >60)
Glucose, Bld: 92 mg/dL (ref 70–99)
Potassium: 3.8 mmol/L (ref 3.5–5.1)
Sodium: 128 mmol/L — ABNORMAL LOW (ref 135–145)

## 2019-09-30 MED ORDER — SPIRONOLACTONE 25 MG PO TABS
25.0000 mg | ORAL_TABLET | Freq: Every day | ORAL | Status: DC
  Administered 2019-09-30: 25 mg via ORAL
  Filled 2019-09-30: qty 1

## 2019-09-30 MED ORDER — POTASSIUM CHLORIDE CRYS ER 20 MEQ PO TBCR: 40.0000 meq | EXTENDED_RELEASE_TABLET | Freq: Once | ORAL | Status: DC

## 2019-09-30 MED ORDER — GADOBUTROL 1 MMOL/ML IV SOLN
6.0000 mL | Freq: Once | INTRAVENOUS | Status: AC | PRN
  Administered 2019-09-30: 6 mL via INTRAVENOUS

## 2019-09-30 MED ORDER — ENOXAPARIN SODIUM 30 MG/0.3ML ~~LOC~~ SOLN: 30.0000 mg | SUBCUTANEOUS | Status: DC

## 2019-09-30 MED ORDER — FUROSEMIDE 40 MG PO TABS: 40.0000 mg | ORAL_TABLET | Freq: Once | ORAL | Status: DC

## 2019-09-30 MED ORDER — LOPERAMIDE HCL 2 MG PO CAPS
2.0000 mg | ORAL_CAPSULE | ORAL | Status: DC | PRN
  Administered 2019-09-30 – 2019-10-01 (×2): 2 mg via ORAL
  Filled 2019-09-30 (×2): qty 1

## 2019-09-30 NOTE — Progress Notes (Addendum)
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  - Impella removed 3/18  Milrinone discontinued 3/23. Co-ox stable at 79%.   Diuretics have been on hold for low CVP and soft BP. Wt has remained stable at 153 lb.   SBPs 95-101. Orthostatic VS negative.    Orthostatic VS for the past 24 hrs:  BP- Sitting Pulse- Sitting BP- Standing at 0 minutes Pulse- Standing at 0 minutes  09/29/19 1215 105/68 83 100/56 88   Denies CP and dyspnea. OOB and in chair. Hopes to go home tomorrow.   Objective:   Weight Range:  Vital Signs:   Temp:  [97.7 F (36.5 C)-98.8 F (37.1 C)] 97.7 F (36.5 C) (03/24 0807) Pulse Rate:  [79-91] 86 (03/24 0314) Resp:  [20-25] 25 (03/24 0807) BP: (95-113)/(56-67) 101/60 (03/24 0950) SpO2:  [98 %-100 %] 99 % (03/24 0314) Weight:  [69.8 kg] 69.8 kg (03/24 0655) Last BM Date: 09/28/19  Weight change: Filed Weights   09/28/19 0300 09/29/19 0335 09/30/19 0655  Weight: 69.4 kg 69.8 kg 69.8 kg    Intake/Output:   Intake/Output Summary (Last 24 hours) at 09/30/2019 1032 Last data filed at 09/30/2019 0656 Gross per 24 hour  Intake 500 ml  Output 850 ml  Net -350 ml    PHYSICAL EXAM: General:  Well appearing. OBB, sitting in chair No respiratory difficulty HEENT: normal Neck: supple. no JVD. Carotids 2+ bilat; no bruits. No lymphadenopathy or thyromegaly appreciated. Cor: PMI nondisplaced. Regular rate & rhythm. No rubs, gallops or murmurs. Sternotomy site stable.  Lungs: clear no wheezing  Abdomen: soft, nontender, nondistended. No hepatosplenomegaly. No bruits or masses. Good bowel sounds. Extremities: no cyanosis, clubbing, rash, edema Neuro: alert & oriented x 3, cranial nerves grossly intact. moves all 4 extremities w/o difficulty. Affect pleasant.   Telemetry: NSR mid 80s  Personally  reviewed   Labs: Basic Metabolic Panel: Recent Labs  Lab 09/24/19 0354 09/24/19 0620 09/26/19 1041 09/26/19 1041 09/27/19 0429 09/27/19 0429 09/28/19 0404 09/29/19 0336 09/30/19 0408  NA  --    < > 128*  --  126*  --  129* 129* 128*  K  --    < > 3.4*  --  3.5  --  4.0 3.8 3.8  CL  --    < > 92*  --  92*  --  98 100 99  CO2  --    < > 26  --  23  --  21* 22 21*  GLUCOSE  --    < > 111*  --  117*  --  98 90 92  BUN  --    < > 19  --  16  --  '17 16 14  '$ CREATININE  --    < > 0.86  --  0.82  --  0.84 0.75 0.75  CALCIUM  --    < > 8.4*   < > 8.1*   < > 8.2* 8.2* 8.1*  MG 1.9  --   --   --   --   --   --   --   --    < > = values in this interval not displayed.    Liver Function Tests: No results for input(s): AST, ALT, ALKPHOS, BILITOT, PROT, ALBUMIN in the last 168 hours. No  results for input(s): LIPASE, AMYLASE in the last 168 hours. No results for input(s): AMMONIA in the last 168 hours.  CBC: Recent Labs  Lab 09/24/19 0354 09/24/19 0354 09/24/19 1727 09/25/19 0421 09/26/19 0402 09/27/19 0429 09/28/19 0404  WBC 8.0   < > 5.9 5.2 6.7 7.7 8.8  NEUTROABS 5.6  --   --  3.1 4.2 5.2 6.3  HGB 10.0*   < > 9.6* 9.6* 9.8* 9.2* 9.2*  HCT 29.7*   < > 28.4* 28.2* 28.5* 27.7* 28.1*  MCV 90.8   < > 89.3 90.7 87.7 89.6 90.6  PLT 63*   < > 67* 83* 124* 156 216   < > = values in this interval not displayed.    Cardiac Enzymes: No results for input(s): CKTOTAL, CKMB, CKMBINDEX, TROPONINI in the last 168 hours.  BNP: BNP (last 3 results) Recent Labs    09/18/19 1300  BNP 1,784.5*    ProBNP (last 3 results) No results for input(s): PROBNP in the last 8760 hours.    Other results:  Imaging: DG Chest 2 View  Result Date: 09/29/2019 CLINICAL DATA:  Pleural effusion. EXAM: CHEST - 2 VIEW COMPARISON:  09/28/2019. FINDINGS: PICC line noted with tip over SVC. Prior CABG. Stable cardiomegaly. Bilateral pulmonary infiltrates/edema noted on today's exam. Persistent bibasilar  atelectasis. Small bilateral pleural effusions again noted. No pneumothorax. IMPRESSION: 1.  PICC line noted with tip over SVC. 2.  Prior CABG.  Stable cardiomegaly. 3. Bilateral pulmonary infiltrates/edema noted on today's exam. Persistent bibasilar atelectasis. Small bilateral pleural effusions again noted. Electronically Signed   By: Marcello Moores  Register   On: 09/29/2019 07:48   DG Chest 2 View  Result Date: 09/28/2019 CLINICAL DATA:  Postop CABG EXAM: CHEST - 2 VIEW COMPARISON:  Radiograph 09/24/2019 FINDINGS: Retraction of Swan-Ganz catheter and Impella device. Removal of chest tube. PICC line remains. No pneumothorax. Improvement in RIGHT effusion. Persistent bibasilar atelectasis and LEFT effusion. IMPRESSION: 1. Removal of chest tubes.  No pneumothorax 2. Improvement RIGHT effusion. 3. Persistent small LEFT effusion and basilar atelectasis. Electronically Signed   By: Suzy Bouchard M.D.   On: 09/28/2019 12:59     Medications:     Scheduled Medications: . amiodarone  200 mg Oral BID  . aspirin EC  325 mg Oral Daily   Or  . aspirin  324 mg Per Tube Daily  . bisacodyl  10 mg Oral Daily   Or  . bisacodyl  10 mg Rectal Daily  . chlorhexidine gluconate (MEDLINE KIT)  15 mL Mouth Rinse BID  . Chlorhexidine Gluconate Cloth  6 each Topical Daily  . colchicine  0.3 mg Oral BID  . [facility], Patient & Family Education   Does not apply Once  . digoxin  0.125 mg Oral Daily  . docusate sodium  200 mg Oral Daily  . losartan  12.5 mg Oral QHS  . pantoprazole  40 mg Oral Daily  . sodium chloride flush  10-40 mL Intracatheter Q12H  . sodium chloride flush  3 mL Intravenous Q12H  . sorbitol  30 mL Oral Once  . spironolactone  12.5 mg Oral QHS    Infusions:  . sodium chloride 10 mL/hr at 09/20/19 1900  . sodium chloride      PRN Medications: sodium chloride, acetaminophen, ALPRAZolam, lip balm, midazolam, morphine injection, ondansetron (ZOFRAN) IV, oxyCODONE, sodium  chloride flush, sodium chloride flush, sodium chloride flush, traMADol, zolpidem   Assessment/Plan:   1. Acute systolic HF ->Cardiogenic shock -  09/18/19: Echo EF 20% RV ok - 09/18/19: Cath 3v CAD with 99% LM. Required Impella support - Impella out 3/18. Swan out 3/19 - Co-ox stable off of milrinone. 79%. - Volume status stable. No loop diuretic requirements currently - Continue digoxin 0.125 mg daily.  - Continue 12.5 mg spironolactone daily.  - Continue losartan 12.5 daily for now.  - BP too soft for further med titration currently   2. Unstable angina with 3v-CAD - 09/18/19 cath with 99% LM - Underwent CABG 3/13 (LIMA to LAD; SVG to Diag; SVG sequential to OM1 and OM2) - No s/s ischemia - On ASA, statin - BP too soft for  blocker  3. Acute hypoxic respiratory failure - Extubated 3/14. Sats stable on room air.  - No change. Encourage IS   4. Expected post-op blood loss - hgb stable ~9. Transfuse as needed to keep > 7.5  5, Hyponatremia: - Na 128 -> 126->129->128. mentation ok. Limit free h20  6. Constipation - resolved w/ sorbitol  7. Deconditioning - continue to ambulate w/ PT  Dispo: likely home tomorrow, per CT surgery. We will arrange [facility] f/u.      Length of Stay: [address] Ladoris Gene 09/30/2019, 10:32 AM  Advanced Heart Failure Team Pager [phone] (M-F; [place])  Please contact [facility] for night-coverage after hours (4p -7a ) and weekends on [email or web address]  Patient seen and examined with the above-signed Advanced Practice Provider and/or Housestaff. I personally reviewed laboratory data, imaging studies and relevant notes. I independently examined the patient and formulated the important aspects of the plan. I have edited the note to reflect any of my changes or salient points. I have personally discussed the plan with the patient and/or family.  Still quite dizzy. It is not positional, vertiginous or orthostatic in nature. Also having diarrhea.  CXR with mild edema. Co-ox ok.   General:  Sitting in chair No resp difficulty HEENT: normal Neck: supple. JVP 8. Carotids 2+ bilat; no bruits. No lymphadenopathy or thryomegaly appreciated. Cor: sternal wound  PMI nondisplaced. Regular rate & rhythm. No rubs, gallops or murmurs. Lungs: clear Abdomen: soft, nontender, nondistended. No hepatosplenomegaly. No bruits or masses. Good bowel sounds. Extremities: no cyanosis, clubbing, rash, tr edema Neuro: alert & orientedx3, cranial nerves grossly intact. moves all 4 extremities w/o difficulty. Affect pleasant  Given ongoing dizziness with get MRI brain to look for CVA. Had mild volume overload. Give on dose lasix. Place TED hose.   Having diarrhea. Stop colchicine and stool softeners.   Glori Bickers, MD  2:10 PM

## 2019-09-30 NOTE — Progress Notes (Signed)
11 Days Post-Op Procedure(s) (LRB): CORONARY ARTERY BYPASS GRAFTING (CABG) using LIMA to LAD; Endoscopic harvest right saphenous vein: SVG tp Diag; SVG sequential to OM1 and OM2. (N/A) TRANSESOPHAGEAL ECHOCARDIOGRAM (TEE) (N/A) Endovein Harvest Of Greater Saphenous Vein (Right) CLOSURE OF PATENT DUCTUS ARTERIOSUS (N/A) Subjective: Sitting up eating breakfast, says he feels good.  Still gets "light headed" when out of bed, ambulating.  Orthostatic VS check yesterday was OK.   Objective: Vital signs in last 24 hours: Temp:  [97.8 F (36.6 C)-98.8 F (37.1 C)] 97.8 F (36.6 C) (03/24 0314) Pulse Rate:  [79-91] 86 (03/24 0314) Cardiac Rhythm: Normal sinus rhythm;Heart block (03/24 0700) Resp:  [20-22] 22 (03/24 0314) BP: (99-113)/(59-67) 102/59 (03/24 0314) SpO2:  [98 %-100 %] 99 % (03/24 0314) Weight:  [69.8 kg] 69.8 kg (03/24 0655)     Intake/Output from previous day: 03/23 0701 - 03/24 0700 In: 500 [P.O.:480; I.V.:20] Out: 850 [Urine:850] Intake/Output this shift: No intake/output data recorded.  General appearance: alert, cooperative and no distress Neurologic: intact Heart: regular rate and rhythm Lungs: Breath sounds clear, CXR 3/23 with small bilateral effusions and basilar densities noted.  Extremities: All well perfused with no peripheral edema.  Wound: the sternotomy incision and RLE EVH incisions are intact and dry.   Lab Results: Recent Labs    09/28/19 0404  WBC 8.8  HGB 9.2*  HCT 28.1*  PLT 216   BMET:  Recent Labs    09/29/19 0336 09/30/19 0408  NA 129* 128*  K 3.8 3.8  CL 100 99  CO2 22 21*  GLUCOSE 90 92  BUN 16 14  CREATININE 0.75 0.75  CALCIUM 8.2* 8.1*    PT/INR: No results for input(s): LABPROT, INR in the last 72 hours. ABG    Component Value Date/Time   PHART 7.469 (H) 09/20/2019 1944   HCO3 23.2 09/20/2019 1944   TCO2 24 09/20/2019 1944   ACIDBASEDEF 2.0 09/20/2019 1814   O2SAT 78.7 09/30/2019 0400   CBG (last 3)  Recent Labs     09/28/19 0435 09/28/19 0806 09/28/19 1600  GLUCAP 98 121* 101*    Assessment/Plan: S/P Procedure(s) (LRB): CORONARY ARTERY BYPASS GRAFTING (CABG) using LIMA to LAD; Endoscopic harvest right saphenous vein: SVG tp Diag; SVG sequential to OM1 and OM2. (N/A) TRANSESOPHAGEAL ECHOCARDIOGRAM (TEE) (N/A) Endovein Harvest Of Greater Saphenous Vein (Right) CLOSURE OF PATENT DUCTUS ARTERIOSUS (N/A)  -POD-10 urgent CABG for 99% LM stenosis and MVCAD presenting with cardiogenic shock. Initially supported with Impella that was removed 3/18. Milrinone weaned off yesterday, CO Ox 78 this morning.  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  On statin, ASA, digoxin, losartan,spironolactone. Advanced HF team managing medications.   -Pulm- On RA with acceptable O2 sats. Small bilat effusions / ATX on CXR yesterday.  He does not appear volume overloaded.  Encouraging pulm hygiene.   -Renal- Stable creat, K+ 3.8.  -Disposition- Progressing well. Anticipate discharge tomorrow. He lives alone but plans to stay with his sister temporarily who lives nearby    LOS: 12 days   Leary Roca, [place] [phone] 09/30/2019

## 2019-09-30 NOTE — Progress Notes (Signed)
CARDIAC REHAB PHASE I   PRE:  Rate/Rhythm: 82 SR    BP: sitting 106/59    SaO2: 98 RA  MODE:  Ambulation: 410 ft   POST:  Rate/Rhythm: 92 SR    BP: sitting 120/65     SaO2: 99 RA  Pt moving independently. Ambulated with rollator (he has at home). No assist needed. Only c/o is continual slight lightheadedness, no worse with standing. To recliner. Encouraged x2 more walks and IS.  [id number]  Harriet Masson CES, ACSM 09/30/2019 10:49 AM

## 2019-10-01 LAB — CBC
HCT: 29.6 % — ABNORMAL LOW (ref 39.0–52.0)
Hemoglobin: 9.7 g/dL — ABNORMAL LOW (ref 13.0–17.0)
MCH: 29.6 pg (ref 26.0–34.0)
MCHC: 32.8 g/dL (ref 30.0–36.0)
MCV: 90.2 fL (ref 80.0–100.0)
Platelets: 258 10*3/uL (ref 150–400)
RBC: 3.28 MIL/uL — ABNORMAL LOW (ref 4.22–5.81)
RDW: 14.6 % (ref 11.5–15.5)
WBC: 7.6 10*3/uL (ref 4.0–10.5)
nRBC: 0 % (ref 0.0–0.2)

## 2019-10-01 LAB — BASIC METABOLIC PANEL
Anion gap: 7 (ref 5–15)
BUN: 18 mg/dL (ref 8–23)
CO2: 22 mmol/L (ref 22–32)
Calcium: 8.4 mg/dL — ABNORMAL LOW (ref 8.9–10.3)
Chloride: 100 mmol/L (ref 98–111)
Creatinine, Ser: 0.88 mg/dL (ref 0.61–1.24)
GFR calc Af Amer: >60 mL/min (ref >60)
GFR calc non Af Amer: >60 mL/min (ref >60)
Glucose, Bld: 123 mg/dL — ABNORMAL HIGH (ref 70–99)
Potassium: 3.7 mmol/L (ref 3.5–5.1)
Sodium: 129 mmol/L — ABNORMAL LOW (ref 135–145)

## 2019-10-01 LAB — COOXEMETRY PANEL
Carboxyhemoglobin: 1.8 % — ABNORMAL HIGH (ref 0.5–1.5)
Carboxyhemoglobin: 2.3 % — ABNORMAL HIGH (ref 0.5–1.5)
Carboxyhemoglobin: 2.6 % — ABNORMAL HIGH (ref 0.5–1.5)
Methemoglobin: 1.1 % (ref 0.0–1.5)
Methemoglobin: 0.8 % (ref 0.0–1.5)
Methemoglobin: 1.1 % (ref 0.0–1.5)
O2 Saturation: 43.3 %
O2 Saturation: 54.5 %
O2 Saturation: 87.1 %
Total hemoglobin: 10 g/dL — ABNORMAL LOW (ref 12.0–16.0)
Total hemoglobin: 10.1 g/dL — ABNORMAL LOW (ref 12.0–16.0)
Total hemoglobin: 10.1 g/dL — ABNORMAL LOW (ref 12.0–16.0)

## 2019-10-01 MED ORDER — TRAMADOL HCL 50 MG PO TABS: 50.0000 mg | ORAL_TABLET | Freq: Four times a day (QID) | ORAL | 0 refills | Status: AC | PRN

## 2019-10-01 MED ORDER — POTASSIUM CHLORIDE CRYS ER 20 MEQ PO TBCR: 20.0000 meq | EXTENDED_RELEASE_TABLET | Freq: Every day | ORAL | 0 refills | Status: DC | PRN

## 2019-10-01 MED ORDER — DIGOXIN 125 MCG PO TABS: 0.1250 mg | ORAL_TABLET | Freq: Every day | ORAL | 1 refills | Status: DC

## 2019-10-01 MED ORDER — AMIODARONE HCL 200 MG PO TABS: 200.0000 mg | ORAL_TABLET | Freq: Every day | ORAL | 1 refills | Status: DC

## 2019-10-01 MED ORDER — SPIRONOLACTONE 25 MG PO TABS: 12.5000 mg | ORAL_TABLET | Freq: Every day | ORAL | 1 refills | Status: DC

## 2019-10-01 MED ORDER — POTASSIUM CHLORIDE CRYS ER 20 MEQ PO TBCR
40.0000 meq | EXTENDED_RELEASE_TABLET | Freq: Once | ORAL | Status: AC
  Administered 2019-10-01: 40 meq via ORAL
  Filled 2019-10-01: qty 2

## 2019-10-01 MED ORDER — ASPIRIN 325 MG PO TBEC: 325.0000 mg | DELAYED_RELEASE_TABLET | Freq: Every day | ORAL | 0 refills | Status: DC

## 2019-10-01 MED ORDER — LOSARTAN POTASSIUM 25 MG PO TABS: 12.5000 mg | ORAL_TABLET | Freq: Every day | ORAL | 1 refills | Status: DC

## 2019-10-01 MED ORDER — FUROSEMIDE 40 MG PO TABS: 40.0000 mg | ORAL_TABLET | Freq: Every day | ORAL | 1 refills | Status: AC | PRN

## 2019-10-01 MED ORDER — LOPERAMIDE HCL 2 MG PO CAPS: 2.0000 mg | ORAL_CAPSULE | ORAL | 0 refills | Status: DC | PRN

## 2019-10-01 NOTE — Progress Notes (Signed)
12 Days Post-Op Procedure(s) (LRB): CORONARY ARTERY BYPASS GRAFTING (CABG) using LIMA to LAD; Endoscopic harvest right saphenous vein: SVG tp Diag; SVG sequential to OM1 and OM2. (N/A) TRANSESOPHAGEAL ECHOCARDIOGRAM (TEE) (N/A) Endovein Harvest Of Greater Saphenous Vein (Right) CLOSURE OF PATENT DUCTUS ARTERIOSUS (N/A) Subjective: Sitting up eating breakfast. Had "slight" lightheadedness with ambulation yesterday but none this morning. No new concerns.  Findings of MRI of brain noted.   Objective: Vital signs in last 24 hours: Temp:  [97.5 F (36.4 C)-98.8 F (37.1 C)] 97.7 F (36.5 C) (03/25 0738) Pulse Rate:  [75-87] 82 (03/25 0738) Cardiac Rhythm: Heart block;Bundle branch block (03/25 0700) Resp:  [20-29] 20 (03/24 2350) BP: (98-108)/(60-73) 98/60 (03/25 0738) SpO2:  [97 %-99 %] 98 % (03/25 0345) Weight:  [69.1 kg] 69.1 kg (03/25 0425)    Intake/Output from previous day: 03/24 0701 - 03/25 0700 In: 40 [I.V.:40] Out: 275 [Urine:275] Intake/Output this shift: No intake/output data recorded.  Physical Exam General appearance: alert, cooperative and no distress Neurologic: intact Heart: regular rate and rhythm Lungs: Breath sounds clear, O2 sat 98% on RA.  Extremities: All well perfused with no peripheral edema.  Wound: the sternotomy incision and RLE EVH incisions are intact and dry.   Lab Results: Recent Labs    10/01/19 0748  WBC 7.6  HGB 9.7*  HCT 29.6*  PLT 258   BMET:  Recent Labs    09/29/19 0336 09/30/19 0408  NA 129* 128*  K 3.8 3.8  CL 100 99  CO2 22 21*  GLUCOSE 90 92  BUN 16 14  CREATININE 0.75 0.75  CALCIUM 8.2* 8.1*    PT/INR: No results for input(s): LABPROT, INR in the last 72 hours. ABG    Component Value Date/Time   PHART 7.469 (H) 09/20/2019 1944   HCO3 23.2 09/20/2019 1944   TCO2 24 09/20/2019 1944   ACIDBASEDEF 2.0 09/20/2019 1814   O2SAT 54.5 10/01/2019 0748   CBG (last 3)  Recent Labs    09/28/19 1600  GLUCAP 101*     Assessment/Plan: S/P Procedure(s) (LRB): CORONARY ARTERY BYPASS GRAFTING (CABG) using LIMA to LAD; Endoscopic harvest right saphenous vein: SVG tp Diag; SVG sequential to OM1 and OM2. (N/A) TRANSESOPHAGEAL ECHOCARDIOGRAM (TEE) (N/A) Endovein Harvest Of Greater Saphenous Vein (Right) CLOSURE OF PATENT DUCTUS ARTERIOSUS (N/A)  -POD-11 urgent CABG for 99% LM stenosis and MVCAD presenting with cardiogenic shock. Initially supported with Impella that was removed 3/18. Milrinone off for 48 hours, CO Ox 78 -> 43 -> 54.  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  On statin, ASA, digoxin, losartan,spironolactone. Advanced HF team managing medications.   -Pulm- On RA with acceptable O2 sats. Small bilat effusions / ATX on CXR yesterday.  He does not appear volume overloaded.  Encouraging pulm hygiene.   -Renal- Stable creat, K+ 3.8.  -Dizziness / Lightheadedness- No Sx this am. 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   -Disposition- Progressing well. Ready for discharge from surgical standpoint but will defer to advanced heart failure team regarding timing. He lives alone but plans to stay with his sister temporarily who lives locally.    LOS: 13 days    Leary Roca, [place] [phone] 10/01/2019

## 2019-10-01 NOTE — Care Management Important Message (Signed)
Important Message  Patient Details  Name: Dennis Roberts MRN: [id number] Date of Birth: 1946-02-14   Medicare Important Message Given:        Mardene Sayer 10/01/2019, 3:44 PM   Unable to give IM before patient dischage. IM mailed

## 2019-10-01 NOTE — Progress Notes (Signed)
CARDIAC REHAB PHASE I   PRE:  Rate/Rhythm: 82 SR    BP: sitting 96/58    SaO2: 97 RA  MODE:  Ambulation: 500 ft   POST:  Rate/Rhythm: 91 SR    BP: sitting 121/59     SaO2: 98 RA  Tolerated very well with rollator. Tired toward end. Ed completed including sternal precautions, IS, diet, exercise, HF booklet, and CRPII. Will refer to [place] CRPII. Gave videos to view. [phone]   Harriet Masson CES, ACSM 10/01/2019 10:47 AM

## 2019-10-01 NOTE — Progress Notes (Signed)
Discharge instructions given to patient with sister at bedside, all new medications and appointments given to patient along with surgical incision care. Patient verbally understands  Instructions. 3 sutures removed from abdomen. Right FA IV removed, site clean dry and intact.

## 2019-10-01 NOTE — Discharge Instructions (Signed)
Discharge Instructions:  1. You may shower, please wash incisions daily with soap and water and keep dry.  If you wish to cover wounds with dressing you may do so but please keep clean and change daily.  No tub baths or swimming until incisions have completely healed.  If your incisions become red or develop any drainage please call our office at [phone]  2. No Driving until cleared by Dr. Vickey Sages office and you are no longer using narcotic pain medications  3. Monitor your weight daily.. Please use the same scale and weigh at same time.Marland KitchenMarland KitchenTake Lasix (furosemide) 40mg  and potasium chloride  by mouth daily as needed if you gain 2 pounds or more in 24 hours.  If you gain 5-10 lbs in 48 hours with associated lower extremity swelling, please contact our office at [phone]  4. Fever of 101.5 for at least 24 hours with no source, please contact our office at [phone]  5. Activity- up as tolerated, please walk at least 3 times per day.  Avoid strenuous activity, no lifting, pushing, or pulling with your arms over 8-10 lbs for a minimum of 6 weeks  6. If any questions or concerns arise, please do not hesitate to contact our office at [phone]

## 2019-10-01 NOTE — Progress Notes (Addendum)
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  - Impella removed 3/18  Milrinone discontinued 3/23. Co-ox marginal. Initial co-ox this morning was 43. Co-ox repeated and resulted at 55%.   Receive dose of IV Lasix yesterday but only 850 cc in UOP charted. Wt only down 1 lb. BMP pending. No dyspnea.   Brain MRI completed yesterday for dizziness and showed solitary small acute white matter lacunar infarct at the rightoperculum. No associated hemorrhage or mass effect.  No dizziness today. Only complaint is diarrhea. Colchicine and stool softeners discontinued. Now getting imodium. No abdominal pain . AF. WBC ct normal.   No cardiac complaints. Ambulated w/ PT yesterday w/o difficulty.   Objective:   Weight Range:  Vital Signs:   Temp:  [97.5 F (36.4 C)-98.8 F (37.1 C)] 97.7 F (36.5 C) (03/25 0738) Pulse Rate:  [75-87] 82 (03/25 0738) Resp:  [20-29] 20 (03/24 2350) BP: (98-108)/(60-73) 98/60 (03/25 0738) SpO2:  [97 %-99 %] 98 % (03/25 0345) Weight:  [69.1 kg] 69.1 kg (03/25 0425) Last BM Date: 09/30/19  Weight change: Filed Weights   09/29/19 0335 09/30/19 0655 10/01/19 0425  Weight: 69.8 kg 69.8 kg 69.1 kg    Intake/Output:   Intake/Output Summary (Last 24 hours) at 10/01/2019 0847 Last data filed at 10/01/2019 0420 Gross per 24 hour  Intake 40 ml  Output 275 ml  Net -235 ml    PHYSICAL EXAM: General:  Well appearing WM. Sitting up in bed No respiratory difficulty HEENT: normal anicteric  Neck: supple. no JVD. Carotids 2+ bilat; no bruits. No lymphadenopathy or thyromegaly appreciated. Cor: PMI nondisplaced. Regular rate & rhythm. No rubs, gallops or murmurs. Sternotomy site stable.  Lungs: clear no wheeze Abdomen: soft, nontender, nondistended. No hepatosplenomegaly. No bruits or masses. Good bowel  sounds. Extremities: no cyanosis, clubbing, rash, edema, RLL SVG harvest site stable, + TED hoses  Neuro: alert & oriented x 3, cranial nerves grossly intact. moves all 4 extremities w/o difficulty. Affect pleasant   Telemetry: NSR mid 80s  Personally reviewed   Labs: Basic Metabolic Panel: Recent Labs  Lab 09/26/19 1041 09/26/19 1041 09/27/19 0429 09/27/19 0429 09/28/19 0404 09/29/19 0336 09/30/19 0408  NA 128*  --  126*  --  129* 129* 128*  K 3.4*  --  3.5  --  4.0 3.8 3.8  CL 92*  --  92*  --  98 100 99  CO2 26  --  23  --  21* 22 21*  GLUCOSE 111*  --  117*  --  98 90 92  BUN 19  --  16  --  '17 16 14  '$ CREATININE 0.86  --  0.82  --  0.84 0.75 0.75  CALCIUM 8.4*   < > 8.1*   < > 8.2* 8.2* 8.1*   < > = values in this interval not displayed.    Liver Function Tests: No results for input(s): AST, ALT, ALKPHOS, BILITOT, PROT, ALBUMIN in the last 168 hours. No results for input(s): LIPASE, AMYLASE in the last 168 hours. No results for input(s): AMMONIA in the last 168 hours.  CBC: Recent Labs  Lab 09/25/19 0421 09/26/19 0402 09/27/19 0429 09/28/19 0404 10/01/19 0748  WBC 5.2 6.7 7.7 8.8 7.6  NEUTROABS 3.1 4.2 5.2 6.3  --   HGB 9.6*  9.8* 9.2* 9.2* 9.7*  HCT 28.2* 28.5* 27.7* 28.1* 29.6*  MCV 90.7 87.7 89.6 90.6 90.2  PLT 83* 124* 156 216 258    Cardiac Enzymes: No results for input(s): CKTOTAL, CKMB, CKMBINDEX, TROPONINI in the last 168 hours.  BNP: BNP (last 3 results) Recent Labs    09/18/19 1300  BNP 1,784.5*    ProBNP (last 3 results) No results for input(s): PROBNP in the last 8760 hours.    Other results:  Imaging: MR BRAIN W WO CONTRAST  Result Date: 09/30/2019 CLINICAL DATA:  74 year old male with ataxia. CABG earlier this month for severe coronary artery disease, cardiogenic shock, and severely depressed left ventricular function. Ataxia. EXAM: MRI HEAD WITHOUT AND WITH CONTRAST TECHNIQUE: Multiplanar, multiecho pulse sequences of the brain  and surrounding structures were obtained without and with intravenous contrast. CONTRAST:  76m GADAVIST GADOBUTROL 1 MMOL/ML IV SOLN COMPARISON:  None. FINDINGS: Brain: There is a solitary 4 mm focus of restricted diffusion in the central the subcortical white matter of the right operculum (series 3, image 37). Minimal T2 and FLAIR hyperintensity at that site, no associated hemorrhage or mass effect. No other restricted diffusion. But there is scattered and patchy bilateral white matter T2 and FLAIR hyperintensity, moderately advanced for age. No superimposed cortical encephalomalacia. Possible chronic microhemorrhage in the left frontal lobe on series 8, image 18. But no other chronic cerebral blood products identified. The deep gray nuclei, brainstem and cerebellum are normal. Normal for age cerebral volume. No abnormal enhancement identified. No dural thickening. midline shift, mass effect, evidence of mass lesion, ventriculomegaly, extra-axial collection or acute intracranial hemorrhage. Cervicomedullary junction and pituitary are within normal limits. Vascular: Major intracranial vascular flow voids are preserved. The major dural venous sinuses appear to be enhancing and patent. Skull and upper cervical spine: Negative for age visible cervical spine. Visualized bone marrow signal is within normal limits. Sinuses/Orbits: Negative orbits. Paranasal sinuses are clear. Other: Mastoids are clear. Visible internal auditory structures appear normal. Scalp and face soft tissues appear negative. IMPRESSION: 1. Solitary small acute white matter lacunar infarct at the right operculum. No associated hemorrhage or mass effect. 2. Underlying moderately advanced for age bilateral white matter signal changes, nonspecific but most commonly due to chronic small vessel disease. Electronically Signed   By: HGenevie AnnM.D.   On: 09/30/2019 18:37     Medications:     Scheduled Medications: . amiodarone  200 mg Oral BID  .  aspirin EC  325 mg Oral Daily   Or  . aspirin  324 mg Per Tube Daily  . chlorhexidine gluconate (MEDLINE KIT)  15 mL Mouth Rinse BID  . Chlorhexidine Gluconate Cloth  6 each Topical Daily  . [facility], Patient & Family Education   Does not apply Once  . digoxin  0.125 mg Oral Daily  . enoxaparin (LOVENOX) injection  30 mg Subcutaneous Q24H  . furosemide  40 mg Oral Once  . losartan  12.5 mg Oral QHS  . pantoprazole  40 mg Oral Daily  . potassium chloride  40 mEq Oral Once  . sodium chloride flush  10-40 mL Intracatheter Q12H  . sodium chloride flush  3 mL Intravenous Q12H  . sorbitol  30 mL Oral Once  . spironolactone  25 mg Oral QHS    Infusions:  . sodium chloride 10 mL/hr at 09/20/19 1900  . sodium chloride      PRN Medications: sodium chloride, acetaminophen, ALPRAZolam, lip balm, loperamide, midazolam, morphine  injection, ondansetron (ZOFRAN) IV, oxyCODONE, sodium chloride flush, sodium chloride flush, sodium chloride flush, traMADol, zolpidem   Assessment/Plan:   1. Acute systolic HF ->Cardiogenic shock - 09/18/19: Echo EF 20% RV ok - 09/18/19: Cath 3v CAD with 99% LM. Required Impella support - Impella out 3/18. Swan out 3/19 - Co-ox marginal, 56% today. ? If accurate (79% yesterday). Will repeat co-ox - Volume status stable on exam. Would send home on PRN Lasix 40 mg.  - Continue digoxin 0.125 mg daily.  - Continue 12.5 mg spironolactone daily.  - Continue losartan 12.5 daily for now.  - BP too soft for further med titration currently   2. Unstable angina with 3v-CAD - 09/18/19 cath with 99% LM - Underwent CABG 3/13 (LIMA to LAD; SVG to Diag; SVG sequential to OM1 and OM2) - No s/s ischemia - On ASA, statin - BP too soft for  blocker  3. Acute hypoxic respiratory failure - Extubated 3/14. Sats stable on room air.  - No change. Encourage IS   4. Expected post-op blood loss - hgb stable ~9. Transfuse as needed to keep > 7.5  5,  Hyponatremia: - Na 128 -> 126->129->128->129. mentation ok. Limit free h20  6. GI: Constipation/Diarrhea  - resolved w/ sorbitol. Now w/ diarrhea. AF. WBC nl  - colchicine and stool softeners discontinued - getting imodium   7. Deconditioning - doing ok w/ PT - no home PT required per assessment   8. Post-op Afib - maintaining NSR on PO amiodarone. No recurrence - not on a/c - reduce amiodarone down to 200 mg once daily. If no afib at post hospital f/u, can likely disconitnue  Dispo: ok for d/c home from HF standpoint. We will arrange [facility] f/u.  Appt info in AVS  Discharge Meds for Home  Amiodarone 200 mg qd ASA 325 mg Digoxin 0.125 Losartan 12.5 mg qhs Spiro 12.5 mg qhs Lasix 40 mg PRN KCl 20 mEq PRN w/ Lasix     Length of Stay: 13   Brittainy Simmons PA-C 10/01/2019, 8:47 AM  Advanced Heart Failure Team Pager [phone] (M-F; 7a - 4p)  Please contact [facility] for night-coverage after hours (4p -7a ) and weekends on [email or web address]  Dizziness improved. MRI brain ok. Volume status ok. Co-ox marginal but stable.   Ok for d/c from my standpoint with close f/u in HF Clinic.   D/c meds as above. Reviewed personally.  Glori Bickers, MD  12:03 PM

## 2019-10-01 NOTE — Plan of Care (Signed)
  Problem: Education: Goal: Knowledge of General Education information will improve Description: Including pain rating scale, medication(s)/side effects and non-pharmacologic comfort measures Outcome: Adequate for Discharge   Problem: Health Behavior/Discharge Planning: Goal: Ability to manage health-related needs will improve Outcome: Adequate for Discharge   Problem: Clinical Measurements: Goal: Ability to maintain clinical measurements within normal limits will improve Outcome: Adequate for Discharge Goal: Will remain free from infection Outcome: Adequate for Discharge Goal: Diagnostic test results will improve Outcome: Adequate for Discharge Goal: Cardiovascular complication will be avoided Outcome: Adequate for Discharge   Problem: Activity: Goal: Risk for activity intolerance will decrease Outcome: Adequate for Discharge   Problem: Nutrition: Goal: Adequate nutrition will be maintained Outcome: Adequate for Discharge   Problem: Coping: Goal: Level of anxiety will decrease Outcome: Adequate for Discharge   Problem: Elimination: Goal: Will not experience complications related to bowel motility Outcome: Adequate for Discharge Goal: Will not experience complications related to urinary retention Outcome: Adequate for Discharge   Problem: Pain Managment: Goal: General experience of comfort will improve Outcome: Adequate for Discharge   Problem: Safety: Goal: Ability to remain free from injury will improve Outcome: Adequate for Discharge   Problem: Skin Integrity: Goal: Risk for impaired skin integrity will decrease Outcome: Adequate for Discharge   Problem: Education: Goal: Will demonstrate proper wound care and an understanding of methods to prevent future damage Outcome: Adequate for Discharge Goal: Knowledge of disease or condition will improve Outcome: Adequate for Discharge Goal: Knowledge of the prescribed therapeutic regimen will improve Outcome: Adequate  for Discharge Goal: Individualized Educational Video(s) Outcome: Adequate for Discharge   Problem: Activity: Goal: Risk for activity intolerance will decrease Outcome: Adequate for Discharge   Problem: Cardiac: Goal: Will achieve and/or maintain hemodynamic stability Outcome: Adequate for Discharge   Problem: Clinical Measurements: Goal: Postoperative complications will be avoided or minimized Outcome: Adequate for Discharge   Problem: Respiratory: Goal: Respiratory status will improve Outcome: Adequate for Discharge   Problem: Skin Integrity: Goal: Wound healing without signs and symptoms of infection Outcome: Adequate for Discharge Goal: Risk for impaired skin integrity will decrease Outcome: Adequate for Discharge   Problem: Urinary Elimination: Goal: Ability to achieve and maintain adequate renal perfusion and functioning will improve Outcome: Adequate for Discharge

## 2019-10-05 ENCOUNTER — Other Ambulatory Visit: Payer: Self-pay | Admitting: *Deleted

## 2019-10-05 DIAGNOSIS — Z79899 Other long term (current) drug therapy: Secondary | ICD-10-CM

## 2019-10-08 ENCOUNTER — Other Ambulatory Visit: Payer: Self-pay

## 2019-10-08 ENCOUNTER — Encounter (HOSPITAL_COMMUNITY): Payer: Self-pay

## 2019-10-08 ENCOUNTER — Telehealth (HOSPITAL_COMMUNITY): Payer: Self-pay

## 2019-10-08 ENCOUNTER — Ambulatory Visit (HOSPITAL_COMMUNITY)
Admission: RE | Admit: 2019-10-08 | Discharge: 2019-10-08 | Disposition: A | Discharge status: Home / Self Care | Payer: Medicare Other | Source: Ambulatory Visit | Attending: Cardiology | Admitting: Cardiology

## 2019-10-08 ENCOUNTER — Other Ambulatory Visit: Payer: Self-pay | Admitting: Cardiothoracic Surgery

## 2019-10-08 VITALS — BP 118/68 | HR 90 | Ht 65.5 in | Wt 155.2 lb

## 2019-10-08 DIAGNOSIS — Z880 Allergy status to penicillin: Secondary | ICD-10-CM | POA: Insufficient documentation

## 2019-10-08 DIAGNOSIS — I4891 Unspecified atrial fibrillation: Secondary | ICD-10-CM | POA: Diagnosis not present

## 2019-10-08 DIAGNOSIS — Z87891 Personal history of nicotine dependence: Secondary | ICD-10-CM | POA: Insufficient documentation

## 2019-10-08 DIAGNOSIS — I5022 Chronic systolic (congestive) heart failure: Secondary | ICD-10-CM | POA: Diagnosis not present

## 2019-10-08 DIAGNOSIS — I2510 Atherosclerotic heart disease of native coronary artery without angina pectoris: Secondary | ICD-10-CM

## 2019-10-08 DIAGNOSIS — I255 Ischemic cardiomyopathy: Secondary | ICD-10-CM | POA: Diagnosis not present

## 2019-10-08 DIAGNOSIS — Z7982 Long term (current) use of aspirin: Secondary | ICD-10-CM | POA: Insufficient documentation

## 2019-10-08 DIAGNOSIS — Z79899 Other long term (current) drug therapy: Secondary | ICD-10-CM | POA: Insufficient documentation

## 2019-10-08 DIAGNOSIS — I480 Paroxysmal atrial fibrillation: Secondary | ICD-10-CM | POA: Diagnosis not present

## 2019-10-08 DIAGNOSIS — Z951 Presence of aortocoronary bypass graft: Secondary | ICD-10-CM

## 2019-10-08 DIAGNOSIS — Z882 Allergy status to sulfonamides status: Secondary | ICD-10-CM | POA: Insufficient documentation

## 2019-10-08 DIAGNOSIS — Z8249 Family history of ischemic heart disease and other diseases of the circulatory system: Secondary | ICD-10-CM | POA: Diagnosis not present

## 2019-10-08 LAB — BASIC METABOLIC PANEL
Anion gap: 8 (ref 5–15)
BUN: 21 mg/dL (ref 8–23)
CO2: 27 mmol/L (ref 22–32)
Calcium: 9 mg/dL (ref 8.9–10.3)
Chloride: 101 mmol/L (ref 98–111)
Creatinine, Ser: 0.88 mg/dL (ref 0.61–1.24)
GFR calc Af Amer: >60 mL/min (ref >60)
GFR calc non Af Amer: >60 mL/min (ref >60)
Glucose, Bld: 84 mg/dL (ref 70–99)
Potassium: 4.3 mmol/L (ref 3.5–5.1)
Sodium: 136 mmol/L (ref 135–145)

## 2019-10-08 LAB — DIGOXIN LEVEL: Digoxin Level: 0.9 ng/mL (ref 0.8–2.0)

## 2019-10-08 MED ORDER — POTASSIUM CHLORIDE CRYS ER 20 MEQ PO TBCR: 20.0000 meq | EXTENDED_RELEASE_TABLET | ORAL | 3 refills | Status: AC | PRN

## 2019-10-08 MED ORDER — SPIRONOLACTONE 25 MG PO TABS: 25.0000 mg | ORAL_TABLET | Freq: Every day | ORAL | 1 refills | Status: DC

## 2019-10-08 NOTE — Telephone Encounter (Signed)
Pt insurance is active and benefits verified through [facility] Medicare. Co-pay $0.00, DED $0.00/$0.00 met, out of pocket $4,500.00/$265.00 met, co-insurance 0%. No pre-authorization required. Passport, 10/08/2019 @ 9:52AM, SPJ#[id number]  Will contact patient to see if he is interested in the Cardiac Rehab Program. If interested, patient will need to complete follow up appt. Once completed, patient will be contacted for scheduling upon review by the RN Navigator.

## 2019-10-08 NOTE — Progress Notes (Signed)
Advanced Heart Failure Clinic Note   Referring Physician: PCP: Patient, No Pcp Per PCP-Cardiologist: Dr. Anne Fu AHF: Dr. Gala Romney  CT Surgery: Dr. Renaldo Fiddler   Reason for Visit: [facility] F/u for Systolic Heart Failure/ ICM   HPI:  74 y/o male with no significant past medical history recently admitted to [facility] 09/2019 w/ acute systolic HF>>cardiogenic shock in the setting of severe multivessel CAD.   He had presented to the ER with a several day history of progressive shortness of breath.  He was seen in the ER by Dr. Anne Fu and felt to have cardiogenic shock in the setting of probable multivessel coronary artery disease.  He developed progressive respiratory distress and was brought emergently to the catheterization lab. Started milrinone 0.25, given IV Lasix and started BiPAP. Did not require emergent intubation. EF <20% by echo. Urgent cardiac catheterization showed severe three-vessel disease with 99% left main.  Right heart cath showed low output with marked volume overload.  Impella was placed in cath lab for mechanical support.  Underwent CABG 3/13 (LIMA to LAD; SVG to Diag; SVG sequential to OM1 and OM2) by Dr. Renaldo Fiddler. He was continued on milrinone post operatively and able to wean off w/ stable co-ox. Was placed on GDMT for systolic HF. Able to tolerate low dose losartan, spiro and digoxin, however upward titration was limited by borderline hypotension. He had some post operative atrial fibrillation treated w/ amiodarone however no other significant post operative complications. He progressed well and was discharged home on on 3/25. He was evaluated by PT prior to d/c and no home health PT/OT needed.    He presents today for post hospital f/u. Here with his sister. Doing well. Denies CP and no dyspnea. Ambulating w/o difficulty. NYHA Class II. Compliant w/ meds. Tolerating ok. BP 118/68 today. No orthostatic symptoms. Maintain NSR w/ amiodarone. Denies palpitations. Wt has been stable at  home, 149-150 lb. Has only had to use PNR lasix once since discharge. He has f/u w/ Dr. Renaldo Fiddler next week.    Review of Systems: [y] = yes, [ ]  = no   General: Weight gain [ ] ; Weight loss [ ] ; Anorexia [ ] ; Fatigue [ ] ; Fever [ ] ; Chills [ ] ; Weakness [ ]   Cardiac: Chest pain/pressure [ ] ; Resting SOB [ ] ; Exertional SOB [ ] ; Orthopnea [ ] ; Pedal Edema [ ] ; Palpitations [ ] ; Syncope [ ] ; Presyncope [ ] ; Paroxysmal nocturnal dyspnea[ ]   Pulmonary: Cough [ ] ; Wheezing[ ] ; Hemoptysis[ ] ; Sputum [ ] ; Snoring [ ]   GI: Vomiting[ ] ; Dysphagia[ ] ; Melena[ ] ; Hematochezia [ ] ; Heartburn[ ] ; Abdominal pain [ ] ; Constipation [ ] ; Diarrhea [ ] ; BRBPR [ ]   GU: Hematuria[ ] ; Dysuria [ ] ; Nocturia[ ]   Vascular: Pain in legs with walking [ ] ; Pain in feet with lying flat [ ] ; Non-healing sores [ ] ; Stroke [ ] ; TIA [ ] ; Slurred speech [ ] ;  Neuro: Headaches[ ] ; Vertigo[ ] ; Seizures[ ] ; Paresthesias[ ] ;Blurred vision [ ] ; Diplopia [ ] ; Vision changes [ ]   Ortho/Skin: Arthritis [ ] ; Joint pain [ ] ; Muscle pain [ ] ; Joint swelling [ ] ; Back Pain [ ] ; Rash [ ]   Psych: Depression[ ] ; Anxiety[ ]   Heme: Bleeding problems [ ] ; Clotting disorders [ ] ; Anemia [ ]   Endocrine: Diabetes [ ] ; Thyroid dysfunction[ ]    No past medical history on file.  Current Outpatient Medications  Medication Sig Dispense Refill  . amiodarone (PACERONE) 200 MG tablet Take 1 tablet (200 mg total)  by mouth daily. 30 tablet 1  . aspirin EC 325 MG EC tablet Take 1 tablet (325 mg total) by mouth daily. 30 tablet 0  . digoxin (LANOXIN) 0.125 MG tablet Take 1 tablet (0.125 mg total) by mouth daily. 30 tablet 1  . furosemide (LASIX) 40 MG tablet Take 1 tablet (40 mg total) by mouth daily as needed (for weight gain of 2 pounds or more in 24 hours). 15 tablet 1  . lactobacillus acidophilus (BACID) TABS tablet Take 1 tablet by mouth daily.     Marland Kitchen losartan (COZAAR) 25 MG tablet Take 0.5 tablets (12.5 mg total) by mouth at bedtime. 30 tablet 1  .  Multiple Vitamin (MULTIVITAMIN) capsule Take 1 capsule by mouth daily.    Marland Kitchen OVER THE COUNTER MEDICATION Apply 1 application topically daily as needed (pain). CBD cream    . spironolactone (ALDACTONE) 25 MG tablet Take 0.5 tablets (12.5 mg total) by mouth at bedtime. 30 tablet 1  . traMADol (ULTRAM) 50 MG tablet Take 1 tablet (50 mg total) by mouth every 6 (six) hours as needed for up to 7 days for moderate pain. 28 tablet 0   No current facility-administered medications for this encounter.    Allergies  Allergen Reactions  . Penicillins   . Sulfa Antibiotics       Social History   Socioeconomic History  . Marital status: Divorced    Spouse name: Not on file  . Number of children: Not on file  . Years of education: Not on file  . Highest education level: Not on file  Occupational History  . Not on file  Tobacco Use  . Smoking status: Former Smoker    Quit date: 1974    Years since quitting: 47.2  . Smokeless tobacco: Former Network engineer and Sexual Activity  . Alcohol use: Not Currently    Comment: occ  . Drug use: Never  . Sexual activity: Not on file  Other Topics Concern  . Not on file  Social History Narrative  . Not on file   Social Determinants of Health   Financial Resource Strain:   . Difficulty of Paying Living Expenses:   Food Insecurity:   . Worried About Charity fundraiser in the Last Year:   . Arboriculturist in the Last Year:   Transportation Needs:   . Film/video editor (Medical):   Marland Kitchen Lack of Transportation (Non-Medical):   Physical Activity:   . Days of Exercise per Week:   . Minutes of Exercise per Session:   Stress:   . Feeling of Stress :   Social Connections:   . Frequency of Communication with Friends and Family:   . Frequency of Social Gatherings with Friends and Family:   . Attends Religious Services:   . Active Member of Clubs or Organizations:   . Attends Archivist Meetings:   Marland Kitchen Marital Status:   Intimate Partner  Violence:   . Fear of Current or Ex-Partner:   . Emotionally Abused:   Marland Kitchen Physically Abused:   . Sexually Abused:       Family History  Problem Relation Age of Onset  . Healthy Mother   . Cancer Father   . Heart disease Father        bypass at 71    Vitals:   10/08/19 0938  BP: 118/68  Pulse: 90  SpO2: 100%  Weight: 70.4 kg  Height: 5' 5.5" (1.664 m)  PHYSICAL EXAM: General:  Well appearing. No respiratory difficulty HEENT: normal Neck: supple. no JVD. Carotids 2+ bilat; no bruits. No lymphadenopathy or thyromegaly appreciated. Cor: PMI nondisplaced. Regular rate & rhythm. No rubs, gallops or murmurs. Lungs: clear Abdomen: soft, nontender, nondistended. No hepatosplenomegaly. No bruits or masses. Good bowel sounds. Extremities: no cyanosis, clubbing, rash, edema Neuro: alert & oriented x 3, cranial nerves grossly intact. moves all 4 extremities w/o difficulty. Affect pleasant.  ECG: NSR w/ 1st degree AVB 87 bpm    ASSESSMENT & PLAN:  1. 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 Recent Admit 09/2019 for acute CHF/Cardiogenic Shock 2/2 severe multivessel CAD  - 09/18/19: Echo EF 20% RV ok - 09/18/19: Cath 3v CAD with 99% LM. Required Impella support, inotropic therapy + CABG - 09/20/19: Limited Echo post CABG EF 20-25%  - Doing well post discharge. NYHA II. Euvolemic on exam. Wt stable  - Continue Lasix + KCl on PRN basis  - Continue digoxin 0.125 mg daily. Check dig level today  - Increase Spironolactone to 25 mg daily   - Continue losartan 12.5 daily for now. BP to soft for further titration/Entresto  - Check BMP today - Frequent f/us q 2-3 weeks for med titration - Plan repeat echo in 3 months. Refer to EP for ICD if EF remains < 35% - Advised to continue w/ daily wts   2. CAD - 09/18/19 cath with 3VD w/ LM involvement w/ 99%  - Underwent CABG 3/13 (LIMA to LAD; SVG to Diag; SVG sequential to OM1 and OM2) - No s/s ischemia  - On ASA,  statin - BP too soft for ? blocker - Begin CR once cleared by CT surgery (has visit w/ Dr. Renaldo Fiddler next week)  3. Post-op Afib - maintaining NSR on PO amiodarone. HR 80s - continue amiodarone 200 mg daily x at least 4 more weeks - if still in NSR on return visit, can stop amiodarone   F/u w/ pharmD in 2 weeks, APP in 4-6 weeks for further titration of HF regimen    Robbie Lis, PA-C 10/08/19

## 2019-10-08 NOTE — Patient Instructions (Signed)
INCREASE Spironolactone to 25 mg, one tab daily RESTART Potassium 20 meq one tab with every dose of Lasix  Labs today We will only contact you if something comes back abnormal or we need to make some changes. Otherwise no news is good news!  Your physician recommends that you schedule a follow-up appointment in: 2 weeks  Do the following things EVERYDAY: 1) Weigh yourself in the morning before breakfast. Write it down and keep it in a log. 2) Take your medicines as prescribed 3) Eat low salt foods--Limit salt (sodium) to 2000 mg per day.  4) Stay as active as you can everyday 5) Limit all fluids for the day to less than 2 liters  At the Advanced Heart Failure Clinic, you and your health needs are our priority. As part of our continuing mission to provide you with exceptional heart care, we have created designated Provider Care Teams. These Care Teams include your primary Cardiologist (physician) and Advanced Practice Providers (APPs- Physician Assistants and Nurse Practitioners) who all work together to provide you with the care you need, when you need it.   You may see any of the following providers on your designated Care Team at your next follow up: Marland Kitchen Dr Arvilla Meres . Dr Marca Ancona . Tonye Becket, NP . Robbie Lis, PA . Karle Plumber, PharmD   Please be sure to bring in all your medications bottles to every appointment.

## 2019-10-08 NOTE — Telephone Encounter (Signed)
Attempted to call patient in regards to Cardiac Rehab - LM on VM 

## 2019-10-09 ENCOUNTER — Other Ambulatory Visit: Payer: Self-pay | Admitting: Physician Assistant

## 2019-10-12 ENCOUNTER — Ambulatory Visit (INDEPENDENT_AMBULATORY_CARE_PROVIDER_SITE_OTHER): Payer: Self-pay | Admitting: Cardiothoracic Surgery

## 2019-10-12 ENCOUNTER — Other Ambulatory Visit: Payer: Self-pay | Admitting: Cardiothoracic Surgery

## 2019-10-12 ENCOUNTER — Ambulatory Visit
Admission: RE | Admit: 2019-10-12 | Discharge: 2019-10-12 | Disposition: A | Discharge status: Home / Self Care | Payer: Medicare Other | Source: Ambulatory Visit | Attending: Cardiothoracic Surgery | Admitting: Cardiothoracic Surgery

## 2019-10-12 ENCOUNTER — Other Ambulatory Visit: Payer: Self-pay

## 2019-10-12 VITALS — BP 120/54 | HR 93 | Temp 97.5°F | Resp 20 | Ht 65.0 in | Wt 154.2 lb

## 2019-10-12 DIAGNOSIS — J9811 Atelectasis: Secondary | ICD-10-CM | POA: Diagnosis not present

## 2019-10-12 DIAGNOSIS — Z951 Presence of aortocoronary bypass graft: Secondary | ICD-10-CM

## 2019-10-12 DIAGNOSIS — J90 Pleural effusion, not elsewhere classified: Secondary | ICD-10-CM

## 2019-10-12 NOTE — Progress Notes (Signed)
      [address]       [place] [address]             [phone]     CARDIOTHORACIC SURGERY OFFICE NOTE  Referring Provider is Lorre Nick, MD Primary Cardiologist is No primary care provider on file. PCP is Patient, No Pcp Per   HPI:  74 yo man underwent urgent CABG for severe multivessel CAD and depressed EF.  He did well after surgery and now presents for 1st outpatient visit.    Current Outpatient Medications  Medication Sig Dispense Refill  . amiodarone (PACERONE) 200 MG tablet Take 1 tablet (200 mg total) by mouth daily. 30 tablet 1  . aspirin EC 325 MG EC tablet Take 1 tablet (325 mg total) by mouth daily. 30 tablet 0  . digoxin (LANOXIN) 0.125 MG tablet Take 1 tablet (0.125 mg total) by mouth daily. 30 tablet 1  . furosemide (LASIX) 40 MG tablet Take 1 tablet (40 mg total) by mouth daily as needed (for weight gain of 2 pounds or more in 24 hours). 15 tablet 1  . lactobacillus acidophilus (BACID) TABS tablet Take 1 tablet by mouth daily.     Marland Kitchen losartan (COZAAR) 25 MG tablet Take 0.5 tablets (12.5 mg total) by mouth at bedtime. 30 tablet 1  . Multiple Vitamin (MULTIVITAMIN) capsule Take 1 capsule by mouth daily.    Marland Kitchen OVER THE COUNTER MEDICATION Apply 1 application topically daily as needed (pain). CBD cream    . potassium chloride SA (KLOR-CON) 20 MEQ tablet Take 1 tablet (20 mEq total) by mouth as needed. With every dose of lasix 90 tablet 3  . spironolactone (ALDACTONE) 25 MG tablet Take 1 tablet (25 mg total) by mouth at bedtime. 30 tablet 1   No current facility-administered medications for this visit.      Physical Exam:   BP (!) 120/54 (BP Location: Left Arm, Patient Position: Sitting, Cuff Size: Normal)   Pulse 93   Temp (!) 97.5 F (36.4 C) (Temporal)   Resp 20   Ht 5\' 5"  (1.651 m)   Wt 69.9 kg   SpO2 97% Comment: RA  BMI 25.66 kg/m   General:  Well-appearing, NAD  Chest:   cta except at left  base  CV:   rrr  Incisions:  Well-healed  Abdomen:  sntnd  Extremities:  No edema  Diagnostic Tests:  CXR with moderate left pleural effusion   Impression:  Doing well after CABG for depressed LV function  Plan:  Refer to radiology for image-guided drainage of left pleural effusion.  F/u in 2 weeks with repeat cxr Continue to observe sternal precautions  I spent in excess of 15 minutes during the conduct of this office consultation and >50% of this time involved direct face-to-face encounter with the patient for counseling and/or coordination of their care.  Level 2                 10 minutes Level 3                 15 minutes Level 4                 25 minutes Level 5                 40 minutes  B. , MD 10/12/2019 11:17 AM

## 2019-10-13 ENCOUNTER — Other Ambulatory Visit (HOSPITAL_COMMUNITY)
Admission: RE | Admit: 2019-10-13 | Discharge: 2019-10-13 | Disposition: A | Discharge status: Home / Self Care | Payer: Medicare Other | Source: Ambulatory Visit | Attending: Cardiothoracic Surgery | Admitting: Cardiothoracic Surgery

## 2019-10-13 DIAGNOSIS — Z01812 Encounter for preprocedural laboratory examination: Secondary | ICD-10-CM | POA: Diagnosis not present

## 2019-10-13 DIAGNOSIS — Z20822 Contact with and (suspected) exposure to covid-19: Secondary | ICD-10-CM | POA: Insufficient documentation

## 2019-10-13 LAB — SARS CORONAVIRUS 2 (TAT 6-24 HRS): SARS Coronavirus 2: NEGATIVE

## 2019-10-16 ENCOUNTER — Ambulatory Visit (HOSPITAL_COMMUNITY)
Admission: RE | Admit: 2019-10-16 | Discharge: 2019-10-16 | Disposition: A | Discharge status: Home / Self Care | Payer: Medicare Other | Source: Ambulatory Visit | Attending: Cardiothoracic Surgery | Admitting: Cardiothoracic Surgery

## 2019-10-16 ENCOUNTER — Other Ambulatory Visit: Payer: Self-pay | Admitting: Cardiothoracic Surgery

## 2019-10-16 ENCOUNTER — Other Ambulatory Visit: Payer: Self-pay

## 2019-10-16 DIAGNOSIS — J90 Pleural effusion, not elsewhere classified: Secondary | ICD-10-CM | POA: Diagnosis not present

## 2019-10-16 DIAGNOSIS — Z951 Presence of aortocoronary bypass graft: Secondary | ICD-10-CM

## 2019-10-16 DIAGNOSIS — R918 Other nonspecific abnormal finding of lung field: Secondary | ICD-10-CM | POA: Diagnosis not present

## 2019-10-16 HISTORY — PX: IR THORACENTESIS ASP PLEURAL SPACE W/IMG GUIDE: IMG5380

## 2019-10-16 MED ORDER — LIDOCAINE HCL (PF) 1 % IJ SOLN
INTRAMUSCULAR | Status: AC | PRN
  Administered 2019-10-16: 10 mL

## 2019-10-16 MED ORDER — LIDOCAINE HCL 1 % IJ SOLN
INTRAMUSCULAR | Status: AC
  Filled 2019-10-16: qty 20

## 2019-10-16 NOTE — Procedures (Signed)
PROCEDURE SUMMARY:  Successful US guided diagnostic and therapeutic left thoracentesis. Yielded 1.4 liters of dark, blood-tinged fluid. Pt tolerated procedure well. No immediate complications.  Specimen was not sent for labs. CXR ordered.  EBL < 5 mL  Hoyt Koch PA-C 10/16/2019 10:08 AM

## 2019-10-16 NOTE — Progress Notes (Signed)
PCP: Patient, No Pcp Per PCP-Cardiologist: Dr. Marlou Porch AHF: Dr. Haroldine Laws  CT Surgery: Dr. Orvan Seen   HPI:  74 y/o male with no significantpast medical history recently admitted to [facility] 12/5991 w/ acute systolic HF>>cardiogenic shock in the setting of severe multivessel CAD.   He had presented to the ER with a several day history of progressive shortness of breath. He was seen in the ER by Dr. Marlou Porch and felt to have cardiogenic shock in the setting of probable multivessel coronary artery disease. He developed progressive respiratory distress and was brought emergently to the catheterization lab.Started milrinone 0.25, given IV Lasix and started BiPAP. Did not require emergent intubation. EF <20% by echo. Urgent cardiac catheterization showed severe three-vessel disease with 99% left main. Right heart cath showed low output with marked volume overload. Impella was placed in cath lab for mechanical support.  Underwent CABG 3/13 (LIMA to LAD; SVG to Diag; SVG sequential to OM1 and OM2) by Dr. Julien Girt. He was continued on milrinone post operatively and able to wean off w/ stable co-ox. Was placed on GDMT for systolic HF. Able to tolerate low dose losartan, spiro and digoxin, however upward titration was limited by borderline hypotension. He had some post operative atrial fibrillation treated w/ amiodarone however no other significant post operative complications. He progressed well and was discharged home on on 10/01/19. He was evaluated by PT prior to d/c and no home health PT/OT needed.    Recently presented to HF Clinic with Lyda Jester, PA-C on 10/08/19. Came with his sister. Doing well. Denied CP and no dyspnea. Able to ambulate w/o difficulty. NYHA Class II. Compliant w/ meds. Tolerating ok. BP 118/68 in clinic. No orthostatic symptoms. Maintaining NSR w/ amiodarone. Denied palpitations. Weight had been stable at home, 149-150 lb. Had only had to use PRN furosemide once since discharge. He had  f/u w/ Dr. Julien Girt the following week.  Today he returns to HF clinic for pharmacist medication titration. At last visit with PA-C, spironolactone was increased to 25 mg daily. He also saw Dr. Orvan Seen in clinic on 10/26/19 where amiodarone was discontinued. Overall he is feeling well today. No dizziness, lightheadedness, chest pain or palpitations. No SOB/DOE. His weight at home has been stable at 147-148 lbs. He has not needed any furosemide PRN. No LEE, PND or orthopnea. His appetite is good and he follows a low sodium diet. Taking all medications as prescribed and tolerating all medications.    HF Medications: Losartan 12.5 mg daily Digoxin 0.125 mg daily Spironolactone 25 mg daily Furosemide 40 mg PRN  Has the patient been experiencing any side effects to the medications prescribed?  no  Does the patient have any problems obtaining medications due to transportation or finances?   No - has [employer]ARP Medicare Part D  Understanding of regimen: good Understanding of indications: fair Potential of compliance: good Patient understands to avoid NSAIDs. Patient understands to avoid decongestants.    Pertinent Lab Values: . Serum creatinine 0.96, BUN 24, Potassium 5.2, Sodium 138  Vital Signs: . Weight: 151.6 lbs (last clinic weight: 154 lbs) . Blood pressure: 122/76  . Heart rate: 79   Assessment: 1. 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 Recent Admit 09/2019 for acute CHF/Cardiogenic Shock 2/2 severe multivessel CAD  - 09/18/19: Echo EF 20% RV ok - 09/18/19: Cath 3v CAD with 99% LM. Required Impella support, inotropic therapy + CABG - 09/20/19: Limited Echo post CABG EF 20-25% - NYHA II. Euvolemic on exam.  -  Labs: Scr increased from 0.88 to 0.96, K increased from 4.3 to 5.2. Will have him follow a low potassium diet (has recently started drinking lots of orange juice) and decrease spironolactone to 12.5 mg daily. Repeat BMET next week.  -Vitals: low BP previously  limited up-titration of GDMT, but has been 120/60-70s on recent checks in office and home. BP is 122/76 in clinic today. HR 79. - Continue furosemide 40 mg PRN - Start carvedilol 3.125 mg BID.  - Continue losartan 12.5 mg daily. Plan to increase next visit as labs and BP allow.  - Decrease Spironolactone to 12.5 mg daily given K of 5.2 as above. Repeat BMET in 1 week.  - Continue digoxin 0.125 mg daily. Last digoxin level 0.9 ng/mL on 10/08/19 (not a true trough) - Plan repeat echo in 3 months. Refer to EP for ICD if EF remains < 35%  2. CAD - 09/18/19 cath with 3VD w/ LM involvement w/ 99%  - Underwent CABG 3/13 (LIMA to LAD; SVG to Diag; SVG sequential to OM1 and OM2) - No s/s ischemia  - On ASA, clopidogrel, statin - start carvedilol as above  3. Post-op Afib - Amiodarone discontinued on 10/26/19 by Dr. Vickey Sages.     Plan: 1) Medication changes: Based on clinical presentation, vital signs and recent labs will start carvedilol 3.125 mg BID. Labs returned after visit was complete showing K elevated at 5.2. Called patient and had him start following a low K diet in addition to decreasing spironolactone to 12.5 mg daily. Repeat BMET in 1 week.  2) Labs: Scr 0.96, K 5.2 3) Follow-up: BMET in 1 week, 3 weeks with Pharmacy Clinic.    Karle Plumber, PharmD, BCPS, BCCP, CPP Heart Failure Clinic Pharmacist [phone]

## 2019-10-22 ENCOUNTER — Other Ambulatory Visit: Payer: Self-pay | Admitting: Cardiothoracic Surgery

## 2019-10-22 DIAGNOSIS — Z951 Presence of aortocoronary bypass graft: Secondary | ICD-10-CM

## 2019-10-23 ENCOUNTER — Other Ambulatory Visit: Payer: Self-pay | Admitting: Physician Assistant

## 2019-10-26 ENCOUNTER — Encounter: Payer: Self-pay | Admitting: Cardiothoracic Surgery

## 2019-10-26 ENCOUNTER — Ambulatory Visit
Admission: RE | Admit: 2019-10-26 | Discharge: 2019-10-26 | Disposition: A | Discharge status: Home / Self Care | Payer: Medicare Other | Source: Ambulatory Visit | Attending: Cardiothoracic Surgery | Admitting: Cardiothoracic Surgery

## 2019-10-26 ENCOUNTER — Other Ambulatory Visit: Payer: Self-pay

## 2019-10-26 ENCOUNTER — Ambulatory Visit (INDEPENDENT_AMBULATORY_CARE_PROVIDER_SITE_OTHER): Payer: Self-pay | Admitting: Cardiothoracic Surgery

## 2019-10-26 VITALS — BP 128/75 | HR 88 | Temp 97.3°F | Resp 16 | Ht 65.0 in | Wt 154.0 lb

## 2019-10-26 DIAGNOSIS — Z951 Presence of aortocoronary bypass graft: Secondary | ICD-10-CM

## 2019-10-26 DIAGNOSIS — J90 Pleural effusion, not elsewhere classified: Secondary | ICD-10-CM

## 2019-10-26 MED ORDER — CLOPIDOGREL BISULFATE 75 MG PO TABS: 75.0000 mg | ORAL_TABLET | Freq: Every day | ORAL | 11 refills | Status: DC

## 2019-10-26 MED ORDER — ASPIRIN EC 81 MG PO TBEC: 81.0000 mg | DELAYED_RELEASE_TABLET | Freq: Every day | ORAL | 2 refills | Status: AC

## 2019-10-26 NOTE — Progress Notes (Signed)
      [address]       [place] [address]             [phone]     CARDIOTHORACIC SURGERY OFFICE NOTE  Referring Provider is Lorre Nick, MD Primary Cardiologist is No primary care provider on file. PCP is Patient, No Pcp Per   HPI:  74 yo man s/p urgent CABG managed perioperatively with impella. Doing well since d/c. Had left pleural effusion drained since last visit. No complaints now.    Current Outpatient Medications  Medication Sig Dispense Refill  . digoxin (LANOXIN) 0.125 MG tablet Take 1 tablet (0.125 mg total) by mouth daily. 30 tablet 1  . furosemide (LASIX) 40 MG tablet Take 1 tablet (40 mg total) by mouth daily as needed (for weight gain of 2 pounds or more in 24 hours). 15 tablet 1  . lactobacillus acidophilus (BACID) TABS tablet Take 1 tablet by mouth daily.     Marland Kitchen losartan (COZAAR) 25 MG tablet Take 0.5 tablets (12.5 mg total) by mouth at bedtime. 30 tablet 1  . Multiple Vitamin (MULTIVITAMIN) capsule Take 1 capsule by mouth daily.    Marland Kitchen OVER THE COUNTER MEDICATION Apply 1 application topically daily as needed (pain). CBD cream    . potassium chloride SA (KLOR-CON) 20 MEQ tablet Take 1 tablet (20 mEq total) by mouth as needed. With every dose of lasix 90 tablet 3  . spironolactone (ALDACTONE) 25 MG tablet Take 1 tablet (25 mg total) by mouth at bedtime. 30 tablet 1  . aspirin EC 81 MG tablet Take 1 tablet (81 mg total) by mouth daily. 150 tablet 2  . clopidogrel (PLAVIX) 75 MG tablet Take 1 tablet (75 mg total) by mouth daily. 30 tablet 11   No current facility-administered medications for this visit.      Physical Exam:   BP 128/75 (BP Location: Left Arm, Patient Position: Sitting, Cuff Size: Normal)   Pulse 88   Temp (!) 97.3 F (36.3 C)   Resp 16   Ht 5\' 5"  (1.651 m)   Wt 69.9 kg   SpO2 98% Comment: RA  BMI 25.63 kg/m   General:  Well-appearing, NAD  Chest:   cta  CV:   rrr  Incisions:  Well-healed; sternum  stable  Abdomen:  sntnd  Extremities:  No edema  Diagnostic Tests:  CXR with very small bilateral pleural effusions   Impression:  Doing well after CABG  Plan:  F/u as needed with thoracic surgery Aspirin 81 mg po daily Plavix 75 mg po daily D/c amiodarone Referral for cardiac rehab   I spent in excess of 15 minutes during the conduct of this office consultation and >50% of this time involved direct face-to-face encounter with the patient for counseling and/or coordination of their care.  Level 2                 10 minutes Level 3                 15 minutes Level 4                 25 minutes Level 5                 40 minutes  B. , MD 10/26/2019 1:30 PM

## 2019-10-27 ENCOUNTER — Other Ambulatory Visit: Payer: Self-pay

## 2019-10-27 ENCOUNTER — Encounter (HOSPITAL_COMMUNITY): Payer: Self-pay

## 2019-10-27 ENCOUNTER — Telehealth (HOSPITAL_COMMUNITY): Payer: Self-pay

## 2019-10-27 NOTE — Telephone Encounter (Signed)
Attempted to call patient in regards to Cardiac Rehab - LM on VM Mailed letter 

## 2019-10-29 ENCOUNTER — Ambulatory Visit (HOSPITAL_COMMUNITY)
Admission: RE | Admit: 2019-10-29 | Discharge: 2019-10-29 | Disposition: A | Discharge status: Home / Self Care | Payer: Medicare Other | Source: Ambulatory Visit | Attending: Internal Medicine | Admitting: Internal Medicine

## 2019-10-29 ENCOUNTER — Other Ambulatory Visit: Payer: Self-pay

## 2019-10-29 VITALS — BP 122/76 | HR 79 | Wt 151.6 lb

## 2019-10-29 DIAGNOSIS — I4891 Unspecified atrial fibrillation: Secondary | ICD-10-CM | POA: Diagnosis not present

## 2019-10-29 DIAGNOSIS — I255 Ischemic cardiomyopathy: Secondary | ICD-10-CM | POA: Diagnosis not present

## 2019-10-29 DIAGNOSIS — I5022 Chronic systolic (congestive) heart failure: Secondary | ICD-10-CM

## 2019-10-29 DIAGNOSIS — I2510 Atherosclerotic heart disease of native coronary artery without angina pectoris: Secondary | ICD-10-CM | POA: Diagnosis not present

## 2019-10-29 DIAGNOSIS — Z7901 Long term (current) use of anticoagulants: Secondary | ICD-10-CM | POA: Diagnosis not present

## 2019-10-29 DIAGNOSIS — Z79899 Other long term (current) drug therapy: Secondary | ICD-10-CM | POA: Insufficient documentation

## 2019-10-29 LAB — BASIC METABOLIC PANEL
Anion gap: 8 (ref 5–15)
BUN: 24 mg/dL — ABNORMAL HIGH (ref 8–23)
CO2: 24 mmol/L (ref 22–32)
Calcium: 9.2 mg/dL (ref 8.9–10.3)
Chloride: 106 mmol/L (ref 98–111)
Creatinine, Ser: 0.96 mg/dL (ref 0.61–1.24)
GFR calc Af Amer: >60 mL/min (ref >60)
GFR calc non Af Amer: >60 mL/min (ref >60)
Glucose, Bld: 95 mg/dL (ref 70–99)
Potassium: 5.2 mmol/L — ABNORMAL HIGH (ref 3.5–5.1)
Sodium: 138 mmol/L (ref 135–145)

## 2019-10-29 MED ORDER — SPIRONOLACTONE 25 MG PO TABS: 12.5000 mg | ORAL_TABLET | Freq: Every day | ORAL | 11 refills | Status: DC

## 2019-10-29 MED ORDER — CARVEDILOL 3.125 MG PO TABS: 3.1250 mg | ORAL_TABLET | Freq: Two times a day (BID) | ORAL | 11 refills | Status: DC

## 2019-10-29 NOTE — Patient Instructions (Signed)
It was a pleasure seeing you today!  MEDICATIONS: -We are changing your medications today -Start carvedilol 3.125 mg (1 tablet) twice daily -Call if you have questions about your medications.  LABS: -We will call you if your labs need attention.  NEXT APPOINTMENT: Return to clinic in 4 weeks with Pharmacy Clinic.  In general, to take care of your heart failure: -Limit your fluid intake to 2 Liters (half-gallon) per day.   -Limit your salt intake to ideally 2-3 grams (2000-3000 mg) per day. -Weigh yourself daily and record, and bring that "weight diary" to your next appointment.  (Weight gain of 2-3 pounds in 1 day typically means fluid weight.) -The medications for your heart are to help your heart and help you live longer.   -Please contact us before stopping any of your heart medications.  Call the clinic at [phone] with questions or to reschedule future appointments.

## 2019-10-30 ENCOUNTER — Other Ambulatory Visit: Payer: Self-pay | Admitting: Physician Assistant

## 2019-11-04 ENCOUNTER — Ambulatory Visit (HOSPITAL_COMMUNITY)
Admission: RE | Admit: 2019-11-04 | Discharge: 2019-11-04 | Disposition: A | Discharge status: Home / Self Care | Payer: Medicare Other | Source: Ambulatory Visit | Attending: Cardiology | Admitting: Cardiology

## 2019-11-04 ENCOUNTER — Other Ambulatory Visit: Payer: Self-pay

## 2019-11-04 ENCOUNTER — Other Ambulatory Visit (HOSPITAL_COMMUNITY): Payer: Self-pay | Admitting: *Deleted

## 2019-11-04 DIAGNOSIS — I5022 Chronic systolic (congestive) heart failure: Secondary | ICD-10-CM | POA: Diagnosis not present

## 2019-11-04 LAB — BASIC METABOLIC PANEL
Anion gap: 8 (ref 5–15)
BUN: 27 mg/dL — ABNORMAL HIGH (ref 8–23)
CO2: 26 mmol/L (ref 22–32)
Calcium: 9.3 mg/dL (ref 8.9–10.3)
Chloride: 103 mmol/L (ref 98–111)
Creatinine, Ser: 1.12 mg/dL (ref 0.61–1.24)
GFR calc Af Amer: >60 mL/min (ref >60)
GFR calc non Af Amer: >60 mL/min (ref >60)
Glucose, Bld: 101 mg/dL — ABNORMAL HIGH (ref 70–99)
Potassium: 4.5 mmol/L (ref 3.5–5.1)
Sodium: 137 mmol/L (ref 135–145)

## 2019-11-18 ENCOUNTER — Telehealth (HOSPITAL_COMMUNITY): Payer: Self-pay

## 2019-11-19 ENCOUNTER — Other Ambulatory Visit (HOSPITAL_COMMUNITY): Payer: Self-pay | Admitting: Cardiology

## 2019-11-20 ENCOUNTER — Encounter (HOSPITAL_COMMUNITY)
Admission: RE | Admit: 2019-11-20 | Discharge: 2019-11-20 | Disposition: A | Discharge status: Home / Self Care | Payer: Medicare Other | Source: Ambulatory Visit | Attending: Internal Medicine | Admitting: Internal Medicine

## 2019-11-20 ENCOUNTER — Other Ambulatory Visit: Payer: Self-pay

## 2019-11-20 DIAGNOSIS — Z951 Presence of aortocoronary bypass graft: Secondary | ICD-10-CM | POA: Insufficient documentation

## 2019-11-20 NOTE — Progress Notes (Signed)
Cardiac Rehab Note:  Successful telephone encounter to Dennis Roberts to confirm Cardiac Rehab orientation appointment for 11/24/19 at 1:30. Nursing assessment completed. Instructions for appointment provided. Patient questions answered. Patient screening for Covid-19 negative.  Ashayla Subia E. Suzie Portela RN, BSN [place]. [facility]  Cardiac and Pulmonary Rehabilitation Phone: [phone] Fax: [phone]

## 2019-11-23 ENCOUNTER — Ambulatory Visit (HOSPITAL_COMMUNITY)
Admission: RE | Admit: 2019-11-23 | Discharge: 2019-11-23 | Disposition: A | Discharge status: Home / Self Care | Payer: Medicare Other | Source: Ambulatory Visit | Attending: Internal Medicine | Admitting: Internal Medicine

## 2019-11-23 ENCOUNTER — Encounter (HOSPITAL_COMMUNITY): Payer: Self-pay

## 2019-11-23 ENCOUNTER — Other Ambulatory Visit: Payer: Self-pay

## 2019-11-23 VITALS — BP 122/82 | HR 72 | Wt 156.4 lb

## 2019-11-23 DIAGNOSIS — I5022 Chronic systolic (congestive) heart failure: Secondary | ICD-10-CM | POA: Insufficient documentation

## 2019-11-23 DIAGNOSIS — I420 Dilated cardiomyopathy: Secondary | ICD-10-CM

## 2019-11-23 DIAGNOSIS — I255 Ischemic cardiomyopathy: Secondary | ICD-10-CM | POA: Diagnosis not present

## 2019-11-23 DIAGNOSIS — I4891 Unspecified atrial fibrillation: Secondary | ICD-10-CM | POA: Insufficient documentation

## 2019-11-23 DIAGNOSIS — I2510 Atherosclerotic heart disease of native coronary artery without angina pectoris: Secondary | ICD-10-CM | POA: Insufficient documentation

## 2019-11-23 DIAGNOSIS — Z79899 Other long term (current) drug therapy: Secondary | ICD-10-CM | POA: Diagnosis not present

## 2019-11-23 DIAGNOSIS — Z7901 Long term (current) use of anticoagulants: Secondary | ICD-10-CM | POA: Diagnosis not present

## 2019-11-23 MED ORDER — LOSARTAN POTASSIUM 25 MG PO TABS: 25.0000 mg | ORAL_TABLET | Freq: Every day | ORAL | 3 refills | Status: DC

## 2019-11-23 MED ORDER — DIGOXIN 125 MCG PO TABS: 0.1250 mg | ORAL_TABLET | Freq: Every day | ORAL | 3 refills | Status: DC

## 2019-11-23 NOTE — Patient Instructions (Signed)
It was a pleasure seeing you today!  MEDICATIONS: -We are changing your medications today -Increase the losartan to 1 tablet (25 mg) once daily -Call if you have questions about your medications.  NEXT APPOINTMENT: Return to clinic in 2 weeks with lab and 4 weeks with HF NP/PA.  In general, to take care of your heart failure: -Limit your fluid intake to 2 Liters (half-gallon) per day.   -Limit your salt intake to ideally 2-3 grams (2000-3000 mg) per day. -Weigh yourself daily and record, and bring that "weight diary" to your next appointment.  (Weight gain of 2-3 pounds in 1 day typically means fluid weight.) -The medications for your heart are to help your heart and help you live longer.   -Please contact us before stopping any of your heart medications.  Call the clinic at [phone] with questions or to reschedule future appointments.

## 2019-11-23 NOTE — Progress Notes (Signed)
ZOX:WRUEAVW, No Pcp Per PCP-Cardiologist:Dr. Marlou Porch AHF: Dr. Haroldine Laws  CT Surgery: Dr. Orvan Seen  HPI:  74 y/o male with no significantpast medical historyrecently admitted to [facility] 0[facility]9811 w/ acute systolic HF>>cardiogenic shock in the setting of severe multivessel CAD.   He had presented to the ER with a several day history of progressive shortness of breath. He was seen in the ER by Dr. Marlou Porch and felt to have cardiogenic shock in the setting of probable multivessel coronary artery disease. He developed progressive respiratory distress and was brought emergently to the catheterization lab.Started milrinone 0.25, given IV Lasix andstarted BiPAP.Did not require emergent intubation. EF <20% by echo. Urgentcardiac catheterization showed severe three-vessel disease with 99% left main. Right heart cath showed low output with marked volume overload.Impella was placed in cath lab for mechanical support.Underwent CABG 3/13 (LIMA to LAD; SVG to Diag; SVG sequential to OM1 and OM2) by Dr. Julien Girt. He was continued on milrinone post operatively and able to wean off w/ stable co-ox. Was placed on GDMT for systolic HF. Able to tolerate low dose losartan, spiro and digoxin, however upward titration was limited by borderline hypotension. He had some post operative atrial fibrillation treated w/ amiodarone however no other significant post operative complications. He progressed well and was discharged home on on 10/01/19. He was evaluated by PT prior to d/c and no home health PT/OT needed.   Recently presented to HF Clinic with Lyda Jester, PA-C on 10/08/19.Came with his sister. Doing well. Denied CP and no dyspnea. Able to ambulate w/o difficulty. NYHA Class II. Compliant w/ meds. Tolerating ok. BP 118/68 in clinic. No orthostatic symptoms. Maintaining NSR w/ amiodarone. Denied palpitations. Weight had been stable at home, 149-150 lb. Had only had to use PRN furosemide once since discharge. He had  f/u w/ Dr. Orvan Seen the following week.  At last visit with PA-C, spironolactone was increased to 25 mg daily. He also saw Dr. Orvan Seen in clinic on 10/26/19 where amiodarone was discontinued.   Today he returns to HF clinic for pharmacist medication titration. At last visit with PharmD on 10/29/19, he was started on carvedilol 3.125 mg BID. His potassium was elevated at 5.2 and he was instructed to decrease spironolactone to 12.5 mg daily and encouraged to follow a low K diet. Repeat labs on 11/04/19 resulted with lower potassium at 4.5.  Overall he is feeling very well. He has stopped drinking orange juice to help lower his potassium. He denies intake of other high potassium containing foods such as bananas or milk products. He denies dizziness, lightheadedness, fatigue, chest pain, and palpitations. His breathing is good and he denies shortness of breath. He is able to complete all ADLs and walks daily as well as riding a stationary bike for a mile per day. His reported weight at home ranges from 149-151 lbs. He has furosemide 40 mg PRN available to take for edema but he has never needed to take this. No LE edema, PND, or orthopnea. His appetite is very good and he follows a low sodium diet. He is taking all medications as prescribed and is tolerating all medications.   . Shortness of breath/dyspnea on exertion? no  . Orthopnea/PND? no . Edema? no . Lightheadedness/dizziness? no . Daily weights at home? yes . Blood pressure/heart rate monitoring at home? Yes - 122/71 this AM . Following low-sodium/fluid-restricted diet? yes  HF Medications: Furosemide 40 mg PRN Losartan 12.5 mg daily Carvedilol 3.125 mg BID Spironolactone 12.5 mg daily Digoxin 0.125 mg daily  Has  the patient been experiencing any side effects to the medications prescribed?  No  Does the patient have any problems obtaining medications due to transportation or finances?   No - has [employer]ARP Medicare Part D  Understanding of  regimen: good Understanding of indications: good Potential of compliance: good Patient understands to avoid NSAIDs. Patient understands to avoid decongestants.    Pertinent Lab Values (11/04/19): Marland Kitchen Serum creatinine 1.12, BUN 27, Potassium 4.5, Sodium 137, Digoxin 0.9 ng/mL  Vital Signs: . Weight: 156.4 lbs (last clinic weight: 151.6 lbs) . Blood pressure: 122/82 mmHg . 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 Recent Admit 09/2019 for acute CHF/Cardiogenic Shock 2/2 severe multivessel CAD - 09/18/19: Echo EF 20% RV ok - 09/18/19: Cath 3v CAD with 99% LM. Required Impella support,inotropic therapy + CABG - 09/20/19: Limited Echo post CABG EF 20-25% -NYHAII. Euvolemic on exam.  - Labs (11/04/19): SCr 1.12, K 4.5 -Vitals: BP 122/82; HR 72 - Continue furosemide 40 mg PRN - Continue carvedilol 3.125 mg BID  - Increase losartan to 25 mg daily. Check BMET in 2 weeks. -Continue spironolactone 12.5 mg daily - Continue digoxin 0.125 mg daily.Last digoxin level 0.9 ng/mL on 10/08/19 (not a true trough) - Plan repeat echo in 3 months. Refer to EP for ICD if EF remains < 35%  2.CAD - 09/18/19 cath with3VD w/ LM involvement w/99%  - Underwent CABG 3/13 (LIMA to LAD; SVG to Diag; SVG sequential to OM1 and OM2) - No s/s ischemia  - On ASA, clopidogrel, statin, carvedilol  3. Post-op Afib - Amiodarone discontinued on 10/26/19 by Dr. Vickey Sages.     Plan: 1) Medication changes: Based on clinical presentation, vital signs and recent labs will increase losartan to 25 mg daily 2) Labs: Check BMET in 2 weeks 3) Follow-up: Lab in 2 weeks; NP/PA in 1 month  Danae Orleans, PharmD, BCPS Heart Failure Clinic Pharmacist [phone]  Karle Plumber, PharmD, BCPS, BCCP, CPP Heart Failure Clinic Pharmacist [phone]

## 2019-11-23 NOTE — Telephone Encounter (Signed)
Cardiac Rehab Medication Review by a Pharmacist  Does the patient  feel that his/her medications are working for him/her?  yes  Has the patient been experiencing any side effects to the medications prescribed?  no  Does the patient measure his/her own blood pressure or blood glucose at home?  yes Pt measure blood pressure. Today was 122/82.  Does the patient have any problems obtaining medications due to transportation or finances?   no  Understanding of regimen: good Understanding of indications: good Potential of compliance: good    Pharmacist comments: None.   Lulu Riding, PharmD PGY1 Pharmacy Resident  Please check AMION for all [facility] Pharmacy phone numbers After 10:00 PM, call Main Pharmacy [phone]  11/23/2019 3:31 PM

## 2019-11-24 ENCOUNTER — Other Ambulatory Visit: Payer: Self-pay

## 2019-11-24 ENCOUNTER — Encounter (HOSPITAL_COMMUNITY): Payer: Self-pay

## 2019-11-24 ENCOUNTER — Encounter (HOSPITAL_COMMUNITY)
Admission: RE | Admit: 2019-11-24 | Discharge: 2019-11-24 | Disposition: A | Discharge status: Home / Self Care | Payer: Medicare Other | Source: Ambulatory Visit | Attending: Internal Medicine | Admitting: Internal Medicine

## 2019-11-24 VITALS — BP 118/72 | HR 81 | Ht 64.25 in | Wt 155.4 lb

## 2019-11-24 DIAGNOSIS — Z951 Presence of aortocoronary bypass graft: Secondary | ICD-10-CM

## 2019-11-24 HISTORY — DX: Heart failure, unspecified: I50.9

## 2019-11-24 HISTORY — DX: Atherosclerotic heart disease of native coronary artery without angina pectoris: I25.10

## 2019-11-24 NOTE — Progress Notes (Signed)
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  aspirin EC 81 MG tablet, Take 1 tablet (81 mg total) by mouth daily., Disp: 150 tablet, Rfl: 2 .  carvedilol (COREG) 3.125 MG tablet, Take 1 tablet (3.125 mg total) by mouth 2 (two) times daily., Disp: 60 tablet, Rfl: 11 .  clopidogrel (PLAVIX) 75 MG tablet, Take 1 tablet (75 mg total) by mouth daily., Disp: 30 tablet, Rfl: 11 .  digoxin (LANOXIN) 0.125 MG tablet, Take 1 tablet (0.125 mg total) by mouth daily., Disp: 90 tablet, Rfl: 3 .  furosemide (LASIX) 40 MG tablet, Take 1 tablet (40 mg total) by mouth daily as needed (for weight gain of 2 pounds or more in 24 hours)., Disp: 15 tablet, Rfl: 1 .  lactobacillus acidophilus (BACID) TABS tablet, Take 1 tablet by mouth daily. , Disp: , Rfl:  .  losartan (COZAAR) 25 MG tablet, Take 1 tablet (25 mg total) by mouth at bedtime., Disp: 90 tablet, Rfl: 3 .  Multiple Vitamin (MULTIVITAMIN) capsule, Take 1 capsule by mouth daily., Disp: , Rfl:  .  OVER THE COUNTER MEDICATION, Apply 1 application topically daily as needed (pain). CBD cream, Disp: , Rfl:  .  potassium chloride SA (KLOR-CON) 20 MEQ tablet, Take 1 tablet (20 mEq total) by mouth as needed. With every dose of lasix, Disp: 90 tablet, Rfl: 3 .  spironolactone (ALDACTONE) 25 MG tablet, Take 0.5 tablets (12.5 mg total) by mouth daily., Disp: 15 tablet, Rfl: 1  Past Medical History: Past Medical History:  Diagnosis Date  . CHF  (congestive heart failure) (HCC)   . Coronary artery disease     Tobacco Use: Social History   Tobacco Use  Smoking Status Former Smoker  . Quit date: 28  . Years since quitting: 47.4  Smokeless Tobacco Former Emergency planning/management officer: Recent Airline pilot for [employer] Cardiac and Pulmonary Rehab Latest Ref Rng & Units 09/29/2019 09/30/2019 10/01/2019 10/01/2019 10/01/2019   Cholestrol 0 - 200 mg/dL - - - - -   LDLCALC 0 - 99 mg/dL - - - - -   HDL >57 mg/dL - - - - -   Trlycerides <150 mg/dL - - - - -   Hemoglobin A1c 4.8 - 5.6 % - - - - -   PHART 7.350 - 7.450 - - - - -   PCO2ART 32.0 - 48.0 mmHg - - - - -   HCO3 20.0 - 28.0 mmol/L - - - - -   TCO2 22 - 32 mmol/L - - - - -   ACIDBASEDEF 0.0 - 2.0 mmol/L - - - - -   O2SAT % 74.5 78.7 43.3 54.5 87.1      Capillary Blood Glucose: Lab Results  Component Value Date   GLUCAP 101 (H) 09/28/2019   GLUCAP 121 (H) 09/28/2019   GLUCAP 98 09/28/2019   GLUCAP 103 (H) 09/28/2019   GLUCAP 106 (H)  09/27/2019     Exercise Target Goals: Exercise Program Goal: Individual exercise prescription set using results from initial 6 min walk test and THRR while considering  patient's activity barriers and safety.   Exercise Prescription Goal: Starting with aerobic activity 30 plus minutes a day, 3 days per week for initial exercise prescription. Provide home exercise prescription and guidelines that participant acknowledges understanding prior to discharge.  Activity Barriers & Risk Stratification: Activity Barriers & Cardiac Risk Stratification - 11/24/19 1335      Activity Barriers & Cardiac Risk Stratification   Activity Barriers  None    Cardiac Risk Stratification  High       6 Minute Walk: 6 Minute Walk    Row Name 11/24/19 1355         6 Minute Walk   Phase  Initial     Distance  1494 feet     Walk Time  6 minutes     # of Rest Breaks  0     MPH  2.83     METS  3.07     RPE  11     Perceived Dyspnea   0     VO2 Peak   10.76     Symptoms  No     Resting HR  81 bpm     Resting BP  118/72     Resting Oxygen Saturation   100 %     Exercise Oxygen Saturation  during 6 min walk  100 %     Max Ex. HR  102 bpm     Max Ex. BP  128/70     2 Minute Post BP  124/70        Oxygen Initial Assessment:   Oxygen Re-Evaluation:   Oxygen Discharge (Final Oxygen Re-Evaluation):   Initial Exercise Prescription: Initial Exercise Prescription - 11/24/19 1500      Date of Initial Exercise RX and Referring Provider   Date  11/24/19    Referring Provider  Dolores Patty, MD    Expected Discharge Date  01/22/20      Recumbant Bike   Level  2    Watts  20    Minutes  15    METs  2.9      Arm Ergometer   Level  2    RPM  35    Minutes  15      Prescription Details   Frequency (times per week)  3    Duration  Progress to 30 minutes of continuous aerobic without signs/symptoms of physical distress      Intensity   THRR 40-80% of Max Heartrate  59-118    Ratings of Perceived Exertion  11-13    Perceived Dyspnea  0-4      Progression   Progression  Continue to progress workloads to maintain intensity without signs/symptoms of physical distress.      Resistance Training   Training Prescription  Yes    Weight  3lbs    Reps  10-15       Perform Capillary Blood Glucose checks as needed.  Exercise Prescription Changes:   Exercise Comments:   Exercise Goals and Review:  Exercise Goals    Row Name 11/24/19 1336             Exercise Goals   Increase Physical Activity  Yes       Intervention  Provide advice, education, support and counseling about physical activity/exercise needs.;Develop an individualized exercise prescription  for aerobic and resistive training based on initial evaluation findings, risk stratification, comorbidities and participant's personal goals.       Expected Outcomes  Short Term: Attend rehab on a regular basis to increase amount of physical activity.;Long Term:  Exercising regularly at least 3-5 days a week.;Long Term: Add in home exercise to make exercise part of routine and to increase amount of physical activity.       Increase Strength and Stamina  Yes       Intervention  Provide advice, education, support and counseling about physical activity/exercise needs.;Develop an individualized exercise prescription for aerobic and resistive training based on initial evaluation findings, risk stratification, comorbidities and participant's personal goals.       Expected Outcomes  Short Term: Increase workloads from initial exercise prescription for resistance, speed, and METs.;Short Term: Perform resistance training exercises routinely during rehab and add in resistance training at home;Long Term: Improve cardiorespiratory fitness, muscular endurance and strength as measured by increased METs and functional capacity ( )       Able to understand and use rate of perceived exertion (RPE) scale  Yes       Intervention  Provide education and explanation on how to use RPE scale       Expected Outcomes  Short Term: Able to use RPE daily in rehab to express subjective intensity level;Long Term:  Able to use RPE to guide intensity level when exercising independently       Knowledge and understanding of Target Heart Rate Range (THRR)  Yes       Intervention  Provide education and explanation of THRR including how the numbers were predicted and where they are located for reference       Expected Outcomes  Short Term: Able to state/look up THRR;Long Term: Able to use THRR to govern intensity when exercising independently;Short Term: Able to use daily as guideline for intensity in rehab       Able to check pulse independently  Yes       Intervention  Provide education and demonstration on how to check pulse in carotid and radial arteries.;Review the importance of being able to check your own pulse for safety during independent exercise       Expected Outcomes  Short Term: Able  to explain why pulse checking is important during independent exercise;Long Term: Able to check pulse independently and accurately       Understanding of Exercise Prescription  Yes       Intervention  Provide education, explanation, and written materials on patient's individual exercise prescription       Expected Outcomes  Short Term: Able to explain program exercise prescription;Long Term: Able to explain home exercise prescription to exercise independently          Exercise Goals Re-Evaluation :    Discharge Exercise Prescription (Final Exercise Prescription Changes):   Nutrition:  Target Goals: Understanding of nutrition guidelines, daily intake of sodium 1500mg , cholesterol 200mg , calories 30% from fat and 7% or less from saturated fats, daily to have 5 or more servings of fruits and vegetables.  Biometrics: Pre Biometrics - 11/24/19 1335      Pre Biometrics   Height  5' 4.25" (1.632 m)    Weight  70.5 kg    Waist Circumference  36.5 inches    Hip Circumference  38.75 inches    Waist to Hip Ratio  0.94 %    BMI (Calculated)  26.47    Triceps Skinfold  13 mm    %  Body Fat  25.4 %    Grip Strength  29 kg    Flexibility  11 in    Single Leg Stand  4.25 seconds        Nutrition Therapy Plan and Nutrition Goals:   Nutrition Assessments:   Nutrition Goals Re-Evaluation:   Nutrition Goals Discharge (Final Nutrition Goals Re-Evaluation):   Psychosocial: Target Goals: Acknowledge presence or absence of significant depression and/or stress, maximize coping skills, provide positive support system. Participant is able to verbalize types and ability to use techniques and skills needed for reducing stress and depression.  Initial Review & Psychosocial Screening: Initial Psych Review & Screening - 11/24/19 1624      Initial Review   Current issues with  None Identified      Family Dynamics   Good Support System?  Yes   Haron lives alone but has his sister and son for  support who live nearby     Barriers   Psychosocial barriers to participate in program  There are no identifiable barriers or psychosocial needs.      Screening Interventions   Interventions  Encouraged to exercise       Quality of Life Scores: Quality of Life - 11/24/19 1625      Quality of Life   Select  Quality of Life      Quality of Life Scores   Health/Function Pre  25.47 %    Socioeconomic Pre  23.07 %    Psych/Spiritual Pre  22.43 %    Family Pre  25.5 %    GLOBAL Pre  24.32 %      Scores of 19 and below usually indicate a poorer quality of life in these areas.  A difference of  2-3 points is a clinically meaningful difference.  A difference of 2-3 points in the total score of the Quality of Life Index has been associated with significant improvement in overall quality of life, self-image, physical symptoms, and general health in studies assessing change in quality of life.  PHQ-9: Recent Review Flowsheet Data    Depression screen [facility] 2/9 11/24/2019   Decreased Interest 0   Down, Depressed, Hopeless 0   PHQ - 2 Score 0     Interpretation of Total Score  Total Score Depression Severity:  1-4 = Minimal depression, 5-9 = Mild depression, 10-14 = Moderate depression, 15-19 = Moderately severe depression, 20-27 = Severe depression   Psychosocial Evaluation and Intervention:   Psychosocial Re-Evaluation:   Psychosocial Discharge (Final Psychosocial Re-Evaluation):   Vocational Rehabilitation: Provide vocational rehab assistance to qualifying candidates.   Vocational Rehab Evaluation & Intervention: Vocational Rehab - 11/24/19 1632      Initial Vocational Rehab Evaluation & Intervention   Assessment shows need for Vocational Rehabilitation  --   Hjalmar is retired and does not need vocational rehab at this time      Education: Education Goals: Education classes will be provided on a weekly basis, covering required topics. Participant will state  understanding/return demonstration of topics presented.  Learning Barriers/Preferences: Learning Barriers/Preferences - 11/24/19 1627      Learning Barriers/Preferences   Learning Barriers  Sight;Hearing   wears glasses, mildly hard of hearing   Learning Preferences  Pictoral;Video       Education Topics: Hypertension, Hypertension Reduction -Define heart disease and high blood pressure. Discus how high blood pressure affects the body and ways to reduce high blood pressure.   Exercise and Your Heart -Discuss why it is important to exercise,  the FITT principles of exercise, normal and abnormal responses to exercise, and how to exercise safely.   Angina -Discuss definition of angina, causes of angina, treatment of angina, and how to decrease risk of having angina.   Cardiac Medications -Review what the following cardiac medications are used for, how they affect the body, and side effects that may occur when taking the medications.  Medications include Aspirin, Beta blockers, calcium channel blockers, ACE Inhibitors, angiotensin receptor blockers, diuretics, digoxin, and antihyperlipidemics.   Congestive Heart Failure -Discuss the definition of CHF, how to live with CHF, the signs and symptoms of CHF, and how keep track of weight and sodium intake.   Heart Disease and Intimacy -Discus the effect sexual activity has on the heart, how changes occur during intimacy as we age, and safety during sexual activity.   Smoking Cessation / COPD -Discuss different methods to quit smoking, the health benefits of quitting smoking, and the definition of COPD.   Nutrition I: Fats -Discuss the types of cholesterol, what cholesterol does to the heart, and how cholesterol levels can be controlled.   Nutrition II: Labels -Discuss the different components of food labels and how to read food label   Heart Parts/Heart Disease and PAD -Discuss the anatomy of the heart, the pathway of blood  circulation through the heart, and these are affected by heart disease.   Stress I: Signs and Symptoms -Discuss the causes of stress, how stress may lead to anxiety and depression, and ways to limit stress.   Stress II: Relaxation -Discuss different types of relaxation techniques to limit stress.   Warning Signs of Stroke / TIA -Discuss definition of a stroke, what the signs and symptoms are of a stroke, and how to identify when someone is having stroke.   Knowledge Questionnaire Score: Knowledge Questionnaire Score - 11/24/19 1629      Knowledge Questionnaire Score   Pre Score  24/24       Core Components/Risk Factors/Patient Goals at Admission: Personal Goals and Risk Factors at Admission - 11/24/19 1629      Core Components/Risk Factors/Patient Goals on Admission    Weight Management  Yes;Weight Maintenance    Intervention  Weight Management: Develop a combined nutrition and exercise program designed to reach desired caloric intake, while maintaining appropriate intake of nutrient and fiber, sodium and fats, and appropriate energy expenditure required for the weight goal.;Weight Management: Provide education and appropriate resources to help participant work on and attain dietary goals.    Expected Outcomes  Short Term: Continue to assess and modify interventions until short term weight is achieved;Long Term: Adherence to nutrition and physical activity/exercise program aimed toward attainment of established weight goal;Weight Maintenance: Understanding of the daily nutrition guidelines, which includes 25-35% calories from fat, 7% or less cal from saturated fats, less than  cholesterol, less than 1.5gm of sodium, & 5 or more servings of fruits and vegetables daily;Understanding of distribution of calorie intake throughout the day with the consumption of 4-5 meals/snacks;Understanding recommendations for meals to include 15-35% energy as protein, 25-35% energy from fat, 35-60%  energy from carbohydrates, less than  of dietary cholesterol, 20-35 gm of total fiber daily    Heart Failure  Yes    Intervention  Provide a combined exercise and nutrition program that is supplemented with education, support and counseling about heart failure. Directed toward relieving symptoms such as shortness of breath, decreased exercise tolerance, and extremity edema.    Expected Outcomes  Short term: Attendance in program 2-3  days a week with increased exercise capacity. Reported lower sodium intake. Reported increased fruit and vegetable intake. Reports medication compliance.;Short term: Daily weights obtained and reported for increase. Utilizing diuretic protocols set by physician.;Long term: Adoption of self-care skills and reduction of barriers for early signs and symptoms recognition and intervention leading to self-care maintenance.       Core Components/Risk Factors/Patient Goals Review:    Core Components/Risk Factors/Patient Goals at Discharge (Final Review):    ITP Comments: ITP Comments    Row Name 11/24/19 1621           ITP Comments  Dr Armanda Magicraci Turner MD, Medical Director          Comments: Channing Muttersoy attended orientation on 11/24/2019 to review rules and guidelines for program.  Completed 6 minute walk test, Intitial ITP, and exercise prescription.  VSS. Telemetry-Sinus Rhythm first degree heart block, this has been previously documented..  Asymptomatic. Safety measures and social distancing in place per CDC guidelines.[place]ad[place] LighterMaria Whitaker, RN,BSN 11/24/2019 4:38 PM

## 2019-11-27 ENCOUNTER — Telehealth (HOSPITAL_COMMUNITY): Payer: Self-pay | Admitting: Pharmacist

## 2019-11-27 NOTE — Telephone Encounter (Signed)
Received message from patient regarding issue with recent medication change. He stated that last night he had some lower extremity edema before he went to bed. He checks his weight daily and it has been stable around 150-151 lbs. No change in sodium or water intake. He had not taken any furosemide and the edema had resolved on its own this morning. He did not think he needed to take the furosemide this morning to resolve the edema but will watch it cautiously.   I counseled him to take his furosemide 40mg  daily PRN for noticeable lower extremity edema or weight gain >3lbs in 1 day or >5lbs in 1 week. He verbalized understanding and was agreeable with the plan.   , PharmD PGY1 Ambulatory Care Pharmacy Resident

## 2019-11-30 ENCOUNTER — Other Ambulatory Visit: Payer: Self-pay

## 2019-11-30 ENCOUNTER — Encounter (HOSPITAL_COMMUNITY)
Admission: RE | Admit: 2019-11-30 | Discharge: 2019-11-30 | Disposition: A | Discharge status: Home / Self Care | Payer: Medicare Other | Source: Ambulatory Visit | Attending: Internal Medicine | Admitting: Internal Medicine

## 2019-11-30 DIAGNOSIS — Z951 Presence of aortocoronary bypass graft: Secondary | ICD-10-CM

## 2019-11-30 NOTE — Progress Notes (Signed)
Daily Session Note  Patient Details  Name: Dennis Roberts MRN: [id number] Date of Birth: 1946/02/22 Referring Provider:     CARDIAC REHAB PHASE II ORIENTATION from 11/24/2019 in [place]  Referring Provider  Bensimhon, Shaune Pascal, MD      Encounter Date: 11/30/2019  Check In: Session Check In - 11/30/19 1357      Check-In   Supervising physician immediately available to respond to emergencies  [facility] immediately available    Physician(s)  Dr Tyrell Antonio    Location  [facility]    Staff Present  Seward Carol, MS, ACSM CEP, Exercise Physiologist;Maria Whitaker, RN, Luisa Hart, RN, BSN;David Makemson MS, EP-C, CCRP    Virtual Visit  No    Medication changes reported      No    Fall or balance concerns reported     No    Tobacco Cessation  No Change    Warm-up and Cool-down  Performed on first and last piece of equipment    Resistance Training Performed  Yes    VAD Patient?  No    PAD/SET Patient?  No      Pain Assessment   Currently in Pain?  No/denies    Pain Score  0-No pain       Capillary Blood Glucose: No results found for this or any previous visit (from the past 24 hour(s)).  Exercise Prescription Changes - 11/30/19 1345      Response to Exercise   Blood Pressure (Admit)  130/66    Blood Pressure (Exercise)  148/82    Blood Pressure (Exit)  112/66    Heart Rate (Admit)  75 bpm    Heart Rate (Exercise)  107 bpm    Heart Rate (Exit)  83 bpm    Rating of Perceived Exertion (Exercise)  13    Symptoms  none    Comments  Off to a good start with exercise.    Duration  Continue with 30 min of aerobic exercise without signs/symptoms of physical distress.    Intensity  THRR unchanged      Progression   Progression  Continue to progress workloads to maintain intensity without signs/symptoms of physical distress.    Average METs  3.3      Resistance Training   Training Prescription  Yes    Weight  3lbs     Reps  10-15    Time  10 Minutes      Interval Training   Interval Training  No      Recumbant Bike   Level  2    Watts  20    Minutes  15    METs  3.3      Arm Ergometer   Level  2    RPM  35    Minutes  15       Social History   Tobacco Use  Smoking Status Former Smoker  . Quit date: 46  . Years since quitting: 47.4  Smokeless Tobacco Former Systems developer    Goals Met:  Exercise tolerated well No report of cardiac concerns or symptoms Strength training completed today  Goals Unmet:  Not Applicable  Comments: Pt started cardiac rehab today.  Pt tolerated light exercise without difficulty. VSS, telemetry-NSR, asymptomatic.  Medication list reconciled. Pt denies barriers to medicaiton compliance.  PSYCHOSOCIAL ASSESSMENT:  PHQ-0. Pt exhibits positive coping skills, hopeful outlook with supportive family. No psychosocial needs identified at this time, no psychosocial  interventions necessary.  Pt oriented to exercise equipment and routine. Understanding verbalized.   Dr. Fransico Him is Medical Director for Cardiac Rehab at [facility].

## 2019-12-02 ENCOUNTER — Encounter (HOSPITAL_COMMUNITY)
Admission: RE | Admit: 2019-12-02 | Discharge: 2019-12-02 | Disposition: A | Discharge status: Home / Self Care | Payer: Medicare Other | Source: Ambulatory Visit | Attending: Internal Medicine | Admitting: Internal Medicine

## 2019-12-02 ENCOUNTER — Other Ambulatory Visit: Payer: Self-pay

## 2019-12-02 DIAGNOSIS — Z951 Presence of aortocoronary bypass graft: Secondary | ICD-10-CM

## 2019-12-04 ENCOUNTER — Other Ambulatory Visit: Payer: Self-pay

## 2019-12-04 ENCOUNTER — Encounter (HOSPITAL_COMMUNITY)
Admission: RE | Admit: 2019-12-04 | Discharge: 2019-12-04 | Disposition: A | Discharge status: Home / Self Care | Payer: Medicare Other | Source: Ambulatory Visit | Attending: Internal Medicine | Admitting: Internal Medicine

## 2019-12-04 DIAGNOSIS — Z951 Presence of aortocoronary bypass graft: Secondary | ICD-10-CM

## 2019-12-08 ENCOUNTER — Other Ambulatory Visit (HOSPITAL_COMMUNITY): Payer: Medicare Other

## 2019-12-09 ENCOUNTER — Encounter (HOSPITAL_COMMUNITY)
Admission: RE | Admit: 2019-12-09 | Discharge: 2019-12-09 | Disposition: A | Discharge status: Home / Self Care | Payer: Medicare Other | Source: Ambulatory Visit | Attending: Internal Medicine | Admitting: Internal Medicine

## 2019-12-09 ENCOUNTER — Other Ambulatory Visit: Payer: Self-pay

## 2019-12-09 DIAGNOSIS — Z951 Presence of aortocoronary bypass graft: Secondary | ICD-10-CM | POA: Diagnosis not present

## 2019-12-09 NOTE — Progress Notes (Signed)
Reviewed home exercise guidelines with patient including endpoints, temperature precautions, target heart rate and rate of perceived exertion. Pt is riding his stationary bike or walking 5-6 minutes 4 days/week as his mode of home exercise. Encouraged patient to increase duration, and he is amenable to this. Pt also has weights at home of varying sizes for his resistance training. Pt knows how to manually count his pulse. Pt also states he's active doing yard work without difficulty. Pt voices understanding of instructions given.  Artist Pais, MS, ACSM CEP

## 2019-12-09 NOTE — Progress Notes (Signed)
Dennis Roberts 74 y.o. 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 CHF (congestive heart failure) (HCC)   . Coronary artery disease      Medications reviewed.   Current Outpatient Medications:  .  aspirin EC 81 MG tablet, Take 1 tablet (81 mg total) by mouth daily., Disp: 150 tablet, Rfl: 2 .  carvedilol (COREG) 3.125 MG tablet, Take 1 tablet (3.125 mg total) by mouth 2 (two) times daily., Disp: 60 tablet, Rfl: 11 .  clopidogrel (PLAVIX) 75 MG tablet, Take 1 tablet (75 mg total) by mouth daily., Disp: 30 tablet, Rfl: 11 .  digoxin (LANOXIN) 0.125 MG tablet, Take 1 tablet (0.125 mg total) by mouth daily., Disp: 90 tablet, Rfl: 3 .  furosemide (LASIX) 40 MG tablet, Take 1 tablet (40 mg total) by mouth daily as needed (for weight gain of 2 pounds or more in 24 hours)., Disp: 15 tablet, Rfl: 1 .  lactobacillus acidophilus (BACID) TABS tablet, Take 1 tablet by mouth daily. , Disp: , Rfl:  .  losartan (COZAAR) 25 MG tablet, Take 1 tablet (25 mg total) by mouth at bedtime., Disp: 90 tablet, Rfl: 3 .  Multiple Vitamin (MULTIVITAMIN) capsule, Take 1 capsule by mouth daily., Disp: , Rfl:  .  OVER THE COUNTER MEDICATION, Apply 1 application topically daily as needed (pain). CBD cream, Disp: , Rfl:  .  potassium chloride SA (KLOR-CON) 20 MEQ tablet, Take 1 tablet (20 mEq total) by mouth as needed. With every dose of lasix, Disp: 90 tablet, Rfl: 3 .  spironolactone (ALDACTONE) 25 MG tablet, Take 0.5 tablets (12.5 mg total) by mouth daily., Disp: 15 tablet, Rfl: 1   Ht Readings from Last 1 Encounters:  11/24/19 5' 4.25" (1.632 m)     Wt Readings from Last 3 Encounters:  11/24/19 155 lb 6.8 oz (70.5 kg)  11/23/19 156 lb 6.4 oz (70.9 kg)  10/29/19 151 lb 9.6 oz (68.8 kg)     There is no height or weight on file to calculate BMI.   Social History   Tobacco Use  Smoking Status Former Smoker  . Quit date: 24  . Years  since quitting: 47.4  Smokeless Tobacco Former Water engineer Value Date   CHOL 148 09/19/2019   Lab Results  Component Value Date   HDL 43 09/19/2019   Lab Results  Component Value Date   LDLCALC 95 09/19/2019   Lab Results  Component Value Date   TRIG 50 09/19/2019     Lab Results  Component Value Date   HGBA1C 4.9 09/18/2019     CBG (last 3)  No results for input(s): GLUCAP in the last 72 hours.   Nutrition Note  Spoke with pt. Nutrition Plan and Nutrition Survey goals reviewed with pt. Pt has been trying to eat a heart healthy diet. He reports limiting sodium and reading labels.  1 16 oz soda/day Inadequate veggies and fruit and total fiber intake. Discussed ways to improve.  Per discussion, pt does use canned/convenience foods often. Pt does not add salt to food. Pt does not eat out frequently.   Pt expressed understanding of the information reviewed.   Nutrition Diagnosis ? Food-and nutrition-related knowledge deficit related to lack of exposure to information as related to diagnosis of: ? CVD ?   Nutrition Intervention ? Pt's individual nutrition plan reviewed with pt. ? Benefits  of adopting Heart Healthy diet discussed when [employer] reviewed.   ? Pt given handouts for: ? Nutrition I class ? Nutrition II class ? ? Continue client-centered nutrition education by RD, as part of interdisciplinary care.  Goal(s) ? Pt to build a healthy plate including vegetables, fruits, whole grains, and low-fat dairy products in a heart healthy meal plan.  Plan:   Will provide client-centered nutrition education as part of interdisciplinary care  Monitor and evaluate progress toward nutrition goal with team.   Michaele Offer, MS, RDN, LDN

## 2019-12-09 NOTE — Progress Notes (Signed)
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  aspirin EC 81 MG tablet, Take 1 tablet (81 mg total) by mouth daily., Disp: 150 tablet, Rfl: 2 .  carvedilol (COREG) 3.125 MG tablet, Take 1 tablet (3.125 mg total) by mouth 2 (two) times daily., Disp: 60 tablet, Rfl: 11 .  clopidogrel (PLAVIX) 75 MG tablet, Take 1 tablet (75 mg total) by mouth daily., Disp: 30 tablet, Rfl: 11 .  digoxin (LANOXIN) 0.125 MG tablet, Take 1 tablet (0.125 mg total) by mouth daily., Disp: 90 tablet, Rfl: 3 .  furosemide (LASIX) 40 MG tablet, Take 1 tablet (40 mg total) by mouth daily as needed (for weight gain of 2 pounds or more in 24 hours)., Disp: 15 tablet, Rfl: 1 .  lactobacillus acidophilus (BACID) TABS tablet, Take 1 tablet by mouth daily. , Disp: , Rfl:  .  losartan (COZAAR) 25 MG tablet, Take 1 tablet (25 mg total) by mouth at bedtime., Disp: 90 tablet, Rfl: 3 .  Multiple Vitamin (MULTIVITAMIN) capsule, Take 1 capsule by mouth daily., Disp: , Rfl:  .  OVER THE COUNTER MEDICATION, Apply 1 application topically daily as needed (pain). CBD cream, Disp: , Rfl:  .  potassium chloride SA (KLOR-CON) 20 MEQ tablet, Take 1 tablet (20 mEq total) by mouth as needed. With every dose of lasix, Disp: 90 tablet, Rfl: 3 .  spironolactone (ALDACTONE) 25 MG tablet, Take 0.5 tablets (12.5 mg total) by mouth daily., Disp: 15 tablet, Rfl: 1  Past Medical History: Past Medical History:  Diagnosis Date  . CHF  (congestive heart failure) (HCC)   . Coronary artery disease     Tobacco Use: Social History   Tobacco Use  Smoking Status Former Smoker  . Quit date: 28  . Years since quitting: 47.4  Smokeless Tobacco Former Emergency planning/management officer: Recent Airline pilot for [employer] Cardiac and Pulmonary Rehab Latest Ref Rng & Units 09/29/2019 09/30/2019 10/01/2019 10/01/2019 10/01/2019   Cholestrol 0 - 200 mg/dL - - - - -   LDLCALC 0 - 99 mg/dL - - - - -   HDL >57 mg/dL - - - - -   Trlycerides <150 mg/dL - - - - -   Hemoglobin A1c 4.8 - 5.6 % - - - - -   PHART 7.350 - 7.450 - - - - -   PCO2ART 32.0 - 48.0 mmHg - - - - -   HCO3 20.0 - 28.0 mmol/L - - - - -   TCO2 22 - 32 mmol/L - - - - -   ACIDBASEDEF 0.0 - 2.0 mmol/L - - - - -   O2SAT % 74.5 78.7 43.3 54.5 87.1      Capillary Blood Glucose: Lab Results  Component Value Date   GLUCAP 101 (H) 09/28/2019   GLUCAP 121 (H) 09/28/2019   GLUCAP 98 09/28/2019   GLUCAP 103 (H) 09/28/2019   GLUCAP 106 (H)  09/27/2019     Exercise Target Goals: Exercise Program Goal: Individual exercise prescription set using results from initial 6 min walk test and THRR while considering  patient's activity barriers and safety.   Exercise Prescription Goal: Initial exercise prescription builds to 30-45 minutes a day of aerobic activity, 2-3 days per week.  Home exercise guidelines will be given to patient during program as part of exercise prescription that the participant will acknowledge.  Activity Barriers & Risk Stratification: Activity Barriers & Cardiac Risk Stratification - 11/24/19 1335      Activity Barriers & Cardiac Risk Stratification   Activity Barriers  None    Cardiac Risk Stratification  High       6 Minute Walk: 6 Minute Walk    Row Name 11/24/19 1355         6 Minute Walk   Phase  Initial     Distance  1494 feet     Walk Time  6 minutes     # of Rest Breaks  0     MPH  2.83     METS  3.07     RPE  11     Perceived Dyspnea    0     VO2 Peak  10.76     Symptoms  No     Resting HR  81 bpm     Resting BP  118/72     Resting Oxygen Saturation   100 %     Exercise Oxygen Saturation  during 6 min walk  100 %     Max Ex. HR  102 bpm     Max Ex. BP  128/70     2 Minute Post BP  124/70        Oxygen Initial Assessment:   Oxygen Re-Evaluation:   Oxygen Discharge (Final Oxygen Re-Evaluation):   Initial Exercise Prescription: Initial Exercise Prescription - 11/24/19 1500      Date of Initial Exercise RX and Referring Provider   Date  11/24/19    Referring Provider  Jolaine Artist, MD    Expected Discharge Date  01/22/20      Recumbant Bike   Level  2    Watts  20    Minutes  15    METs  2.9      Arm Ergometer   Level  2    RPM  35    Minutes  15      Prescription Details   Frequency (times per week)  3    Duration  Progress to 30 minutes of continuous aerobic without signs/symptoms of physical distress      Intensity   THRR 40-80% of Max Heartrate  59-118    Ratings of Perceived Exertion  11-13    Perceived Dyspnea  0-4      Progression   Progression  Continue to progress workloads to maintain intensity without signs/symptoms of physical distress.      Resistance Training   Training Prescription  Yes    Weight  3lbs    Reps  10-15       Perform Capillary Blood Glucose checks as needed.  Exercise Prescription Changes:  Exercise Prescription Changes    Row Name 11/30/19 1345 12/04/19 1348           Response to Exercise   Blood Pressure (Admit)  130/66  114/72      Blood Pressure (Exercise)  148/82  128/62      Blood Pressure (Exit)  112/66  100/72      Heart Rate (Admit)  75 bpm  86 bpm      Heart Rate (Exercise)  107 bpm  98 bpm      Heart Rate (Exit)  83 bpm  83 bpm      Rating of Perceived Exertion (Exercise)  13  13      Symptoms  none  none      Comments  Off to a good start with exercise.  --      Duration  Continue with 30 min of aerobic exercise without  signs/symptoms of physical distress.  Continue with 30 min of aerobic exercise without signs/symptoms of physical distress.      Intensity  THRR unchanged  THRR unchanged        Progression   Progression  Continue to progress workloads to maintain intensity without signs/symptoms of physical distress.  Continue to progress workloads to maintain intensity without signs/symptoms of physical distress.      Average METs  3.3  3.5        Resistance Training   Training Prescription  Yes  Yes      Weight  3lbs  3lbs      Reps  10-15  10-15      Time  10 Minutes  10 Minutes        Interval Training   Interval Training  No  No        Recumbant Bike   Level  2  2      Watts  20  --      Minutes  15  15      METs  3.3  3.5        Arm Ergometer   Level  2  2      RPM  35  35      Minutes  15  15         Exercise Comments:  Exercise Comments    Row Name 11/30/19 1437 12/09/19 1331         Exercise Comments  Patient tolerated 1st session of exercise well without symptoms.  Reviewed home exercise guidelines, METs, and goals with patient.         Exercise Goals and Review:  Exercise Goals    Row Name 11/24/19 1336             Exercise Goals   Increase Physical Activity  Yes       Intervention  Provide advice, education, support and counseling about physical activity/exercise needs.;Develop an individualized exercise prescription for aerobic and resistive training based on initial evaluation findings, risk stratification, comorbidities and participant's personal goals.       Expected Outcomes  Short Term: Attend rehab on a regular basis to increase amount of physical activity.;Long Term: Exercising regularly at least 3-5 days a week.;Long Term: Add in home exercise to make exercise part of routine and to increase amount of physical activity.       Increase Strength and Stamina  Yes       Intervention  Provide advice, education, support and counseling about physical  activity/exercise needs.;Develop an individualized exercise prescription for aerobic and resistive training based on initial evaluation findings, risk stratification, comorbidities and participant's personal goals.       Expected Outcomes  Short Term: Increase workloads from initial exercise prescription for resistance, speed, and METs.;Short Term: Perform resistance training exercises routinely during rehab and add in resistance training at home;Long Term:  Improve cardiorespiratory fitness, muscular endurance and strength as measured by increased METs and functional capacity ( )       Able to understand and use rate of perceived exertion (RPE) scale  Yes       Intervention  Provide education and explanation on how to use RPE scale       Expected Outcomes  Short Term: Able to use RPE daily in rehab to express subjective intensity level;Long Term:  Able to use RPE to guide intensity level when exercising independently       Knowledge and understanding of Target Heart Rate Range (THRR)  Yes       Intervention  Provide education and explanation of THRR including how the numbers were predicted and where they are located for reference       Expected Outcomes  Short Term: Able to state/look up THRR;Long Term: Able to use THRR to govern intensity when exercising independently;Short Term: Able to use daily as guideline for intensity in rehab       Able to check pulse independently  Yes       Intervention  Provide education and demonstration on how to check pulse in carotid and radial arteries.;Review the importance of being able to check your own pulse for safety during independent exercise       Expected Outcomes  Short Term: Able to explain why pulse checking is important during independent exercise;Long Term: Able to check pulse independently and accurately       Understanding of Exercise Prescription  Yes       Intervention  Provide education, explanation, and written materials on patient's individual  exercise prescription       Expected Outcomes  Short Term: Able to explain program exercise prescription;Long Term: Able to explain home exercise prescription to exercise independently          Exercise Goals Re-Evaluation : Exercise Goals Re-Evaluation    Row Name 11/30/19 1437 12/09/19 1331           Exercise Goal Re-Evaluation   Exercise Goals Review  Increase Physical Activity;Able to understand and use rate of perceived exertion (RPE) scale  Increase Physical Activity;Able to understand and use rate of perceived exertion (RPE) scale;Able to check pulse independently;Knowledge and understanding of Target Heart Rate Range (THRR);Increase Strength and Stamina;Understanding of Exercise Prescription      Comments  Patient able to understand and use RPE scale appropriately.  Reviewed home exercise guidelines with patient including endpoints, temperature precautions, target heart rate and rate of perceived exertion. Pt is riding his stationary bike or walking 5-6 minutes 4 days/week as his mode of home exercise. Encouraged patient to increase duration, and he is amenable to this. Pt also has weights at home of varying sizes for his resistance training. Pt knows how to manually count his pulse. Pt also states he's active doing yard work without difficulty. Pt voices understanding of instructions given.      Expected Outcomes  Increase workloads as tolerated to help increase cardiorespiratory fitness.  Patient will increase exercise duration from 5-6 minutes to 30 minutes at least 2 days/week in addition to exercise at cardiac rehab.         Discharge Exercise Prescription (Final Exercise Prescription Changes): Exercise Prescription Changes - 12/04/19 1348      Response to Exercise   Blood Pressure (Admit)  114/72    Blood Pressure (Exercise)  128/62    Blood Pressure (Exit)  100/72    Heart Rate (Admit)  86  bpm    Heart Rate (Exercise)  98 bpm    Heart Rate (Exit)  83 bpm    Rating of  Perceived Exertion (Exercise)  13    Symptoms  none    Duration  Continue with 30 min of aerobic exercise without signs/symptoms of physical distress.    Intensity  THRR unchanged      Progression   Progression  Continue to progress workloads to maintain intensity without signs/symptoms of physical distress.    Average METs  3.5      Resistance Training   Training Prescription  Yes    Weight  3lbs    Reps  10-15    Time  10 Minutes      Interval Training   Interval Training  No      Recumbant Bike   Level  2    Minutes  15    METs  3.5      Arm Ergometer   Level  2    RPM  35    Minutes  15       Nutrition:  Target Goals: Understanding of nutrition guidelines, daily intake of sodium 1500mg , cholesterol 200mg , calories 30% from fat and 7% or less from saturated fats, daily to have 5 or more servings of fruits and vegetables.  Biometrics: Pre Biometrics - 11/24/19 1335      Pre Biometrics   Height  5' 4.25" (1.632 m)    Weight  70.5 kg    Waist Circumference  36.5 inches    Hip Circumference  38.75 inches    Waist to Hip Ratio  0.94 %    BMI (Calculated)  26.47    Triceps Skinfold  13 mm    % Body Fat  25.4 %    Grip Strength  29 kg    Flexibility  11 in    Single Leg Stand  4.25 seconds        Nutrition Therapy Plan and Nutrition Goals: Nutrition Therapy & Goals - 12/09/19 1430      Nutrition Therapy   Diet  Heart Healthy      Personal Nutrition Goals   Nutrition Goal  Pt to build a healthy plate including vegetables, fruits, whole grains, and low-fat dairy products in a heart healthy meal plan.      Intervention Plan   Intervention  Nutrition handout(s) given to patient.;Prescribe, educate and counsel regarding individualized specific dietary modifications aiming towards targeted core components such as weight, hypertension, lipid management, diabetes, heart failure and other comorbidities.    Expected Outcomes  Short Term Goal: Understand basic  principles of dietary content, such as calories, fat, sodium, cholesterol and nutrients.       Nutrition Assessments: Nutrition Assessments - 12/09/19 1430      MEDFICTS Scores   Pre Score  71       Nutrition Goals Re-Evaluation: Nutrition Goals Re-Evaluation    Row Name 12/09/19 1430             Goals   Current Weight  155 lb (70.3 kg)          Nutrition Goals Re-Evaluation: Nutrition Goals Re-Evaluation    Row Name 12/09/19 1430             Goals   Current Weight  155 lb (70.3 kg)          Nutrition Goals Discharge (Final Nutrition Goals Re-Evaluation): Nutrition Goals Re-Evaluation - 12/09/19 1430      Goals  Current Weight  155 lb (70.3 kg)       Psychosocial: Target Goals: Acknowledge presence or absence of significant depression and/or stress, maximize coping skills, provide positive support system. Participant is able to verbalize types and ability to use techniques and skills needed for reducing stress and depression.  Initial Review & Psychosocial Screening: Initial Psych Review & Screening - 11/24/19 1624      Initial Review   Current issues with  None Identified      Family Dynamics   Good Support System?  Yes   Ole lives alone but has his sister and son for support who live nearby     Barriers   Psychosocial barriers to participate in program  There are no identifiable barriers or psychosocial needs.      Screening Interventions   Interventions  Encouraged to exercise       Quality of Life Scores: Quality of Life - 11/24/19 1625      Quality of Life   Select  Quality of Life      Quality of Life Scores   Health/Function Pre  25.47 %    Socioeconomic Pre  23.07 %    Psych/Spiritual Pre  22.43 %    Family Pre  25.5 %    GLOBAL Pre  24.32 %      Scores of 19 and below usually indicate a poorer quality of life in these areas.  A difference of  2-3 points is a clinically meaningful difference.  A difference of 2-3 points in the  total score of the Quality of Life Index has been associated with significant improvement in overall quality of life, self-image, physical symptoms, and general health in studies assessing change in quality of life.  PHQ-9: Recent Review Flowsheet Data    Depression screen [facility] 2/9 11/24/2019   Decreased Interest 0   Down, Depressed, Hopeless 0   PHQ - 2 Score 0     Interpretation of Total Score  Total Score Depression Severity:  1-4 = Minimal depression, 5-9 = Mild depression, 10-14 = Moderate depression, 15-19 = Moderately severe depression, 20-27 = Severe depression   Psychosocial Evaluation and Intervention: Psychosocial Evaluation - 12/01/19 0728      Psychosocial Evaluation & Interventions   Interventions  Encouraged to exercise with the program and follow exercise prescription    Comments  Mr. Tedesco denies psychosocial barriers to participation in CR and self health management. He has a positive outlook and attitude. He does not have any real hobbies however he is able to manage any stress that may arise through his support system.    Expected Outcomes  Patient will continue to have a positive outlook and attitude.    Continue Psychosocial Services   No Follow up required       Psychosocial Re-Evaluation: Psychosocial Re-Evaluation    Row Name 12/09/19 1651 12/09/19 1652           Psychosocial Re-Evaluation   Current issues with  None Identified  --      Comments  Mr. Kirker denies psychosocial barriers to participation in CR and self health management. He has a positive outlook and attitude. He does not have any real hobbies however he is able to manage any stress that may arise through his support system.  --      Expected Outcomes  --  Patient will continue to have a positive outlook and attitude.      Interventions  --  Encouraged to attend Cardiac  Rehabilitation for the exercise      Continue Psychosocial Services   --  No Follow up required         Psychosocial  Discharge (Final Psychosocial Re-Evaluation): Psychosocial Re-Evaluation - 12/09/19 1652      Psychosocial Re-Evaluation   Expected Outcomes  Patient will continue to have a positive outlook and attitude.    Interventions  Encouraged to attend Cardiac Rehabilitation for the exercise    Continue Psychosocial Services   No Follow up required       Vocational Rehabilitation: Provide vocational rehab assistance to qualifying candidates.   Vocational Rehab Evaluation & Intervention: Vocational Rehab - 11/24/19 1632      Initial Vocational Rehab Evaluation & Intervention   Assessment shows need for Vocational Rehabilitation  --   Trystyn is retired and does not need vocational rehab at this time      Education: Education Goals: Education classes will be provided on a weekly basis, covering required topics. Participant will state understanding/return demonstration of topics presented.  Learning Barriers/Preferences: Learning Barriers/Preferences - 11/24/19 1627      Learning Barriers/Preferences   Learning Barriers  Sight;Hearing   wears glasses, mildly hard of hearing   Learning Preferences  Pictoral;Video       Education Topics: Count Your Pulse:  -Group instruction provided by verbal instruction, demonstration, patient participation and written materials to support subject.  Instructors address importance of being able to find your pulse and how to count your pulse when at home without a heart monitor.  Patients get hands on experience counting their pulse with staff help and individually.   Heart Attack, Angina, and Risk Factor Modification:  -Group instruction provided by verbal instruction, video, and written materials to support subject.  Instructors address signs and symptoms of angina and heart attacks.    Also discuss risk factors for heart disease and how to make changes to improve heart health risk factors.   Functional Fitness:  -Group instruction provided by verbal  instruction, demonstration, patient participation, and written materials to support subject.  Instructors address safety measures for doing things around the house.  Discuss how to get up and down off the floor, how to pick things up properly, how to safely get out of a chair without assistance, and balance training.   Meditation and Mindfulness:  -Group instruction provided by verbal instruction, patient participation, and written materials to support subject.  Instructor addresses importance of mindfulness and meditation practice to help reduce stress and improve awareness.  Instructor also leads participants through a meditation exercise.    Stretching for Flexibility and Mobility:  -Group instruction provided by verbal instruction, patient participation, and written materials to support subject.  Instructors lead participants through series of stretches that are designed to increase flexibility thus improving mobility.  These stretches are additional exercise for major muscle groups that are typically performed during regular warm up and cool down.   Hands Only CPR:  -Group verbal, video, and participation provides a basic overview of AHA guidelines for community CPR. Role-play of emergencies allow participants the opportunity to practice calling for help and chest compression technique with discussion of AED use.   Hypertension: -Group verbal and written instruction that provides a basic overview of hypertension including the most recent diagnostic guidelines, risk factor reduction with self-care instructions and medication management.    Nutrition I class: Heart Healthy Eating:  -Group instruction provided by PowerPoint slides, verbal discussion, and written materials to support subject matter. The instructor gives an  explanation and review of the Therapeutic Lifestyle Changes diet recommendations, which includes a discussion on lipid goals, dietary fat, sodium, fiber, plant stanol/sterol  esters, sugar, and the components of a well-balanced, healthy diet.   Nutrition II class: Lifestyle Skills:  -Group instruction provided by PowerPoint slides, verbal discussion, and written materials to support subject matter. The instructor gives an explanation and review of label reading, grocery shopping for heart health, heart healthy recipe modifications, and ways to make healthier choices when eating out.   Diabetes Question & Answer:  -Group instruction provided by PowerPoint slides, verbal discussion, and written materials to support subject matter. The instructor gives an explanation and review of diabetes co-morbidities, pre- and post-prandial blood glucose goals, pre-exercise blood glucose goals, signs, symptoms, and treatment of hypoglycemia and hyperglycemia, and foot care basics.   Diabetes Blitz:  -Group instruction provided by PowerPoint slides, verbal discussion, and written materials to support subject matter. The instructor gives an explanation and review of the physiology behind type 1 and type 2 diabetes, diabetes medications and rational behind using different medications, pre- and post-prandial blood glucose recommendations and Hemoglobin A1c goals, diabetes diet, and exercise including blood glucose guidelines for exercising safely.    Portion Distortion:  -Group instruction provided by PowerPoint slides, verbal discussion, written materials, and food models to support subject matter. The instructor gives an explanation of serving size versus portion size, changes in portions sizes over the last 20 years, and what consists of a serving from each food group.   Stress Management:  -Group instruction provided by verbal instruction, video, and written materials to support subject matter.  Instructors review role of stress in heart disease and how to cope with stress positively.     Exercising on Your Own:  -Group instruction provided by verbal instruction, power point, and  written materials to support subject.  Instructors discuss benefits of exercise, components of exercise, frequency and intensity of exercise, and end points for exercise.  Also discuss use of nitroglycerin and activating EMS.  Review options of places to exercise outside of rehab.  Review guidelines for sex with heart disease.   Cardiac Drugs I:  -Group instruction provided by verbal instruction and written materials to support subject.  Instructor reviews cardiac drug classes: antiplatelets, anticoagulants, beta blockers, and statins.  Instructor discusses reasons, side effects, and lifestyle considerations for each drug class.   Cardiac Drugs II:  -Group instruction provided by verbal instruction and written materials to support subject.  Instructor reviews cardiac drug classes: angiotensin converting enzyme inhibitors (ACE-I), angiotensin II receptor blockers (ARBs), nitrates, and calcium channel blockers.  Instructor discusses reasons, side effects, and lifestyle considerations for each drug class.   Anatomy and Physiology of the Circulatory System:  Group verbal and written instruction and models provide basic cardiac anatomy and physiology, with the coronary electrical and arterial systems. Review of: AMI, Angina, Valve disease, Heart Failure, Peripheral Artery Disease, Cardiac Arrhythmia, Pacemakers, and the ICD.   Other Education:  -Group or individual verbal, written, or video instructions that support the educational goals of the cardiac rehab program.   Holiday Eating Survival Tips:  -Group instruction provided by PowerPoint slides, verbal discussion, and written materials to support subject matter. The instructor gives patients tips, tricks, and techniques to help them not only survive but enjoy the holidays despite the onslaught of food that accompanies the holidays.   Knowledge Questionnaire Score: Knowledge Questionnaire Score - 11/24/19 1629      Knowledge Questionnaire  Score   Pre  Score  24/24       Core Components/Risk Factors/Patient Goals at Admission: Personal Goals and Risk Factors at Admission - 11/24/19 1629      Core Components/Risk Factors/Patient Goals on Admission    Weight Management  Yes;Weight Maintenance    Intervention  Weight Management: Develop a combined nutrition and exercise program designed to reach desired caloric intake, while maintaining appropriate intake of nutrient and fiber, sodium and fats, and appropriate energy expenditure required for the weight goal.;Weight Management: Provide education and appropriate resources to help participant work on and attain dietary goals.    Expected Outcomes  Short Term: Continue to assess and modify interventions until short term weight is achieved;Long Term: Adherence to nutrition and physical activity/exercise program aimed toward attainment of established weight goal;Weight Maintenance: Understanding of the daily nutrition guidelines, which includes 25-35% calories from fat, 7% or less cal from saturated fats, less than 200mg  cholesterol, less than 1.5gm of sodium, & 5 or more servings of fruits and vegetables daily;Understanding of distribution of calorie intake throughout the day with the consumption of 4-5 meals/snacks;Understanding recommendations for meals to include 15-35% energy as protein, 25-35% energy from fat, 35-60% energy from carbohydrates, less than 200mg  of dietary cholesterol, 20-35 gm of total fiber daily    Heart Failure  Yes    Intervention  Provide a combined exercise and nutrition program that is supplemented with education, support and counseling about heart failure. Directed toward relieving symptoms such as shortness of breath, decreased exercise tolerance, and extremity edema.    Expected Outcomes  Short term: Attendance in program 2-3 days a week with increased exercise capacity. Reported lower sodium intake. Reported increased fruit and vegetable intake. Reports medication  compliance.;Short term: Daily weights obtained and reported for increase. Utilizing diuretic protocols set by physician.;Long term: Adoption of self-care skills and reduction of barriers for early signs and symptoms recognition and intervention leading to self-care maintenance.       Core Components/Risk Factors/Patient Goals Review:  Goals and Risk Factor Review    Row Name 12/01/19 0730             Core Components/Risk Factors/Patient Goals Review   Personal Goals Review  Weight Management/Obesity;Heart Failure       Review  Mr. Alwyn RenHopper has two identifiable risk factors. His goals for participation in CR are to just "feel better" and to be able to get back to completing his ADLs without difficulty and to build his strength.       Expected Outcomes  Mr. Alwyn RenHopper will continue to participate in CR for risk factor modification          Core Components/Risk Factors/Patient Goals at Discharge (Final Review):  Goals and Risk Factor Review - 12/01/19 0730      Core Components/Risk Factors/Patient Goals Review   Personal Goals Review  Weight Management/Obesity;Heart Failure    Review  Mr. Alwyn RenHopper has two identifiable risk factors. His goals for participation in CR are to just "feel better" and to be able to get back to completing his ADLs without difficulty and to build his strength.    Expected Outcomes  Mr. Alwyn RenHopper will continue to participate in CR for risk factor modification       ITP Comments: ITP Comments    Row Name 11/24/19 1621 11/30/19 1447 12/09/19 1638       ITP Comments  Dr Armanda Magicraci Turner MD, Medical Director  Today, Mr. Alwyn RenHopper completed his first cardiac rehab exercise session. VSS. Tolerated exercise, working to  an RPE of 11-13 without cardiac complaints.  30 day ITP review: Mr. Awbrey has completed 4 exercise sessions since admission. VS remain stable and he continues to deny cardiac complaints. He continues to walk 15-20 min at home. His goal is to "get better" and "get  stronger". It is too soon into his CR program to evaluate progress towards goals. Will continue to monitor.        Comments: see ITP comments

## 2019-12-11 ENCOUNTER — Other Ambulatory Visit: Payer: Self-pay

## 2019-12-11 ENCOUNTER — Encounter (HOSPITAL_COMMUNITY)
Admission: RE | Admit: 2019-12-11 | Discharge: 2019-12-11 | Disposition: A | Discharge status: Home / Self Care | Payer: Medicare Other | Source: Ambulatory Visit | Attending: Internal Medicine | Admitting: Internal Medicine

## 2019-12-11 DIAGNOSIS — Z951 Presence of aortocoronary bypass graft: Secondary | ICD-10-CM

## 2019-12-14 ENCOUNTER — Other Ambulatory Visit: Payer: Self-pay

## 2019-12-14 ENCOUNTER — Encounter (HOSPITAL_COMMUNITY)
Admission: RE | Admit: 2019-12-14 | Discharge: 2019-12-14 | Disposition: A | Discharge status: Home / Self Care | Payer: Medicare Other | Source: Ambulatory Visit | Attending: Internal Medicine | Admitting: Internal Medicine

## 2019-12-14 DIAGNOSIS — Z951 Presence of aortocoronary bypass graft: Secondary | ICD-10-CM | POA: Diagnosis not present

## 2019-12-15 ENCOUNTER — Other Ambulatory Visit (HOSPITAL_COMMUNITY): Payer: Medicare Other

## 2019-12-16 ENCOUNTER — Other Ambulatory Visit: Payer: Self-pay

## 2019-12-16 ENCOUNTER — Encounter (HOSPITAL_COMMUNITY)
Admission: RE | Admit: 2019-12-16 | Discharge: 2019-12-16 | Disposition: A | Discharge status: Home / Self Care | Payer: Medicare Other | Source: Ambulatory Visit | Attending: Internal Medicine | Admitting: Internal Medicine

## 2019-12-16 DIAGNOSIS — Z951 Presence of aortocoronary bypass graft: Secondary | ICD-10-CM | POA: Diagnosis not present

## 2019-12-18 ENCOUNTER — Other Ambulatory Visit: Payer: Self-pay

## 2019-12-18 ENCOUNTER — Encounter (HOSPITAL_COMMUNITY)
Admission: RE | Admit: 2019-12-18 | Discharge: 2019-12-18 | Disposition: A | Discharge status: Home / Self Care | Payer: Medicare Other | Source: Ambulatory Visit | Attending: Internal Medicine | Admitting: Internal Medicine

## 2019-12-18 ENCOUNTER — Ambulatory Visit (HOSPITAL_COMMUNITY)
Admission: RE | Admit: 2019-12-18 | Discharge: 2019-12-18 | Disposition: A | Discharge status: Home / Self Care | Payer: Medicare Other | Source: Ambulatory Visit | Attending: Internal Medicine | Admitting: Internal Medicine

## 2019-12-18 DIAGNOSIS — I420 Dilated cardiomyopathy: Secondary | ICD-10-CM | POA: Diagnosis not present

## 2019-12-18 DIAGNOSIS — Z951 Presence of aortocoronary bypass graft: Secondary | ICD-10-CM

## 2019-12-18 DIAGNOSIS — I255 Ischemic cardiomyopathy: Secondary | ICD-10-CM | POA: Insufficient documentation

## 2019-12-18 LAB — BASIC METABOLIC PANEL
Anion gap: 9 (ref 5–15)
BUN: 18 mg/dL (ref 8–23)
CO2: 22 mmol/L (ref 22–32)
Calcium: 8.9 mg/dL (ref 8.9–10.3)
Chloride: 107 mmol/L (ref 98–111)
Creatinine, Ser: 0.8 mg/dL (ref 0.61–1.24)
GFR calc Af Amer: >60 mL/min (ref >60)
GFR calc non Af Amer: >60 mL/min (ref >60)
Glucose, Bld: 104 mg/dL — ABNORMAL HIGH (ref 70–99)
Potassium: 4.2 mmol/L (ref 3.5–5.1)
Sodium: 138 mmol/L (ref 135–145)

## 2019-12-21 ENCOUNTER — Other Ambulatory Visit: Payer: Self-pay

## 2019-12-21 ENCOUNTER — Encounter (HOSPITAL_COMMUNITY)
Admission: RE | Admit: 2019-12-21 | Discharge: 2019-12-21 | Disposition: A | Discharge status: Home / Self Care | Payer: Medicare Other | Source: Ambulatory Visit | Attending: Internal Medicine | Admitting: Internal Medicine

## 2019-12-21 DIAGNOSIS — Z951 Presence of aortocoronary bypass graft: Secondary | ICD-10-CM | POA: Diagnosis not present

## 2019-12-23 ENCOUNTER — Other Ambulatory Visit: Payer: Self-pay

## 2019-12-23 ENCOUNTER — Encounter (HOSPITAL_COMMUNITY)
Admission: RE | Admit: 2019-12-23 | Discharge: 2019-12-23 | Disposition: A | Discharge status: Home / Self Care | Payer: Medicare Other | Source: Ambulatory Visit | Attending: Internal Medicine | Admitting: Internal Medicine

## 2019-12-23 DIAGNOSIS — Z951 Presence of aortocoronary bypass graft: Secondary | ICD-10-CM | POA: Diagnosis not present

## 2019-12-25 ENCOUNTER — Other Ambulatory Visit: Payer: Self-pay

## 2019-12-25 ENCOUNTER — Encounter (HOSPITAL_COMMUNITY)
Admission: RE | Admit: 2019-12-25 | Discharge: 2019-12-25 | Disposition: A | Discharge status: Home / Self Care | Payer: Medicare Other | Source: Ambulatory Visit | Attending: Internal Medicine | Admitting: Internal Medicine

## 2019-12-25 DIAGNOSIS — Z951 Presence of aortocoronary bypass graft: Secondary | ICD-10-CM

## 2019-12-28 ENCOUNTER — Other Ambulatory Visit: Payer: Self-pay

## 2019-12-28 ENCOUNTER — Encounter (HOSPITAL_COMMUNITY)
Admission: RE | Admit: 2019-12-28 | Discharge: 2019-12-28 | Disposition: A | Discharge status: Home / Self Care | Payer: Medicare Other | Source: Ambulatory Visit | Attending: Internal Medicine | Admitting: Internal Medicine

## 2019-12-28 DIAGNOSIS — Z951 Presence of aortocoronary bypass graft: Secondary | ICD-10-CM

## 2019-12-29 ENCOUNTER — Encounter (HOSPITAL_COMMUNITY): Payer: Self-pay

## 2019-12-29 ENCOUNTER — Ambulatory Visit (HOSPITAL_COMMUNITY)
Admission: RE | Admit: 2019-12-29 | Discharge: 2019-12-29 | Disposition: A | Discharge status: Home / Self Care | Payer: Medicare Other | Source: Ambulatory Visit | Attending: Cardiology | Admitting: Cardiology

## 2019-12-29 VITALS — BP 132/74 | HR 87 | Wt 155.0 lb

## 2019-12-29 DIAGNOSIS — Z87891 Personal history of nicotine dependence: Secondary | ICD-10-CM | POA: Insufficient documentation

## 2019-12-29 DIAGNOSIS — I2510 Atherosclerotic heart disease of native coronary artery without angina pectoris: Secondary | ICD-10-CM | POA: Diagnosis not present

## 2019-12-29 DIAGNOSIS — Z882 Allergy status to sulfonamides status: Secondary | ICD-10-CM | POA: Insufficient documentation

## 2019-12-29 DIAGNOSIS — I5022 Chronic systolic (congestive) heart failure: Secondary | ICD-10-CM | POA: Insufficient documentation

## 2019-12-29 DIAGNOSIS — Z8249 Family history of ischemic heart disease and other diseases of the circulatory system: Secondary | ICD-10-CM | POA: Diagnosis not present

## 2019-12-29 DIAGNOSIS — Z951 Presence of aortocoronary bypass graft: Secondary | ICD-10-CM | POA: Insufficient documentation

## 2019-12-29 DIAGNOSIS — I255 Ischemic cardiomyopathy: Secondary | ICD-10-CM | POA: Diagnosis not present

## 2019-12-29 DIAGNOSIS — Z7982 Long term (current) use of aspirin: Secondary | ICD-10-CM | POA: Insufficient documentation

## 2019-12-29 DIAGNOSIS — Z79899 Other long term (current) drug therapy: Secondary | ICD-10-CM | POA: Insufficient documentation

## 2019-12-29 DIAGNOSIS — Z880 Allergy status to penicillin: Secondary | ICD-10-CM | POA: Insufficient documentation

## 2019-12-29 DIAGNOSIS — I4891 Unspecified atrial fibrillation: Secondary | ICD-10-CM | POA: Diagnosis not present

## 2019-12-29 LAB — BASIC METABOLIC PANEL
Anion gap: 11 (ref 5–15)
BUN: 22 mg/dL (ref 8–23)
CO2: 21 mmol/L — ABNORMAL LOW (ref 22–32)
Calcium: 9.4 mg/dL (ref 8.9–10.3)
Chloride: 105 mmol/L (ref 98–111)
Creatinine, Ser: 1 mg/dL (ref 0.61–1.24)
GFR calc Af Amer: >60 mL/min (ref >60)
GFR calc non Af Amer: >60 mL/min (ref >60)
Glucose, Bld: 97 mg/dL (ref 70–99)
Potassium: 4.4 mmol/L (ref 3.5–5.1)
Sodium: 137 mmol/L (ref 135–145)

## 2019-12-29 LAB — DIGOXIN LEVEL: Digoxin Level: 0.7 ng/mL — ABNORMAL LOW (ref 0.8–2.0)

## 2019-12-29 MED ORDER — ENTRESTO 24-26 MG PO TABS
1.0000 | ORAL_TABLET | Freq: Two times a day (BID) | ORAL | 6 refills | Status: DC
Start: 2019-12-29 — End: 2020-01-22

## 2019-12-29 NOTE — Patient Instructions (Signed)
STOP Losartan START Entresto 24/26 mg, one tab twice daily  Labs today We will only contact you if something comes back abnormal or we need to make some changes. Otherwise no news is good news!  Labs needed in 7-10 days  Your physician has requested that you have an echocardiogram. Echocardiography is a painless test that uses sound waves to create images of your heart. It provides your doctor with information about the size and shape of your heart and how well your hearts chambers and valves are working. This procedure takes approximately one hour. There are no restrictions for this procedure. -needed in 3-4 months   Your physician recommends that you schedule a follow-up appointment in: 3-4 weeks with pharmacy team  Your physician recommends that you schedule a follow-up appointment in: 3-4 months with Dr Gala Romney  Do the following things EVERYDAY: 1) Weigh yourself in the morning before breakfast. Write it down and keep it in a log. 2) Take your medicines as prescribed 3) Eat low salt foods--Limit salt (sodium) to 2000 mg per day.  4) Stay as active as you can everyday 5) Limit all fluids for the day to less than 2 liters  At the [facility], you and your health needs are our priority. As part of our continuing mission to provide you with exceptional heart care, we have created designated Provider Care Teams. These Care Teams include your primary Cardiologist (physician) and Advanced Practice Providers (APPs- Physician Assistants and Nurse Practitioners) who all work together to provide you with the care you need, when you need it.   You may see any of the following providers on your designated Care Team at your next follow up:  Dr Arvilla Meres  Dr Carron Curie, NP  Robbie Lis, [place]  Karle Plumber, PharmD   Please be sure to bring in all your medications bottles to every appointment.

## 2019-12-29 NOTE — Progress Notes (Signed)
[facility] Note   Referring Physician: PCP: Patient, No Pcp Per PCP-Cardiologist: Dr. Marlou Porch AHF: Dr. Haroldine Laws  CT Surgery: Dr. Julien Girt   Reason for Visit: F/u for Systolic Heart Failure/ ICM, med titration    HPI:  74 y/o male with no significant past medical history recently admitted to [facility] 08/4399 w/ acute systolic HF>>cardiogenic shock in the setting of severe multivessel CAD.   He had presented to the ER with a several day history of progressive shortness of breath.  He was seen in the ER by Dr. Marlou Porch and felt to have cardiogenic shock in the setting of probable multivessel coronary artery disease.  He developed progressive respiratory distress and was brought emergently to the catheterization lab. Started milrinone 0.25, given IV Lasix and started BiPAP. Did not require emergent intubation. EF <20% by echo. Urgent cardiac catheterization showed severe three-vessel disease with 99% left main.  Right heart cath showed low output with marked volume overload.  Impella was placed in cath lab for mechanical support.  Underwent CABG 3/13 (LIMA to LAD; SVG to Diag; SVG sequential to OM1 and OM2) by Dr. Julien Girt. He was continued on milrinone post operatively and able to wean off w/ stable co-ox. Was placed on GDMT for systolic HF. Able to tolerate low dose losartan, spiro and digoxin, however upward titration was limited by borderline hypotension. He had some post operative atrial fibrillation treated w/ amiodarone however no other significant post operative complications. He progressed well and was discharged home on on 3/25.   He returns to clinic today for repeat f/u. Has been seen several times since discharge for med titration, which has been slow, limited by soft BP. At last clinic visit w/ pharmD, losartan was increased to 25 mg daily. Arlyce Harman was previously increased to 25 mg daily in April but he had hyperkalemia w/ K at 5.2. Dose was reduced back to 12.5 mg daily and repeat  BMP showed normalized K at 4.5.   Today he reports feeling well. Tolerating losartan increase ok. No hypotension. Enrolled in cardiac rehab and exercising w/o much limitations/ exertional dyspnea. BP at cardiac rehab usually in the [id number] systolic, per his report. Denies dizziness. No LEE, orthopnea/PND. Daily wts have been stable. Only takes lasix PRN and has not had to use recently.   ROS reviewed and pertinent +/- s outlined in HPI above.   Past Medical History:  Diagnosis Date  . CHF (congestive heart failure) ([place])   . Coronary artery disease     Current Outpatient Medications  Medication Sig Dispense Refill  . aspirin EC 81 MG tablet Take 1 tablet (81 mg total) by mouth daily. 150 tablet 2  . carvedilol (COREG) 3.125 MG tablet Take 1 tablet (3.125 mg total) by mouth 2 (two) times daily. 60 tablet 11  . clopidogrel (PLAVIX) 75 MG tablet Take 1 tablet (75 mg total) by mouth daily. 30 tablet 11  . digoxin (LANOXIN) 0.125 MG tablet Take 1 tablet (0.125 mg total) by mouth daily. 90 tablet 3  . losartan (COZAAR) 25 MG tablet Take 1 tablet (25 mg total) by mouth at bedtime. 90 tablet 3  . Multiple Vitamin (MULTIVITAMIN) capsule Take 1 capsule by mouth daily.    Marland Kitchen OVER THE COUNTER MEDICATION Apply 1 application topically daily as needed (pain). CBD cream    . potassium chloride SA (KLOR-CON) 20 MEQ tablet Take 1 tablet (20 mEq total) by mouth as needed. With every dose of lasix 90 tablet 3  . spironolactone (  ALDACTONE) 25 MG tablet Take 0.5 tablets (12.5 mg total) by mouth daily. 15 tablet 1  . furosemide (LASIX) 40 MG tablet Take 1 tablet (40 mg total) by mouth daily as needed (for weight gain of 2 pounds or more in 24 hours). (Patient not taking: Reported on 12/29/2019) 15 tablet 1  . lactobacillus acidophilus (BACID) TABS tablet Take 1 tablet by mouth daily.  (Patient not taking: Reported on 12/29/2019)     No current facility-administered medications for this encounter.    Allergies    Allergen Reactions  . Penicillins   . Sulfa Antibiotics       Social History   Socioeconomic History  . Marital status: Divorced    Spouse name: Not on file  . Number of children: Not on file  . Years of education: 8  . Highest education level: Some college, no degree  Occupational History  . Not on file  Tobacco Use  . Smoking status: Former Smoker    Quit date: 1974    Years since quitting: 47.5  . Smokeless tobacco: Former Engineer, water and Sexual Activity  . Alcohol use: Not Currently    Comment: occ  . Drug use: Never  . Sexual activity: Not on file  Other Topics Concern  . Not on file  Social History Narrative  . Not on file   Social Determinants of Health   Financial Resource Strain:   . Difficulty of Paying Living Expenses:   Food Insecurity:   . Worried About Programme researcher, broadcasting/film/video in the Last Year:   . Barista in the Last Year:   Transportation Needs:   . Freight forwarder (Medical):   Marland Kitchen Lack of Transportation (Non-Medical):   Physical Activity:   . Days of Exercise per Week:   . Minutes of Exercise per Session:   Stress:   . Feeling of Stress :   Social Connections:   . Frequency of Communication with Friends and Family:   . Frequency of Social Gatherings with Friends and Family:   . Attends Religious Services:   . Active Member of Clubs or Organizations:   . Attends Banker Meetings:   Marland Kitchen Marital Status:   Intimate Partner Violence:   . Fear of Current or Ex-Partner:   . Emotionally Abused:   Marland Kitchen Physically Abused:   . Sexually Abused:       Family History  Problem Relation Age of Onset  . Healthy Mother   . Cancer Father   . Heart disease Father        bypass at 3    Vitals:   12/29/19 1400  BP: 132/74  Pulse: 87  SpO2: 99%  Weight: 70.3 kg (155 lb)   PHYSICAL EXAM: General:  Well appearing. No respiratory difficulty HEENT: normal Neck: supple. no JVD. Carotids 2+ bilat; no bruits. No lymphadenopathy  or thyromegaly appreciated. Cor: PMI nondisplaced. Regular rate & rhythm. No rubs, gallops or murmurs. Lungs: clear Abdomen: soft, nontender, nondistended. No hepatosplenomegaly. No bruits or masses. Good bowel sounds. Extremities: no cyanosis, clubbing, rash, edema Neuro: alert & oriented x 3, cranial nerves grossly intact. moves all 4 extremities w/o difficulty. Affect pleasant.   ECG: Not performed    ASSESSMENT & PLAN:  1. 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 Recent Admit 09/2019 for acute CHF/Cardiogenic Shock 2/2 severe multivessel CAD  - 09/18/19: Echo EF 20% RV ok - 09/18/19: Cath 3v CAD with 99% LM. Required Impella  support, inotropic therapy + CABG - 09/20/19: Limited Echo post CABG EF 20-25%  - NYHA II. Euvolemic on exam. Wt stable  - Continue Lasix + KCl on PRN basis  - Continue digoxin 0.125 mg daily. Check dig level today  - Continue Spironolactone 12.5 mg daily  (did not tolerate 25 mg dose due to hyperkalemia)  - Stop Losartan. Change to Entresto 24-26 mg bid  - Continue Coreg 3.125 mg bid  - Check BMP today and again in 7 days  - Consider SGLT2i next visit  - Plan repeat echo in 3 months after full med optimization. Refer to EP for ICD if EF remains < 35% - Advised to continue w/ daily wts   - continue w/ cardiac rehab   2. CAD - 09/18/19 cath with 3VD w/ LM involvement w/ 99%  - Underwent CABG 3/13 (LIMA to LAD; SVG to Diag; SVG sequential to OM1 and OM2) - No s/s ischemia  - On ASA, statin +  blocker   3. Post-op Afib - Dr. Vickey Sages stopped amio at post surgical f/u - RRR on exam - denies symptoms of breakthrough Afib - not on a/c given short duration   F/u w/ pharmD in 3-4 weeks for further med titration. Consider addition of [place].    Robbie Lis, PA-C 12/29/19

## 2019-12-29 NOTE — Progress Notes (Signed)
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  aspirin EC 81 MG tablet, Take 1 tablet (81 mg total) by mouth daily., Disp: 150 tablet, Rfl: 2 .  carvedilol (COREG) 3.125 MG tablet, Take 1 tablet (3.125 mg total) by mouth 2 (two) times daily., Disp: 60 tablet, Rfl: 11 .  clopidogrel (PLAVIX) 75 MG tablet, Take 1 tablet (75 mg total) by mouth daily., Disp: 30 tablet, Rfl: 11 .  digoxin (LANOXIN) 0.125 MG tablet, Take 1 tablet (0.125 mg total) by mouth daily., Disp: 90 tablet, Rfl: 3 .  furosemide (LASIX) 40 MG tablet, Take 1 tablet (40 mg total) by mouth daily as needed (for weight gain of 2 pounds or more in 24 hours). (Patient not taking: Reported on 12/29/2019), Disp: 15 tablet, Rfl: 1 .  lactobacillus acidophilus (BACID) TABS tablet, Take 1 tablet by mouth daily.  (Patient not taking: Reported on 12/29/2019), Disp: , Rfl:  .  Multiple Vitamin (MULTIVITAMIN) capsule, Take 1 capsule by mouth daily., Disp: , Rfl:  .  OVER THE COUNTER MEDICATION, Apply 1 application topically daily as needed (pain). CBD cream, Disp: , Rfl:  .  potassium chloride SA (KLOR-CON) 20 MEQ tablet, Take 1 tablet (20 mEq total) by mouth as needed. With every dose of lasix, Disp: 90 tablet, Rfl: 3 .  sacubitril-valsartan (ENTRESTO) 24-26 MG, Take 1 tablet by mouth 2 (two) times daily., Disp: 60 tablet, Rfl: 6 .  spironolactone (ALDACTONE) 25 MG tablet, Take 0.5 tablets (12.5 mg total) by mouth daily., Disp: 15  tablet, Rfl: 1  Past Medical History: Past Medical History:  Diagnosis Date  . CHF (congestive heart failure) (HCC)   . Coronary artery disease     Tobacco Use: Social History   Tobacco Use  Smoking Status Former Smoker  . Quit date: 16  . Years since quitting: 47.5  Smokeless Tobacco Former Emergency planning/management officer: Recent Airline pilot for [employer] Cardiac and Pulmonary Rehab Latest Ref Rng & Units 09/29/2019 09/30/2019 10/01/2019 10/01/2019 10/01/2019   Cholestrol 0 - 200 mg/dL - - - - -   LDLCALC 0 - 99 mg/dL - - - - -   HDL >40 mg/dL - - - - -   Trlycerides <150 mg/dL - - - - -   Hemoglobin A1c 4.8 - 5.6 % - - - - -   PHART 7.35 - 7.45 - - - - -   PCO2ART 32 - 48 mmHg - - - - -   HCO3 20.0 - 28.0 mmol/L - - - - -   TCO2 22 - 32 mmol/L - - - - -   ACIDBASEDEF 0.0 - 2.0 mmol/L - - - - -   O2SAT % 74.5 78.7 43.3 54.5 87.1      Capillary Blood Glucose: Lab Results  Component Value Date   GLUCAP 101 (H) 09/28/2019   GLUCAP 121 (H) 09/28/2019   GLUCAP 98 09/28/2019   GLUCAP  103 (H) 09/28/2019   GLUCAP 106 (H) 09/27/2019     Exercise Target Goals: Exercise Program Goal: Individual exercise prescription set using results from initial 6 min walk test and THRR while considering  patient's activity barriers and safety.   Exercise Prescription Goal: Initial exercise prescription builds to 30-45 minutes a day of aerobic activity, 2-3 days per week.  Home exercise guidelines will be given to patient during program as part of exercise prescription that the participant will acknowledge.  Activity Barriers & Risk Stratification:  Activity Barriers & Cardiac Risk Stratification - 11/24/19 1335      Activity Barriers & Cardiac Risk Stratification   Activity Barriers None    Cardiac Risk Stratification High           6 Minute Walk:  6 Minute Walk    Row Name 11/24/19 1355         6 Minute Walk   Phase Initial     Distance 1494 feet     Walk Time 6 minutes     #  of Rest Breaks 0     MPH 2.83     METS 3.07     RPE 11     Perceived Dyspnea  0     VO2 Peak 10.76     Symptoms No     Resting HR 81 bpm     Resting BP 118/72     Resting Oxygen Saturation  100 %     Exercise Oxygen Saturation  during 6 min walk 100 %     Max Ex. HR 102 bpm     Max Ex. BP 128/70     2 Minute Post BP 124/70            Oxygen Initial Assessment:   Oxygen Re-Evaluation:   Oxygen Discharge (Final Oxygen Re-Evaluation):   Initial Exercise Prescription:  Initial Exercise Prescription - 11/24/19 1500      Date of Initial Exercise RX and Referring Provider   Date 11/24/19    Referring Provider Jolaine Artist, MD    Expected Discharge Date 01/22/20      Recumbant Bike   Level 2    Watts 20    Minutes 15    METs 2.9      Arm Ergometer   Level 2    RPM 35    Minutes 15      Prescription Details   Frequency (times per week) 3    Duration Progress to 30 minutes of continuous aerobic without signs/symptoms of physical distress      Intensity   THRR 40-80% of Max Heartrate 59-118    Ratings of Perceived Exertion 11-13    Perceived Dyspnea 0-4      Progression   Progression Continue to progress workloads to maintain intensity without signs/symptoms of physical distress.      Resistance Training   Training Prescription Yes    Weight 3lbs    Reps 10-15           Perform Capillary Blood Glucose checks as needed.  Exercise Prescription Changes:   Exercise Prescription Changes    Row Name 11/30/19 1345 12/04/19 1348 12/23/19 1400         Response to Exercise   Blood Pressure (Admit) 130/66 114/72 100/52     Blood Pressure (Exercise) 148/82 128/62 122/60     Blood Pressure (Exit) 112/66 100/72 106/70     Heart Rate (Admit) 75 bpm 86 bpm 76 bpm  Heart Rate (Exercise) 107 bpm 98 bpm 104 bpm     Heart Rate (Exit) 83 bpm 83 bpm 76 bpm     Rating of Perceived Exertion (Exercise) 13 13 13      Symptoms none none None     Comments Off  to a good start with exercise. -- --     Duration Continue with 30 min of aerobic exercise without signs/symptoms of physical distress. Continue with 30 min of aerobic exercise without signs/symptoms of physical distress. Progress to 30 minutes of  aerobic without signs/symptoms of physical distress     Intensity THRR unchanged THRR unchanged THRR unchanged       Progression   Progression Continue to progress workloads to maintain intensity without signs/symptoms of physical distress. Continue to progress workloads to maintain intensity without signs/symptoms of physical distress. Continue to progress workloads to maintain intensity without signs/symptoms of physical distress.     Average METs 3.3 3.5 4       Resistance Training   Training Prescription Yes Yes Yes     Weight 3lbs 3lbs 4  Wts not done today but will increase to 4lbs next session     Reps 10-15 10-15 10-15     Time 10 Minutes 10 Minutes 0 Minutes       Interval Training   Interval Training No No No       Recumbant Bike   Level 2 2 2.5     Watts 20 -- 20     Minutes 15 15 15      METs 3.3 3.5 4       Arm Ergometer   Level 2 2 2      RPM 35 35 35     Minutes 15 15 15        Home Exercise Plan   Plans to continue exercise at -- -- Home (comment)     Frequency -- -- Add 3 additional days to program exercise sessions.     Initial Home Exercises Provided -- -- 12/09/19            Exercise Comments:   Exercise Comments    Row Name 11/30/19 1437 12/09/19 1331 12/23/19 1444       Exercise Comments Patient tolerated 1st session of exercise well without symptoms. Reviewed home exercise guidelines, METs, and goals with patient. Reviewed home exercise guidelines, METs, and goals with patient.            Exercise Goals and Review:   Exercise Goals    Row Name 11/24/19 1336             Exercise Goals   Increase Physical Activity Yes       Intervention Provide advice, education, support and counseling about  physical activity/exercise needs.;Develop an individualized exercise prescription for aerobic and resistive training based on initial evaluation findings, risk stratification, comorbidities and participant's personal goals.       Expected Outcomes Short Term: Attend rehab on a regular basis to increase amount of physical activity.;Long Term: Exercising regularly at least 3-5 days a week.;Long Term: Add in home exercise to make exercise part of routine and to increase amount of physical activity.       Increase Strength and Stamina Yes       Intervention Provide advice, education, support and counseling about physical activity/exercise needs.;Develop an individualized exercise prescription for aerobic and resistive training based on initial evaluation findings, risk stratification, comorbidities and participant's personal goals.       Expected  Outcomes Short Term: Increase workloads from initial exercise prescription for resistance, speed, and METs.;Short Term: Perform resistance training exercises routinely during rehab and add in resistance training at home;Long Term: Improve cardiorespiratory fitness, muscular endurance and strength as measured by increased METs and functional capacity (6MWT)       Able to understand and use rate of perceived exertion (RPE) scale Yes       Intervention Provide education and explanation on how to use RPE scale       Expected Outcomes Short Term: Able to use RPE daily in rehab to express subjective intensity level;Long Term:  Able to use RPE to guide intensity level when exercising independently       Knowledge and understanding of Target Heart Rate Range (THRR) Yes       Intervention Provide education and explanation of THRR including how the numbers were predicted and where they are located for reference       Expected Outcomes Short Term: Able to state/look up THRR;Long Term: Able to use THRR to govern intensity when exercising independently;Short Term: Able to use  daily as guideline for intensity in rehab       Able to check pulse independently Yes       Intervention Provide education and demonstration on how to check pulse in carotid and radial arteries.;Review the importance of being able to check your own pulse for safety during independent exercise       Expected Outcomes Short Term: Able to explain why pulse checking is important during independent exercise;Long Term: Able to check pulse independently and accurately       Understanding of Exercise Prescription Yes       Intervention Provide education, explanation, and written materials on patient's individual exercise prescription       Expected Outcomes Short Term: Able to explain program exercise prescription;Long Term: Able to explain home exercise prescription to exercise independently              Exercise Goals Re-Evaluation :  Exercise Goals Re-Evaluation    Row Name 11/30/19 1437 12/09/19 1331 12/23/19 1445         Exercise Goal Re-Evaluation   Exercise Goals Review Increase Physical Activity;Able to understand and use rate of perceived exertion (RPE) scale Increase Physical Activity;Able to understand and use rate of perceived exertion (RPE) scale;Able to check pulse independently;Knowledge and understanding of Target Heart Rate Range (THRR);Increase Strength and Stamina;Understanding of Exercise Prescription Increase Physical Activity;Increase Strength and Stamina;Able to understand and use rate of perceived exertion (RPE) scale;Knowledge and understanding of Target Heart Rate Range (THRR);Able to check pulse independently;Understanding of Exercise Prescription     Comments Patient able to understand and use RPE scale appropriately. Reviewed home exercise guidelines with patient including endpoints, temperature precautions, target heart rate and rate of perceived exertion. Pt is riding his stationary bike or walking 5-6 minutes 4 days/week as his mode of home exercise. Encouraged patient to  increase duration, and he is amenable to this. Pt also has weights at home of varying sizes for his resistance training. Pt knows how to manually count his pulse. Pt also states he's active doing yard work without difficulty. Pt voices understanding of instructions given. Pt continues to use his stationary bike for 10-15 minutes and walks 15 minutes 3-4x/week. He also continues with free weights as well as yard work and mowing grass. Re-enforced guidelines for temperature and hydration with activity.     Expected Outcomes Increase workloads as tolerated to help increase  cardiorespiratory fitness. Patient will increase exercise duration from 5-6 minutes to 30 minutes at least 2 days/week in addition to exercise at cardiac rehab. Pt will continue to increase time with bike or walking to goal of 30 minutes 2-3x/week.            Discharge Exercise Prescription (Final Exercise Prescription Changes):  Exercise Prescription Changes - 12/23/19 1400      Response to Exercise   Blood Pressure (Admit) 100/52    Blood Pressure (Exercise) 122/60    Blood Pressure (Exit) 106/70    Heart Rate (Admit) 76 bpm    Heart Rate (Exercise) 104 bpm    Heart Rate (Exit) 76 bpm    Rating of Perceived Exertion (Exercise) 13    Symptoms None    Duration Progress to 30 minutes of  aerobic without signs/symptoms of physical distress    Intensity THRR unchanged      Progression   Progression Continue to progress workloads to maintain intensity without signs/symptoms of physical distress.    Average METs 4      Resistance Training   Training Prescription Yes    Weight 4   Wts not done today but will increase to 4lbs next session   Reps 10-15    Time 0 Minutes      Interval Training   Interval Training No      Recumbant Bike   Level 2.5    Watts 20    Minutes 15    METs 4      Arm Ergometer   Level 2    RPM 35    Minutes 15      Home Exercise Plan   Plans to continue exercise at Home (comment)     Frequency Add 3 additional days to program exercise sessions.    Initial Home Exercises Provided 12/09/19           Nutrition:  Target Goals: Understanding of nutrition guidelines, daily intake of sodium 1500mg , cholesterol 200mg , calories 30% from fat and 7% or less from saturated fats, daily to have 5 or more servings of fruits and vegetables.  Biometrics:  Pre Biometrics - 11/24/19 1335      Pre Biometrics   Height 5' 4.25" (1.632 m)    Weight 70.5 kg    Waist Circumference 36.5 inches    Hip Circumference 38.75 inches    Waist to Hip Ratio 0.94 %    BMI (Calculated) 26.47    Triceps Skinfold 13 mm    % Body Fat 25.4 %    Grip Strength 29 kg    Flexibility 11 in    Single Leg Stand 4.25 seconds            Nutrition Therapy Plan and Nutrition Goals:  Nutrition Therapy & Goals - 12/09/19 1430      Nutrition Therapy   Diet Heart Healthy      Personal Nutrition Goals   Nutrition Goal Pt to build a healthy plate including vegetables, fruits, whole grains, and low-fat dairy products in a heart healthy meal plan.      Intervention Plan   Intervention Nutrition handout(s) given to patient.;Prescribe, educate and counsel regarding individualized specific dietary modifications aiming towards targeted core components such as weight, hypertension, lipid management, diabetes, heart failure and other comorbidities.    Expected Outcomes Short Term Goal: Understand basic principles of dietary content, such as calories, fat, sodium, cholesterol and nutrients.           Nutrition  Assessments:  Nutrition Assessments - 12/09/19 1430      MEDFICTS Scores   Pre Score 71           Nutrition Goals Re-Evaluation:  Nutrition Goals Re-Evaluation    Row Name 12/09/19 1430 12/31/19 0702           Goals   Current Weight 155 lb (70.3 kg) 156 lb 1.4 oz (70.8 kg)      Nutrition Goal -- Pt to build a healthy plate including vegetables, fruits, whole grains, and low-fat dairy  products in a heart healthy meal plan.      Comment -- Pt limiting sodium by reading labels             Nutrition Goals Re-Evaluation:  Nutrition Goals Re-Evaluation    Row Name 12/09/19 1430 12/31/19 0702           Goals   Current Weight 155 lb (70.3 kg) 156 lb 1.4 oz (70.8 kg)      Nutrition Goal -- Pt to build a healthy plate including vegetables, fruits, whole grains, and low-fat dairy products in a heart healthy meal plan.      Comment -- Pt limiting sodium by reading labels             Nutrition Goals Discharge (Final Nutrition Goals Re-Evaluation):  Nutrition Goals Re-Evaluation - 12/31/19 0702      Goals   Current Weight 156 lb 1.4 oz (70.8 kg)    Nutrition Goal Pt to build a healthy plate including vegetables, fruits, whole grains, and low-fat dairy products in a heart healthy meal plan.    Comment Pt limiting sodium by reading labels           Psychosocial: Target Goals: Acknowledge presence or absence of significant depression and/or stress, maximize coping skills, provide positive support system. Participant is able to verbalize types and ability to use techniques and skills needed for reducing stress and depression.  Initial Review & Psychosocial Screening:  Initial Psych Review & Screening - 11/24/19 1624      Initial Review   Current issues with None Identified      Family Dynamics   Good Support System? Yes   Dennis Roberts lives alone but has his sister and son for support who live nearby     Barriers   Psychosocial barriers to participate in program There are no identifiable barriers or psychosocial needs.      Screening Interventions   Interventions Encouraged to exercise           Quality of Life Scores:  Quality of Life - 11/24/19 1625      Quality of Life   Select Quality of Life      Quality of Life Scores   Health/Function Pre 25.47 %    Socioeconomic Pre 23.07 %    Psych/Spiritual Pre 22.43 %    Family Pre 25.5 %    GLOBAL Pre 24.32 %           Scores of 19 and below usually indicate a poorer quality of life in these areas.  A difference of  2-3 points is a clinically meaningful difference.  A difference of 2-3 points in the total score of the Quality of Life Index has been associated with significant improvement in overall quality of life, self-image, physical symptoms, and general health in studies assessing change in quality of life.  PHQ-9: Recent Review Flowsheet Data    Depression screen [facility] 2/9 11/24/2019   Decreased Interest  0   Down, Depressed, Hopeless 0   PHQ - 2 Score 0     Interpretation of Total Score  Total Score Depression Severity:  1-4 = Minimal depression, 5-9 = Mild depression, 10-14 = Moderate depression, 15-19 = Moderately severe depression, 20-27 = Severe depression   Psychosocial Evaluation and Intervention:  Psychosocial Evaluation - 12/01/19 0728      Psychosocial Evaluation & Interventions   Interventions Encouraged to exercise with the program and follow exercise prescription    Comments Dennis Roberts denies psychosocial barriers to participation in CR and self health management. He has a positive outlook and attitude. He does not have any real hobbies however he is able to manage any stress that may arise through his support system.    Expected Outcomes Patient will continue to have a positive outlook and attitude.    Continue Psychosocial Services  No Follow up required           Psychosocial Re-Evaluation:  Psychosocial Re-Evaluation    Row Name 12/09/19 1651 12/09/19 1652 12/29/19 1424         Psychosocial Re-Evaluation   Current issues with None Identified -- None Identified     Comments Dennis Roberts denies psychosocial barriers to participation in CR and self health management. He has a positive outlook and attitude. He does not have any real hobbies however he is able to manage any stress that may arise through his support system. -- Dennis Roberts denies psychosocial barriers to  participation in CR and self health management. He has a positive outlook and attitude. He does not have any real hobbies however he is able to manage any stress that may arise through his support system.     Expected Outcomes -- Patient will continue to have a positive outlook and attitude. Patient will continue to have a positive outlook and attitude.     Interventions -- Encouraged to attend Cardiac Rehabilitation for the exercise Encouraged to attend Cardiac Rehabilitation for the exercise     Continue Psychosocial Services  -- No Follow up required No Follow up required            Psychosocial Discharge (Final Psychosocial Re-Evaluation):  Psychosocial Re-Evaluation - 12/29/19 1424      Psychosocial Re-Evaluation   Current issues with None Identified    Comments Dennis Roberts denies psychosocial barriers to participation in CR and self health management. He has a positive outlook and attitude. He does not have any real hobbies however he is able to manage any stress that may arise through his support system.    Expected Outcomes Patient will continue to have a positive outlook and attitude.    Interventions Encouraged to attend Cardiac Rehabilitation for the exercise    Continue Psychosocial Services  No Follow up required           Vocational Rehabilitation: Provide vocational rehab assistance to qualifying candidates.   Vocational Rehab Evaluation & Intervention:  Vocational Rehab - 11/24/19 1632      Initial Vocational Rehab Evaluation & Intervention   Assessment shows need for Vocational Rehabilitation --   Dennis Roberts is retired and does not need vocational rehab at this time          Education: Education Goals: Education classes will be provided on a weekly basis, covering required topics. Participant will state understanding/return demonstration of topics presented.  Learning Barriers/Preferences:  Learning Barriers/Preferences - 11/24/19 1627      Learning  Barriers/Preferences   Learning Barriers Sight;Hearing  wears glasses, mildly hard of hearing   Learning Preferences Pictoral;Video           Education Topics: Count Your Pulse:  -Group instruction provided by verbal instruction, demonstration, patient participation and written materials to support subject.  Instructors address importance of being able to find your pulse and how to count your pulse when at home without a heart monitor.  Patients get hands on experience counting their pulse with staff help and individually.   Heart Attack, Angina, and Risk Factor Modification:  -Group instruction provided by verbal instruction, video, and written materials to support subject.  Instructors address signs and symptoms of angina and heart attacks.    Also discuss risk factors for heart disease and how to make changes to improve heart health risk factors.   Functional Fitness:  -Group instruction provided by verbal instruction, demonstration, patient participation, and written materials to support subject.  Instructors address safety measures for doing things around the house.  Discuss how to get up and down off the floor, how to pick things up properly, how to safely get out of a chair without assistance, and balance training.   Meditation and Mindfulness:  -Group instruction provided by verbal instruction, patient participation, and written materials to support subject.  Instructor addresses importance of mindfulness and meditation practice to help reduce stress and improve awareness.  Instructor also leads participants through a meditation exercise.    Stretching for Flexibility and Mobility:  -Group instruction provided by verbal instruction, patient participation, and written materials to support subject.  Instructors lead participants through series of stretches that are designed to increase flexibility thus improving mobility.  These stretches are additional exercise for major muscle  groups that are typically performed during regular warm up and cool down.   Hands Only CPR:  -Group verbal, video, and participation provides a basic overview of AHA guidelines for community CPR. Role-play of emergencies allow participants the opportunity to practice calling for help and chest compression technique with discussion of AED use.   Hypertension: -Group verbal and written instruction that provides a basic overview of hypertension including the most recent diagnostic guidelines, risk factor reduction with self-care instructions and medication management.    Nutrition I class: Heart Healthy Eating:  -Group instruction provided by PowerPoint slides, verbal discussion, and written materials to support subject matter. The instructor gives an explanation and review of the Therapeutic Lifestyle Changes diet recommendations, which includes a discussion on lipid goals, dietary fat, sodium, fiber, plant stanol/sterol esters, sugar, and the components of a well-balanced, healthy diet.   Nutrition II class: Lifestyle Skills:  -Group instruction provided by PowerPoint slides, verbal discussion, and written materials to support subject matter. The instructor gives an explanation and review of label reading, grocery shopping for heart health, heart healthy recipe modifications, and ways to make healthier choices when eating out.   Diabetes Question & Answer:  -Group instruction provided by PowerPoint slides, verbal discussion, and written materials to support subject matter. The instructor gives an explanation and review of diabetes co-morbidities, pre- and post-prandial blood glucose goals, pre-exercise blood glucose goals, signs, symptoms, and treatment of hypoglycemia and hyperglycemia, and foot care basics.   Diabetes Blitz:  -Group instruction provided by PowerPoint slides, verbal discussion, and written materials to support subject matter. The instructor gives an explanation and review of  the physiology behind type 1 and type 2 diabetes, diabetes medications and rational behind using different medications, pre- and post-prandial blood glucose recommendations and Hemoglobin A1c goals, diabetes diet,  and exercise including blood glucose guidelines for exercising safely.    Portion Distortion:  -Group instruction provided by PowerPoint slides, verbal discussion, written materials, and food models to support subject matter. The instructor gives an explanation of serving size versus portion size, changes in portions sizes over the last 20 years, and what consists of a serving from each food group.   Stress Management:  -Group instruction provided by verbal instruction, video, and written materials to support subject matter.  Instructors review role of stress in heart disease and how to cope with stress positively.     Exercising on Your Own:  -Group instruction provided by verbal instruction, power point, and written materials to support subject.  Instructors discuss benefits of exercise, components of exercise, frequency and intensity of exercise, and end points for exercise.  Also discuss use of nitroglycerin and activating EMS.  Review options of places to exercise outside of rehab.  Review guidelines for sex with heart disease.   Cardiac Drugs I:  -Group instruction provided by verbal instruction and written materials to support subject.  Instructor reviews cardiac drug classes: antiplatelets, anticoagulants, beta blockers, and statins.  Instructor discusses reasons, side effects, and lifestyle considerations for each drug class.   Cardiac Drugs II:  -Group instruction provided by verbal instruction and written materials to support subject.  Instructor reviews cardiac drug classes: angiotensin converting enzyme inhibitors (ACE-I), angiotensin II receptor blockers (ARBs), nitrates, and calcium channel blockers.  Instructor discusses reasons, side effects, and lifestyle  considerations for each drug class.   Anatomy and Physiology of the Circulatory System:  Group verbal and written instruction and models provide basic cardiac anatomy and physiology, with the coronary electrical and arterial systems. Review of: AMI, Angina, Valve disease, Heart Failure, Peripheral Artery Disease, Cardiac Arrhythmia, Pacemakers, and the ICD.   Other Education:  -Group or individual verbal, written, or video instructions that support the educational goals of the cardiac rehab program.   Holiday Eating Survival Tips:  -Group instruction provided by PowerPoint slides, verbal discussion, and written materials to support subject matter. The instructor gives patients tips, tricks, and techniques to help them not only survive but enjoy the holidays despite the onslaught of food that accompanies the holidays.   Knowledge Questionnaire Score:  Knowledge Questionnaire Score - 11/24/19 1629      Knowledge Questionnaire Score   Pre Score 24/24           Core Components/Risk Factors/Patient Goals at Admission:  Personal Goals and Risk Factors at Admission - 11/24/19 1629      Core Components/Risk Factors/Patient Goals on Admission    Weight Management Yes;Weight Maintenance    Intervention Weight Management: Develop a combined nutrition and exercise program designed to reach desired caloric intake, while maintaining appropriate intake of nutrient and fiber, sodium and fats, and appropriate energy expenditure required for the weight goal.;Weight Management: Provide education and appropriate resources to help participant work on and attain dietary goals.    Expected Outcomes Short Term: Continue to assess and modify interventions until short term weight is achieved;Long Term: Adherence to nutrition and physical activity/exercise program aimed toward attainment of established weight goal;Weight Maintenance: Understanding of the daily nutrition guidelines, which includes 25-35% calories  from fat, 7% or less cal from saturated fats, less than  cholesterol, less than 1.5gm of sodium, & 5 or more servings of fruits and vegetables daily;Understanding of distribution of calorie intake throughout the day with the consumption of 4-5 meals/snacks;Understanding recommendations for meals to include 15-35% energy  as protein, 25-35% energy from fat, 35-60% energy from carbohydrates, less than 200mg  of dietary cholesterol, 20-35 gm of total fiber daily    Heart Failure Yes    Intervention Provide a combined exercise and nutrition program that is supplemented with education, support and counseling about heart failure. Directed toward relieving symptoms such as shortness of breath, decreased exercise tolerance, and extremity edema.    Expected Outcomes Short term: Attendance in program 2-3 days a week with increased exercise capacity. Reported lower sodium intake. Reported increased fruit and vegetable intake. Reports medication compliance.;Short term: Daily weights obtained and reported for increase. Utilizing diuretic protocols set by physician.;Long term: Adoption of self-care skills and reduction of barriers for early signs and symptoms recognition and intervention leading to self-care maintenance.           Core Components/Risk Factors/Patient Goals Review:   Goals and Risk Factor Review    Row Name 12/01/19 0730 12/29/19 1424           Core Components/Risk Factors/Patient Goals Review   Personal Goals Review Weight Management/Obesity;Heart Failure --      Review Dennis Roberts has two identifiable risk factors. His goals for participation in CR are to just "feel better" and to be able to get back to completing his ADLs without difficulty and to build his strength. Dennis Roberts has two identifiable risk factors. His goals for participation in CR are to just "feel better" and to be able to get back to completing his ADLs without difficulty and to build his strength. States he is progressing  towards these goals      Expected Outcomes Dennis Roberts will continue to participate in CR for risk factor modification Dennis Roberts will continue to participate in CR for risk factor modification             Core Components/Risk Factors/Patient Goals at Discharge (Final Review):   Goals and Risk Factor Review - 12/29/19 1424      Core Components/Risk Factors/Patient Goals Review   Review Dennis Roberts has two identifiable risk factors. His goals for participation in CR are to just "feel better" and to be able to get back to completing his ADLs without difficulty and to build his strength. States he is progressing towards these goals    Expected Outcomes Dennis Roberts will continue to participate in CR for risk factor modification           ITP Comments:  ITP Comments    Row Name 11/24/19 1621 11/30/19 1447 12/09/19 1638 12/29/19 1423     ITP Comments Dr 12/31/19 MD, Medical Director Today, Dennis Roberts completed his first cardiac rehab exercise session. VSS. Tolerated exercise, working to an RPE of 11-13 without cardiac complaints. 30 day ITP review: Dennis Roberts has completed 4 exercise sessions since admission. VS remain stable and he continues to deny cardiac complaints. He continues to walk 15-20 min at home. His goal is to "get better" and "get stronger". It is too soon into his CR program to evaluate progress towards goals. Will continue to monitor. 30 day ITP review: Dennis Roberts has completed 12 exercise sessions since admission. VS remain stable and he continues to deny cardiac complaints. He continues to walk 15-20 min at home. His goal is to "get better" and "get stronger". Dennis Roberts feels he is progressing towards these goals.           Comments: see ITP comments

## 2019-12-30 ENCOUNTER — Encounter (HOSPITAL_COMMUNITY)
Admission: RE | Admit: 2019-12-30 | Discharge: 2019-12-30 | Disposition: A | Discharge status: Home / Self Care | Payer: Medicare Other | Source: Ambulatory Visit | Attending: Internal Medicine | Admitting: Internal Medicine

## 2019-12-30 ENCOUNTER — Other Ambulatory Visit: Payer: Self-pay

## 2019-12-30 VITALS — Ht 64.25 in | Wt 156.1 lb

## 2019-12-30 DIAGNOSIS — Z951 Presence of aortocoronary bypass graft: Secondary | ICD-10-CM

## 2020-01-01 ENCOUNTER — Other Ambulatory Visit: Payer: Self-pay

## 2020-01-01 ENCOUNTER — Encounter (HOSPITAL_COMMUNITY)
Admission: RE | Admit: 2020-01-01 | Discharge: 2020-01-01 | Disposition: A | Discharge status: Home / Self Care | Payer: Medicare Other | Source: Ambulatory Visit | Attending: Internal Medicine | Admitting: Internal Medicine

## 2020-01-01 DIAGNOSIS — Z951 Presence of aortocoronary bypass graft: Secondary | ICD-10-CM | POA: Diagnosis not present

## 2020-01-04 ENCOUNTER — Other Ambulatory Visit: Payer: Self-pay

## 2020-01-04 ENCOUNTER — Encounter (HOSPITAL_COMMUNITY)
Admission: RE | Admit: 2020-01-04 | Discharge: 2020-01-04 | Disposition: A | Discharge status: Home / Self Care | Payer: Medicare Other | Source: Ambulatory Visit | Attending: Internal Medicine | Admitting: Internal Medicine

## 2020-01-04 DIAGNOSIS — Z951 Presence of aortocoronary bypass graft: Secondary | ICD-10-CM

## 2020-01-06 ENCOUNTER — Encounter (HOSPITAL_COMMUNITY)
Admission: RE | Admit: 2020-01-06 | Discharge: 2020-01-06 | Disposition: A | Discharge status: Home / Self Care | Payer: Medicare Other | Source: Ambulatory Visit | Attending: Internal Medicine | Admitting: Internal Medicine

## 2020-01-06 ENCOUNTER — Other Ambulatory Visit: Payer: Self-pay

## 2020-01-06 DIAGNOSIS — Z951 Presence of aortocoronary bypass graft: Secondary | ICD-10-CM | POA: Diagnosis not present

## 2020-01-08 ENCOUNTER — Ambulatory Visit (HOSPITAL_COMMUNITY)
Admission: RE | Admit: 2020-01-08 | Discharge: 2020-01-08 | Disposition: A | Discharge status: Home / Self Care | Payer: Medicare Other | Source: Ambulatory Visit | Attending: Internal Medicine | Admitting: Internal Medicine

## 2020-01-08 ENCOUNTER — Other Ambulatory Visit: Payer: Self-pay

## 2020-01-08 ENCOUNTER — Encounter (HOSPITAL_COMMUNITY)
Admission: RE | Admit: 2020-01-08 | Discharge: 2020-01-08 | Disposition: A | Discharge status: Home / Self Care | Payer: Medicare Other | Source: Ambulatory Visit | Attending: Internal Medicine | Admitting: Internal Medicine

## 2020-01-08 DIAGNOSIS — Z951 Presence of aortocoronary bypass graft: Secondary | ICD-10-CM | POA: Insufficient documentation

## 2020-01-08 DIAGNOSIS — I5022 Chronic systolic (congestive) heart failure: Secondary | ICD-10-CM | POA: Diagnosis not present

## 2020-01-08 LAB — BASIC METABOLIC PANEL
Anion gap: 8 (ref 5–15)
BUN: 16 mg/dL (ref 8–23)
CO2: 22 mmol/L (ref 22–32)
Calcium: 9.1 mg/dL (ref 8.9–10.3)
Chloride: 107 mmol/L (ref 98–111)
Creatinine, Ser: 0.82 mg/dL (ref 0.61–1.24)
GFR calc Af Amer: >60 mL/min (ref >60)
GFR calc non Af Amer: >60 mL/min (ref >60)
Glucose, Bld: 100 mg/dL — ABNORMAL HIGH (ref 70–99)
Potassium: 4.7 mmol/L (ref 3.5–5.1)
Sodium: 137 mmol/L (ref 135–145)

## 2020-01-13 ENCOUNTER — Encounter (HOSPITAL_COMMUNITY)
Admission: RE | Admit: 2020-01-13 | Discharge: 2020-01-13 | Disposition: A | Discharge status: Home / Self Care | Payer: Medicare Other | Source: Ambulatory Visit | Attending: Internal Medicine | Admitting: Internal Medicine

## 2020-01-13 ENCOUNTER — Other Ambulatory Visit: Payer: Self-pay

## 2020-01-13 DIAGNOSIS — Z951 Presence of aortocoronary bypass graft: Secondary | ICD-10-CM

## 2020-01-13 NOTE — Progress Notes (Signed)
Nutrition Note -Follow up Spoke with pt. He has swapped refined grains for whole grains. He is choosing whole wheat toast instead of biscuits for breakfast, making sandwiches with whole grain bread. He's snacking on nuts instead of chips. He started drinking low sodium V8 to supplement his veggie intake. He tries to add veggies to dinner most days.  Struggles to get fruit in because it often goes bad. We discussed no sugar added canned fruits, unsweetened applesauce, and choosing only 1 fruit each week to reduce spoilage. He continues to read labels to reduce saturated fat, sodium, sugar and increase fiber. Pt has made many diet changes towards a heart healthy diet.   Nutrition Diagnosis   Food-and nutrition-related knowledge deficit related to lack of exposure to information as related to diagnosis of: ? CVD ?   Nutrition Intervention   Pt's individual nutrition plan reviewed with pt.  Benefits of adopting Heart Healthy diet discussed when Med[employer] reviewed.                  Pt given handouts for: ? Nutrition I class ? Nutrition II class ?  Continue client-centered nutrition education by RD, as part of interdisciplinary care.  Goal(s)  Pt to build a healthy plate including vegetables, fruits, whole grains, and low-fat dairy products in a heart healthy meal plan.  Plan:  Will provide client-centered nutrition education as part of interdisciplinary care  Monitor and evaluate progress toward nutrition goal with team.   Andrey Campanile, MS, RDN, LDN

## 2020-01-15 ENCOUNTER — Encounter (HOSPITAL_COMMUNITY)
Admission: RE | Admit: 2020-01-15 | Discharge: 2020-01-15 | Disposition: A | Discharge status: Home / Self Care | Payer: Medicare Other | Source: Ambulatory Visit | Attending: Internal Medicine | Admitting: Internal Medicine

## 2020-01-15 ENCOUNTER — Other Ambulatory Visit: Payer: Self-pay

## 2020-01-15 VITALS — Ht 64.25 in | Wt 158.1 lb

## 2020-01-15 DIAGNOSIS — Z951 Presence of aortocoronary bypass graft: Secondary | ICD-10-CM

## 2020-01-18 ENCOUNTER — Encounter (HOSPITAL_COMMUNITY)
Admission: RE | Admit: 2020-01-18 | Discharge: 2020-01-18 | Disposition: A | Discharge status: Home / Self Care | Payer: Medicare Other | Source: Ambulatory Visit | Attending: Internal Medicine | Admitting: Internal Medicine

## 2020-01-18 ENCOUNTER — Other Ambulatory Visit: Payer: Self-pay

## 2020-01-18 DIAGNOSIS — Z951 Presence of aortocoronary bypass graft: Secondary | ICD-10-CM | POA: Diagnosis not present

## 2020-01-20 ENCOUNTER — Other Ambulatory Visit: Payer: Self-pay

## 2020-01-20 ENCOUNTER — Encounter (HOSPITAL_COMMUNITY)
Admission: RE | Admit: 2020-01-20 | Discharge: 2020-01-20 | Disposition: A | Discharge status: Home / Self Care | Payer: Medicare Other | Source: Ambulatory Visit | Attending: Internal Medicine | Admitting: Internal Medicine

## 2020-01-20 DIAGNOSIS — Z951 Presence of aortocoronary bypass graft: Secondary | ICD-10-CM

## 2020-01-20 NOTE — Progress Notes (Signed)
FBP:ZWCHENI, No Pcp Per PCP-Cardiologist:Dr. Anne Fu AHF: Dr. Gala Romney  CT Surgery: Dr. Vickey Sages  HPI:  74 y/o male with no significantpast medical [facility] HF>>cardiogenic shock in the setting of severe multivessel CAD.   He had presented to the ER with a several day history of progressive shortness of breath. He was seen in the ER by Dr. Anne Fu and felt to have cardiogenic shock in the setting of probable multivessel coronary artery disease. He developed progressive respiratory distress and was brought emergently to the catheterization lab.Started milrinone 0.25, given IV Lasix andstarted BiPAP.Did not require emergent intubation. EF <20% by echo. Urgentcardiac catheterization showed severe three-vessel disease with 99% left main. Right heart cath showed low output with marked volume overload.Impella was placed in cath lab for mechanical support.Underwent CABG 3/13 (LIMA to LAD; SVG to Diag; SVG sequential to OM1 and OM2) by Dr. Renaldo Fiddler. He was continued on milrinone post operatively and able to wean off w/ stable co-ox. Was placed on GDMT for systolic HF. Able to tolerate low dose losartan, spiro and digoxin, however upward titration was limited by borderline hypotension. He had some post operative atrial fibrillation treated w/ amiodarone however no other significant post operative complications. He progressed well and was discharged home on on 10/01/19. He was evaluated by PT prior to d/c and no home health PT/OT needed.   Recently presented to HF Clinic with Robbie Lis, PA-C on 12/29/19. He reported feeling well. Tolerated losartan increase ok. No hypotension. Enrolled in cardiac rehab and able to  exercise w/o much limitation/exertional dyspnea. BP at cardiac rehab usually in the 110s-120s systolic, per his report. Denied dizziness. No LEE, orthopnea/PND. Daily weights had been stable. Only takes furosemide PRN and has not had to  use recently.   Today he returns to HF clinic for pharmacist medication titration. At last visit with PA-C, losartan was discontinued and Entresto 24/26 mg BID was initiated. Overall he is feeling well today. Only complaint is that the cost of the Sherryll Burger is too high. No dizziness, lightheadedness, chest pain or palpitations. No fatigue. No SOB/DOE. He works outside in the yard, walks and rides a stationery bike to remain active. He has not needed any PRN furosemide. Weights at home have been stable at 153-155 lbs. No LEE, PND or orthopnea. Taking all medications as prescribed and tolerating all medications.   HF Medications: Furosemide 40 mg PRN Entresto 24/26 mg BID Carvedilol 3.125 mg BID Spironolactone 12.5 mg daily Digoxin 0.125 mg daily  Has the patient been experiencing any side effects to the medications prescribed?  No  Does the patient have any problems obtaining medications due to transportation or finances?   No - has [employer] Medicare Part D. Application for [facility] patient assistance is pending.   Understanding of regimen: good Understanding of indications: good Potential of compliance: good Patient understands to avoid NSAIDs. Patient understands to avoid decongestants.    Pertinent Lab Values (01/08/20):  Serum creatinine 0.82, BUN 16, Potassium 4.7, Sodium 137, Digoxin 0.7 ng/mL (12/29/19)  Vital Signs:  Weight: 159 lbs (last clinic weight: 156.4 lbs)  Blood pressure: 144/58 mmHg  Heart rate: 84 bpm  Assessment: 1.Chronic Systolic Heart Failure/ Ischemic CM - New diagnosis. Recent Admit 09/2019 for acute CHF/Cardiogenic Shock 2/2 severe multivessel CAD - 09/18/19: Echo EF 20% RV ok - 09/18/19: Cath 3v CAD with 99% LM. Required Impella support,inotropic therapy + CABG - 09/20/19: Limited Echo post CABG EF 20-25% -NYHAII. Euvolemic on exam.  -  Continue furosemide 40 mg PRN - Increase carvedilol to 6.25 mg BID  - Continue Entresto 24/26 mg BID. Application  for patient assistance pending.  -Continue spironolactone 12.5 mg daily. Did not tolerate 25 mg dose due to hyperkalemia. - Continue digoxin 0.125 mg daily.Last digoxin level 0.7 ng/mL on 12/29/19. - Plan repeat echo in 3 months. Refer to EP for ICD if EF remains < 35%  2.CAD - 09/18/19 cath with3VD w/ LM involvement w/99%  - Underwent CABG 3/13 (LIMA to LAD; SVG to Diag; SVG sequential to OM1 and OM2) - No s/s ischemia  - On ASA, clopidogrel, statin, carvedilol  3. Post-op Afib - Amiodarone discontinued on 10/26/19 by Dr. Vickey Sages.   - not on a/c given short duration    Plan: 1) Medication changes: Based on clinical presentation, vital signs and recent labs will increase carvedilol to 6.25 mg BID. 2) Follow-up: 2 months with Dr. Elyse Jarvis, PharmD, BCPS, [facility], CPP Heart Failure Clinic Pharmacist [phone]

## 2020-01-21 ENCOUNTER — Other Ambulatory Visit: Payer: Self-pay

## 2020-01-21 ENCOUNTER — Other Ambulatory Visit (HOSPITAL_COMMUNITY): Payer: Self-pay | Admitting: *Deleted

## 2020-01-21 MED ORDER — CARVEDILOL 3.125 MG PO TABS: 3.1250 mg | ORAL_TABLET | Freq: Two times a day (BID) | ORAL | 11 refills | Status: DC

## 2020-01-21 NOTE — Telephone Encounter (Signed)
This is CHF pt °

## 2020-01-22 ENCOUNTER — Telehealth (HOSPITAL_COMMUNITY): Payer: Self-pay | Admitting: Pharmacist

## 2020-01-22 ENCOUNTER — Other Ambulatory Visit: Payer: Self-pay

## 2020-01-22 ENCOUNTER — Encounter (HOSPITAL_COMMUNITY)
Admission: RE | Admit: 2020-01-22 | Discharge: 2020-01-22 | Disposition: A | Discharge status: Home / Self Care | Payer: Medicare Other | Source: Ambulatory Visit | Attending: Internal Medicine | Admitting: Internal Medicine

## 2020-01-22 ENCOUNTER — Other Ambulatory Visit (HOSPITAL_COMMUNITY): Payer: Self-pay | Admitting: *Deleted

## 2020-01-22 DIAGNOSIS — Z951 Presence of aortocoronary bypass graft: Secondary | ICD-10-CM

## 2020-01-22 MED ORDER — ENTRESTO 24-26 MG PO TABS: 1.0000 | ORAL_TABLET | Freq: Two times a day (BID) | ORAL | 3 refills | Status: DC

## 2020-01-22 NOTE — Telephone Encounter (Signed)
Entresto prescription sent to [facility] order.

## 2020-01-22 NOTE — Progress Notes (Signed)
Discharge Progress Report  Patient Details  Name: Dennis Roberts MRN: [id number] Date of Birth: 1945/08/17 Referring Provider:     CARDIAC REHAB PHASE II ORIENTATION from 11/24/2019 in [facility] CARDIAC [facility]  Referring Provider Bensimhon, Bevelyn Buckles, MD       Number of Visits: 22 of 24  Reason for Discharge:  Patient reached a stable level of exercise. Patient independent in their exercise. Patient has met program and personal goals.  Smoking History:  Social History   Tobacco Use  Smoking Status Former Smoker  . Quit date: 46  . 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  ADL UCSD:   Initial Exercise Prescription:  Initial Exercise Prescription - 11/24/19 1500      Date of Initial Exercise RX and Referring Provider   Date 11/24/19    Referring Provider Dolores Patty, MD    Expected Discharge Date 01/22/20      Recumbant Bike   Level 2    Watts 20    Minutes 15    METs 2.9      Arm Ergometer   Level 2    RPM 35    Minutes 15      Prescription Details   Frequency (times per week) 3    Duration Progress to 30 minutes of continuous aerobic without signs/symptoms of physical distress      Intensity   THRR 40-80% of Max Heartrate 59-118    Ratings of Perceived Exertion 11-13    Perceived Dyspnea 0-4      Progression   Progression Continue to progress workloads to maintain intensity without signs/symptoms of physical distress.      Resistance Training   Training Prescription Yes    Weight 3lbs    Reps 10-15           Discharge Exercise Prescription (Final Exercise Prescription Changes):  Exercise Prescription Changes - 01/22/20 1600      Response to Exercise   Blood Pressure (Admit) 134/58    Blood Pressure (Exercise) 114/68    Blood Pressure (Exit) 114/64    Heart Rate (Admit) 91 bpm    Heart Rate (Exercise) 105 bpm    Heart Rate (Exit) 90 bpm     Rating of Perceived Exertion (Exercise) 12    Symptoms None    Comments Pt graduated the CRP2 proram today    Duration Progress to 30 minutes of  aerobic without signs/symptoms of physical distress    Intensity THRR unchanged      Progression   Progression Continue to progress workloads to maintain intensity without signs/symptoms of physical distress.    Average METs 5.3      Resistance Training   Training Prescription Yes    Weight 4    Reps 10-15    Time 10 Minutes      Interval Training   Interval Training No      Recumbant Bike   Level 3.5    Minutes 15    METs 5.3           Functional Capacity:  6 Minute Walk    Row Name 11/24/19 1355 01/13/20 1343       6 Minute Walk   Phase Initial Discharge    Distance 1494 feet 1629 feet    Distance % Change -- 9.04 %    Distance Feet Change -- 135 ft    Walk  Time 6 minutes 6 minutes    # of Rest Breaks 0 0    MPH 2.83 3.09    METS 3.07 3.21    RPE 11 11    Perceived Dyspnea  0 0    VO2 Peak 10.76 11.23    Symptoms No No    Resting HR 81 bpm 79 bpm    Resting BP 118/72 108/60    Resting Oxygen Saturation  100 % 99 %    Exercise Oxygen Saturation  during 6 min walk 100 % 99 %    Max Ex. HR 102 bpm 94 bpm    Max Ex. BP 128/70 128/76    2 Minute Post BP 124/70 120/70           Psychological, QOL, Others - Outcomes: PHQ 2/9: Depression screen PHQ 2/9 11/24/2019  Decreased Interest 0  Down, Depressed, Hopeless 0  PHQ - 2 Score 0    Quality of Life:  Quality of Life - 01/18/20 1500      Quality of Life Scores   Health/Function Post 24.5 %    Socioeconomic Post 22 %    Psych/Spiritual Post 22.93 %    Family Post 24 %    GLOBAL Post 23.55 %           Personal Goals: Goals established at orientation with interventions provided to work toward goal.  Personal Goals and Risk Factors at Admission - 11/24/19 1629      Core Components/Risk Factors/Patient Goals on Admission    Weight Management Yes;Weight  Maintenance    Intervention Weight Management: Develop a combined nutrition and exercise program designed to reach desired caloric intake, while maintaining appropriate intake of nutrient and fiber, sodium and fats, and appropriate energy expenditure required for the weight goal.;Weight Management: Provide education and appropriate resources to help participant work on and attain dietary goals.    Expected Outcomes Short Term: Continue to assess and modify interventions until short term weight is achieved;Long Term: Adherence to nutrition and physical activity/exercise program aimed toward attainment of established weight goal;Weight Maintenance: Understanding of the daily nutrition guidelines, which includes 25-35% calories from fat, 7% or less cal from saturated fats, less than '200mg'$  cholesterol, less than 1.5gm of sodium, & 5 or more servings of fruits and vegetables daily;Understanding of distribution of calorie intake throughout the day with the consumption of 4-5 meals/snacks;Understanding recommendations for meals to include 15-35% energy as protein, 25-35% energy from fat, 35-60% energy from carbohydrates, less than '200mg'$  of dietary cholesterol, 20-35 gm of total fiber daily    Heart Failure Yes    Intervention Provide a combined exercise and nutrition program that is supplemented with education, support and counseling about heart failure. Directed toward relieving symptoms such as shortness of breath, decreased exercise tolerance, and extremity edema.    Expected Outcomes Short term: Attendance in program 2-3 days a week with increased exercise capacity. Reported lower sodium intake. Reported increased fruit and vegetable intake. Reports medication compliance.;Short term: Daily weights obtained and reported for increase. Utilizing diuretic protocols set by physician.;Long term: Adoption of self-care skills and reduction of barriers for early signs and symptoms recognition and intervention leading to  self-care maintenance.            Personal Goals Discharge:  Goals and Risk Factor Review    Row Name 12/01/19 0730 12/29/19 1424 02/09/20 0832         Core Components/Risk Factors/Patient Goals Review   Personal Goals Review Weight Management/Obesity;Heart Failure --  Weight Management/Obesity;Heart Failure     Review Dennis Roberts has two identifiable risk factors. His goals for participation in CR are to just "feel better" and to be able to get back to completing his ADLs without difficulty and to build his strength. Dennis Roberts has two identifiable risk factors. His goals for participation in CR are to just "feel better" and to be able to get back to completing his ADLs without difficulty and to build his strength. States he is progressing towards these goals --     Expected Outcomes Dennis Roberts will continue to participate in CR for risk factor modification Dennis Roberts will continue to participate in CR for risk factor modification Dennis Roberts will continue exercise at home by riding his stationary bike and utilizing home weight training. He will utilize learned CAD risk factor modifications techniques at discharge including medication, diet, and medical appointment adherence.            Exercise Goals and Review:  Exercise Goals    Row Name 11/24/19 1336             Exercise Goals   Increase Physical Activity Yes       Intervention Provide advice, education, support and counseling about physical activity/exercise needs.;Develop an individualized exercise prescription for aerobic and resistive training based on initial evaluation findings, risk stratification, comorbidities and participant's personal goals.       Expected Outcomes Short Term: Attend rehab on a regular basis to increase amount of physical activity.;Long Term: Exercising regularly at least 3-5 days a week.;Long Term: Add in home exercise to make exercise part of routine and to increase amount of physical activity.        Increase Strength and Stamina Yes       Intervention Provide advice, education, support and counseling about physical activity/exercise needs.;Develop an individualized exercise prescription for aerobic and resistive training based on initial evaluation findings, risk stratification, comorbidities and participant's personal goals.       Expected Outcomes Short Term: Increase workloads from initial exercise prescription for resistance, speed, and METs.;Short Term: Perform resistance training exercises routinely during rehab and add in resistance training at home;Long Term: Improve cardiorespiratory fitness, muscular endurance and strength as measured by increased METs and functional capacity (6MWT)       Able to understand and use rate of perceived exertion (RPE) scale Yes       Intervention Provide education and explanation on how to use RPE scale       Expected Outcomes Short Term: Able to use RPE daily in rehab to express subjective intensity level;Long Term:  Able to use RPE to guide intensity level when exercising independently       Knowledge and understanding of Target Heart Rate Range (THRR) Yes       Intervention Provide education and explanation of THRR including how the numbers were predicted and where they are located for reference       Expected Outcomes Short Term: Able to state/look up THRR;Long Term: Able to use THRR to govern intensity when exercising independently;Short Term: Able to use daily as guideline for intensity in rehab       Able to check pulse independently Yes       Intervention Provide education and demonstration on how to check pulse in carotid and radial arteries.;Review the importance of being able to check your own pulse for safety during independent exercise       Expected Outcomes Short Term: Able to explain why pulse  checking is important during independent exercise;Long Term: Able to check pulse independently and accurately       Understanding of Exercise  Prescription Yes       Intervention Provide education, explanation, and written materials on patient's individual exercise prescription       Expected Outcomes Short Term: Able to explain program exercise prescription;Long Term: Able to explain home exercise prescription to exercise independently              Exercise Goals Re-Evaluation:  Exercise Goals Re-Evaluation    Row Name 11/30/19 1437 12/09/19 1331 12/23/19 1445         Exercise Goal Re-Evaluation   Exercise Goals Review Increase Physical Activity;Able to understand and use rate of perceived exertion (RPE) scale Increase Physical Activity;Able to understand and use rate of perceived exertion (RPE) scale;Able to check pulse independently;Knowledge and understanding of Target Heart Rate Range (THRR);Increase Strength and Stamina;Understanding of Exercise Prescription Increase Physical Activity;Increase Strength and Stamina;Able to understand and use rate of perceived exertion (RPE) scale;Knowledge and understanding of Target Heart Rate Range (THRR);Able to check pulse independently;Understanding of Exercise Prescription     Comments Patient able to understand and use RPE scale appropriately. Reviewed home exercise guidelines with patient including endpoints, temperature precautions, target heart rate and rate of perceived exertion. Pt is riding his stationary bike or walking 5-6 minutes 4 days/week as his mode of home exercise. Encouraged patient to increase duration, and he is amenable to this. Pt also has weights at home of varying sizes for his resistance training. Pt knows how to manually count his pulse. Pt also states he's active doing yard work without difficulty. Pt voices understanding of instructions given. Pt continues to use his stationary bike for 10-15 minutes and walks 15 minutes 3-4x/week. He also continues with free weights as well as yard work and mowing grass. Re-enforced guidelines for temperature and hydration with  activity.     Expected Outcomes Increase workloads as tolerated to help increase cardiorespiratory fitness. Patient will increase exercise duration from 5-6 minutes to 30 minutes at least 2 days/week in addition to exercise at cardiac rehab. Pt will continue to increase time with bike or walking to goal of 30 minutes 2-3x/week.            Nutrition & Weight - Outcomes:  Pre Biometrics - 11/24/19 1335      Pre Biometrics   Height 5' 4.25" (1.632 m)    Weight 70.5 kg    Waist Circumference 36.5 inches    Hip Circumference 38.75 inches    Waist to Hip Ratio 0.94 %    BMI (Calculated) 26.47    Triceps Skinfold 13 mm    % Body Fat 25.4 %    Grip Strength 29 kg    Flexibility 11 in    Single Leg Stand 4.25 seconds           Post Biometrics - 01/15/20 1625       Post  Biometrics   Height 5' 4.25" (1.632 m)    Weight 71.7 kg    Waist Circumference 36.5 inches    Hip Circumference 39 inches    Waist to Hip Ratio 0.94 %    BMI (Calculated) 26.92    Triceps Skinfold 10 mm    % Body Fat 24.6 %    Grip Strength 32 kg    Flexibility 11.5 in    Single Leg Stand 15.25 seconds  Nutrition:  Nutrition Therapy & Goals - 12/09/19 1430      Nutrition Therapy   Diet Heart Healthy      Personal Nutrition Goals   Nutrition Goal Pt to build a healthy plate including vegetables, fruits, whole grains, and low-fat dairy products in a heart healthy meal plan.      Intervention Plan   Intervention Nutrition handout(s) given to patient.;Prescribe, educate and counsel regarding individualized specific dietary modifications aiming towards targeted core components such as weight, hypertension, lipid management, diabetes, heart failure and other comorbidities.    Expected Outcomes Short Term Goal: Understand basic principles of dietary content, such as calories, fat, sodium, cholesterol and nutrients.           Nutrition Discharge:  Nutrition Assessments - 02/04/20 0818       MEDFICTS Scores   Post Score 51           Education Questionnaire Score:  Knowledge Questionnaire Score - 01/18/20 1500      Knowledge Questionnaire Score   Post Score 23/24           Goals reviewed with patient; copy given to patient.

## 2020-02-01 ENCOUNTER — Other Ambulatory Visit: Payer: Self-pay

## 2020-02-01 ENCOUNTER — Ambulatory Visit (HOSPITAL_COMMUNITY)
Admission: RE | Admit: 2020-02-01 | Discharge: 2020-02-01 | Disposition: A | Discharge status: Home / Self Care | Payer: Medicare Other | Source: Ambulatory Visit | Attending: Cardiology | Admitting: Cardiology

## 2020-02-01 VITALS — BP 114/58 | HR 84 | Wt 159.0 lb

## 2020-02-01 DIAGNOSIS — Z951 Presence of aortocoronary bypass graft: Secondary | ICD-10-CM | POA: Diagnosis not present

## 2020-02-01 DIAGNOSIS — I5022 Chronic systolic (congestive) heart failure: Secondary | ICD-10-CM | POA: Diagnosis not present

## 2020-02-01 DIAGNOSIS — I255 Ischemic cardiomyopathy: Secondary | ICD-10-CM | POA: Insufficient documentation

## 2020-02-01 DIAGNOSIS — I2510 Atherosclerotic heart disease of native coronary artery without angina pectoris: Secondary | ICD-10-CM | POA: Diagnosis not present

## 2020-02-01 DIAGNOSIS — I4891 Unspecified atrial fibrillation: Secondary | ICD-10-CM | POA: Diagnosis not present

## 2020-02-01 MED ORDER — CARVEDILOL 6.25 MG PO TABS: 6.2500 mg | ORAL_TABLET | Freq: Two times a day (BID) | ORAL | 3 refills | Status: DC

## 2020-02-01 NOTE — Patient Instructions (Signed)
It was a pleasure seeing you today!  MEDICATIONS: -We are changing your medications today -Increase carvedilol to 6.25 mg (1 tablet) twice daily. You may take 2 tablets of the 3.125 mg strength twice daily until you receive the new dose.  -Call if you have questions about your medications.   NEXT APPOINTMENT: Return to clinic in 2 month with Dr. Gala Romney.  In general, to take care of your heart failure: -Limit your fluid intake to 2 Liters (half-gallon) per day.   -Limit your salt intake to ideally 2-3 grams (2000-3000 mg) per day. -Weigh yourself daily and record, and bring that "weight diary" to your next appointment.  (Weight gain of 2-3 pounds in 1 day typically means fluid weight.) -The medications for your heart are to help your heart and help you live longer.   -Please contact us before stopping any of your heart medications.  Call the clinic at [phone] with questions or to reschedule future appointments.

## 2020-02-02 ENCOUNTER — Telehealth (HOSPITAL_COMMUNITY): Payer: Self-pay | Admitting: Pharmacist

## 2020-02-02 NOTE — Telephone Encounter (Signed)
Sent in Manufacturer's Assistance application to [employer] for Entresto.    Application pending, will continue to follow.  Loyda Costin, PharmD, BCPS, BCCP, CPP Heart Failure Clinic Pharmacist [phone]  

## 2020-02-04 NOTE — Telephone Encounter (Signed)
Advanced Heart Failure Patient Advocate Encounter   Patient was approved to receive Entresto from [employer].  Patient ID: [id number] Effective dates: 02/04/20 through 07/08/20  Karle Plumber, PharmD, BCPS, BCCP, CPP Heart Failure Clinic Pharmacist [phone]

## 2020-02-11 ENCOUNTER — Other Ambulatory Visit (HOSPITAL_COMMUNITY): Payer: Self-pay | Admitting: Surgery

## 2020-02-11 MED ORDER — DIGOXIN 125 MCG PO TABS: 0.1250 mg | ORAL_TABLET | Freq: Every day | ORAL | 3 refills | Status: DC

## 2020-02-19 ENCOUNTER — Telehealth (HOSPITAL_COMMUNITY): Payer: Self-pay | Admitting: Pharmacist

## 2020-02-19 NOTE — Telephone Encounter (Signed)
Received message that patient has experienced dizziness and lightheadedness since his carvedilol was increased. Additionally, he now has a sensitivity to light and notes halos surrounding bright lights. I will have him come in to the clinic on Monday to have his digoxin level checked as this could be causing the vision related side effects. Additionally, will have him take carvedilol 3.125 mg BID until the digoxin level can be checked on Monday. Further changes will be made at that time as needed. Patient expressed understanding.    Karle Plumber, PharmD, BCPS, BCCP, CPP Heart Failure Clinic Pharmacist [phone]

## 2020-02-22 ENCOUNTER — Ambulatory Visit (HOSPITAL_COMMUNITY)
Admission: RE | Admit: 2020-02-22 | Discharge: 2020-02-22 | Disposition: A | Discharge status: Home / Self Care | Payer: Medicare Other | Source: Ambulatory Visit | Attending: Internal Medicine | Admitting: Internal Medicine

## 2020-02-22 ENCOUNTER — Other Ambulatory Visit: Payer: Self-pay

## 2020-02-22 DIAGNOSIS — I255 Ischemic cardiomyopathy: Secondary | ICD-10-CM | POA: Insufficient documentation

## 2020-02-22 DIAGNOSIS — I420 Dilated cardiomyopathy: Secondary | ICD-10-CM | POA: Insufficient documentation

## 2020-02-22 DIAGNOSIS — Z79899 Other long term (current) drug therapy: Secondary | ICD-10-CM | POA: Diagnosis not present

## 2020-02-22 DIAGNOSIS — Z5181 Encounter for therapeutic drug level monitoring: Secondary | ICD-10-CM | POA: Insufficient documentation

## 2020-02-22 LAB — BASIC METABOLIC PANEL
Anion gap: 7 (ref 5–15)
BUN: 13 mg/dL (ref 8–23)
CO2: 25 mmol/L (ref 22–32)
Calcium: 9.2 mg/dL (ref 8.9–10.3)
Chloride: 107 mmol/L (ref 98–111)
Creatinine, Ser: 0.86 mg/dL (ref 0.61–1.24)
GFR calc Af Amer: >60 mL/min (ref >60)
GFR calc non Af Amer: >60 mL/min (ref >60)
Glucose, Bld: 96 mg/dL (ref 70–99)
Potassium: 4.4 mmol/L (ref 3.5–5.1)
Sodium: 139 mmol/L (ref 135–145)

## 2020-02-22 LAB — DIGOXIN LEVEL: Digoxin Level: 0.4 ng/mL — ABNORMAL LOW (ref 0.8–2.0)

## 2020-02-22 MED ORDER — CARVEDILOL 3.125 MG PO TABS: 3.1250 mg | ORAL_TABLET | Freq: Two times a day (BID) | ORAL | 3 refills | Status: DC

## 2020-02-22 NOTE — Telephone Encounter (Signed)
BMET drawn today was WNL and digoxin level normal at 0.4 ng/mL (true trough). Patient notes he starts feeling dizzy and sees bright lights when his SBP <90. He feels better on the carvedilol 3.125 mg BID dose. I will have him continue that dose and come to pharmacy clinic in 2 weeks to assess symptoms. Further adjustments to be made at that time. Medication list updated. Patient expressed understanding.   Karle Plumber, PharmD, BCPS, BCCP, CPP Heart Failure Clinic Pharmacist [phone]

## 2020-03-08 ENCOUNTER — Encounter (HOSPITAL_COMMUNITY): Payer: Self-pay

## 2020-03-08 ENCOUNTER — Other Ambulatory Visit: Payer: Self-pay

## 2020-03-08 ENCOUNTER — Ambulatory Visit (HOSPITAL_COMMUNITY)
Admission: RE | Admit: 2020-03-08 | Discharge: 2020-03-08 | Disposition: A | Discharge status: Home / Self Care | Payer: Medicare Other | Source: Ambulatory Visit | Attending: Internal Medicine | Admitting: Internal Medicine

## 2020-03-08 VITALS — BP 111/64 | HR 79 | Wt 160.4 lb

## 2020-03-08 DIAGNOSIS — Z951 Presence of aortocoronary bypass graft: Secondary | ICD-10-CM | POA: Insufficient documentation

## 2020-03-08 DIAGNOSIS — I5022 Chronic systolic (congestive) heart failure: Secondary | ICD-10-CM | POA: Diagnosis not present

## 2020-03-08 DIAGNOSIS — Z7901 Long term (current) use of anticoagulants: Secondary | ICD-10-CM | POA: Diagnosis not present

## 2020-03-08 DIAGNOSIS — Z79899 Other long term (current) drug therapy: Secondary | ICD-10-CM | POA: Diagnosis not present

## 2020-03-08 DIAGNOSIS — I255 Ischemic cardiomyopathy: Secondary | ICD-10-CM | POA: Insufficient documentation

## 2020-03-08 DIAGNOSIS — I2510 Atherosclerotic heart disease of native coronary artery without angina pectoris: Secondary | ICD-10-CM | POA: Diagnosis not present

## 2020-03-08 DIAGNOSIS — I4891 Unspecified atrial fibrillation: Secondary | ICD-10-CM | POA: Diagnosis not present

## 2020-03-08 NOTE — Progress Notes (Signed)
BZJ:IRCVELF, No Pcp Per PCP-Cardiologist:Dr. Anne Fu AHF: Dr. Gala Romney  CT Surgery: Dr. Vickey Sages  HPI:  74 y/o male with no significantpast [facility] HF>>cardiogenic shock in the setting of severe multivessel CAD.   He had presented to the ER with a several day history of progressive shortness of breath. He was seen in the ER by Dr. Anne Fu and felt to have cardiogenic shock in the setting of probable multivessel coronary artery disease. He developed progressive respiratory distress and was brought emergently to the catheterization lab.Started milrinone 0.25, given IV Lasix andstarted BiPAP.Did not require emergent intubation. EF <20% by echo. Urgentcardiac catheterization showed severe three-vessel disease with 99% left main. Right heart cath showed low output with marked volume overload.Impella was placed in cath lab for mechanical support.Underwent CABG 09/19/19 (LIMA to LAD; SVG to Diag; SVG sequential to OM1 and OM2) by Dr. Renaldo Fiddler. He was continued on milrinone post operatively and able to wean off w/ stable co-ox. Was placed on GDMT for systolic HF. Able to tolerate low dose losartan, spiro and digoxin, however upward titration was limited by borderline hypotension. He had some post operative atrial fibrillation treated w/ amiodarone however no other significant post operative complications. He progressed well and was discharged home on on 10/01/19. He was evaluated by PT prior to d/c and no home health PT/OT needed.  Recently presented to HF Clinic with Robbie Lis, PA-C on 12/29/19. Hereported feeling well. Tolerated losartan increase ok. No hypotension. Enrolled in cardiac rehab and able to  exercise w/o much limitation/exertional dyspnea. BP at cardiac rehab usually in the 110s-120s systolic, per his report. Denied dizziness. No LEE, orthopnea/PND. Daily weights had been stable. Only takes furosemide PRN and has not  had to use recently.   Returned to HF clinic for pharmacist medication titration on 02/01/20. At last visit with PA-C, losartan was discontinued and Entresto 24/26 mg BID was initiated. Overall he is feeling well today. Only complaint is that the cost of the Sherryll Burger is too high. No dizziness, lightheadedness, chest pain or palpitations. No fatigue. No SOB/DOE. He works outside in the yard, walks and rides a stationery bike to remain active. He has not needed any PRN furosemide. Weights at home have been stable at 153-155 lbs. No LEE, PND or orthopnea. Taking all medications as prescribed and tolerating all medications.   Today he returns to HF clinic for pharmacist medication titration. At last visit with pharmacist, carvedilol was increased to 6.25 mg BID. Of note, the patient called 3 weeks later to report dizziness and light sensitivity (halos around bright lights associated with SBP<90) after carvedilol increase. Instructed to decrease carvedilol back to 3.125 mg BID and return for labs. BMET was WNL and digoxin level was normal at 0.4 ng/mL (true trough). However, he endorsed feeling much better on lower carvedilol dose. Today in clinic he feels much better. Reports having only one episode of dizziness and light sensitivity while sitting on the couch watching TV last Saturday. During the episode, BP was low (86/54) but after dizziness subsided, BP returned to normal ~110/82. Denies lightheadedness, fatigue, chest pain or palpitations. Continues to be very active - no SOB/DOE. Weights at home have been stable, ranging from 153-154 lbs on home scale. Reported never needing PRN furosemide dose. No LEE on exam. Denies PND/orthopnea. Adherent to low salt diet. Denies problems obtaining or affording medications and reported excellent medication adherence.  HF Medications: Carvedilol 3.125 mg BID Entresto 24-26 mg BID Spironolactone 12.5  mg daily Digoxin 0.125 mg daily Furosemide 40 mg PRN  Has the  patient been experiencing any side effects to the medications prescribed?  No  Does the patient have any problems obtaining medications due to transportation or finances?   No- has [employer] Medicare Part D. Approved for [facility] patient assistance through [employer].  Understanding of regimen: good Understanding of indications: good Potential of compliance: excellent Patient understands to avoid NSAIDs. Patient understands to avoid decongestants.    Pertinent Lab Values: 02/22/20 . Serum creatinine 0.86, BUN 13, Potassium 4.4, Sodium 139, BNP 147 pg/mL (09/29/19), Magnesium 1.9 (09/24/19), Digoxin 0.4 ng/mL   Vital Signs: . Weight: 160 lbs (last clinic weight: 159 lbs) . Blood pressure: 111/64 mmHg . 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 Recent Admit 09/2019 for acute CHF/Cardiogenic Shock 2/2 severe multivessel CAD - 09/18/19: Echo EF 20% RV ok - 09/18/19: Cath 3v CAD with 99% LM. Required Impella support,inotropic therapy + CABG - 09/20/19: Limited Echo post CABG EF 20-25% -NYHAII. Euvolemic on exam. - Continuefurosemide 40 mg PRN - Continue carvedilol 3.125 mg BID - Continue Entresto 24-26 mg BID -Continue spironolactone 12.5 mg daily. Did not tolerate 25 mg dose due to hyperkalemia. - Continue digoxin 0.125 mg daily.Last digoxin level 0.4 ng/mL on 02/22/20 - Plan repeat echo in 3 months. Refer to EP for ICD if EF remains < 35% - No medication changes made due to prior low BP, dizziness and light sensitivity. Although light sensitivity has improved, could consider discontinuing digoxin in the future as there are cases of digoxin toxicity at therapeutic levels. For now, will continue his current regimen.    2.CAD - 09/18/19 cath with3VD w/ LM involvement w/99%  - Underwent CABG 09/19/19 (LIMA to LAD; SVG to Diag; SVG sequential to OM1 and OM2) - No signs or symptoms of ischemia  - On ASA,clopidogrel,statin,  carvedilol  3. Post-op Afib -Amiodarone discontinued on 10/26/19 by Dr. Vickey Sages. - Not on a/c given short duration   Plan: 1) Medication changes: Based on clinical presentation, vital signs and recent labs will not make any medication changes due previous low BP, dizziness and light sensitivity. 2) Follow-up on 04/13/20 for ECHO and visit with Dr. Gala Romney.   Karle Plumber, PharmD, BCPS, BCCP, CPP Heart Failure Clinic Pharmacist [phone]

## 2020-03-08 NOTE — Patient Instructions (Signed)
It was a pleasure seeing you today!  MEDICATIONS: -No medication changes today -Call if you have questions about your medications.   NEXT APPOINTMENT: Return to clinic in 1 month with Dr. Bensimhon.  In general, to take care of your heart failure: -Limit your fluid intake to 2 Liters (half-gallon) per day.   -Limit your salt intake to ideally 2-3 grams (2000-3000 mg) per day. -Weigh yourself daily and record, and bring that "weight diary" to your next appointment.  (Weight gain of 2-3 pounds in 1 day typically means fluid weight.) -The medications for your heart are to help your heart and help you live longer.   -Please contact us before stopping any of your heart medications.  Call the clinic at [phone] with questions or to reschedule future appointments.  

## 2020-03-30 ENCOUNTER — Encounter (HOSPITAL_COMMUNITY): Payer: Medicare Other | Admitting: Internal Medicine

## 2020-03-30 ENCOUNTER — Other Ambulatory Visit (HOSPITAL_COMMUNITY): Payer: Medicare Other

## 2020-04-04 ENCOUNTER — Other Ambulatory Visit (HOSPITAL_COMMUNITY): Payer: Self-pay

## 2020-04-04 MED ORDER — SPIRONOLACTONE 25 MG PO TABS: 12.5000 mg | ORAL_TABLET | Freq: Every day | ORAL | 1 refills | Status: DC

## 2020-04-12 ENCOUNTER — Encounter (HOSPITAL_COMMUNITY): Payer: Medicare Other | Admitting: Internal Medicine

## 2020-04-12 ENCOUNTER — Other Ambulatory Visit (HOSPITAL_COMMUNITY): Payer: Medicare Other

## 2020-04-12 NOTE — Progress Notes (Signed)
Advanced Heart Failure Clinic Note   Referring Physician: PCP: Patient, No Pcp Per PCP-Cardiologist: Dr. Anne Fu AHF: Dr. Gala Romney  CT Surgery: Dr. 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   He had presented to the ER in 3/21 new onset HF/ He developed progressive respiratory distress and was brought emergently to the catheterization lab. EF <20% by echo. Urgent cardiac catheterization showed severe three-vessel disease with 99% left main.  Right heart cath showed low output with marked volume overload.  Impella was placed in cath lab for mechanical support.  Underwent CABG 3/13 (LIMA to LAD; SVG to Diag; SVG sequential to OM1 and OM2) by Dr. Renaldo Fiddler. He was continued on milrinone post operatively and able to wean off w/ stable co-ox. Had PAF but converted with amio.    Here for routine f/u. Doing great. Remains active around the house and the yard. Riding his stationary bike 5x per week for about 15 mins. No CP or SOB. No orthopnea or PND. Was not able to tolerate carvedilol increase to 6.25 bid due to low BPs. SBP at home 115-120.   Echo today 04/13/20 EF 45-50% Personally reviewed    Past Medical History:  Diagnosis Date  . CHF (congestive heart failure) (HCC)   . Coronary artery disease     Current Outpatient Medications  Medication Sig Dispense Refill  . aspirin EC 81 MG tablet Take 1 tablet (81 mg total) by mouth daily. 150 tablet 2  . carvedilol (COREG) 3.125 MG tablet Take 1 tablet (3.125 mg total) by mouth 2 (two) times daily with a meal. 180 tablet 3  . clopidogrel (PLAVIX) 75 MG tablet Take 1 tablet (75 mg total) by mouth daily. 30 tablet 11  . digoxin (LANOXIN) 0.125 MG tablet Take 1 tablet (0.125 mg total) by mouth daily. 90 tablet 3  . lactobacillus acidophilus (BACID) TABS tablet Take 1 tablet by mouth daily.     . Multiple Vitamin  (MULTIVITAMIN) capsule Take 1 capsule by mouth daily.    Marland Kitchen OVER THE COUNTER MEDICATION Apply 1 application topically daily as needed (pain). CBD cream    . sacubitril-valsartan (ENTRESTO) 24-26 MG Take 1 tablet by mouth 2 (two) times daily. 180 tablet 3  . spironolactone (ALDACTONE) 25 MG tablet Take 0.5 tablets (12.5 mg total) by mouth daily. 15 tablet 1  . furosemide (LASIX) 40 MG tablet Take 1 tablet (40 mg total) by mouth daily as needed (for weight gain of 2 pounds or more in 24 hours). (Patient not taking: Reported on 03/08/2020) 15 tablet 1  . potassium chloride SA (KLOR-CON) 20 MEQ tablet Take 1 tablet (20 mEq total) by mouth as needed. With every dose of lasix (Patient not taking: Reported on 03/08/2020) 90 tablet 3   No current facility-administered medications for this encounter.    Allergies  Allergen Reactions  . Penicillins   . Sulfa Antibiotics       Social History   Socioeconomic History  . Marital status: Divorced    Spouse name: Not on file  . Number of children: Not on file  . Years of education: 27  . Highest education level: Some college, no degree  Occupational History  . Not on file  Tobacco Use  . Smoking status: Former Smoker    Quit date: 1974    Years since quitting:  47.7  . Smokeless tobacco: Former Engineer, water and Sexual Activity  . Alcohol use: Not Currently    Comment: occ  . Drug use: Never  . Sexual activity: Not on file  Other Topics Concern  . Not on file  Social History Narrative  . Not on file   Social Determinants of Health   Financial Resource Strain:   . Difficulty of Paying Living Expenses: Not on file  Food Insecurity:   . Worried About Programme researcher, broadcasting/film/video in the Last Year: Not on file  . Ran Out of Food in the Last Year: Not on file  Transportation Needs:   . Lack of Transportation (Medical): Not on file  . Lack of Transportation (Non-Medical): Not on file  Physical Activity:   . Days of Exercise per Week: Not on file    . Minutes of Exercise per Session: Not on file  Stress:   . Feeling of Stress : Not on file  Social Connections:   . Frequency of Communication with Friends and Family: Not on file  . Frequency of Social Gatherings with Friends and Family: Not on file  . Attends Religious Services: Not on file  . Active Member of Clubs or Organizations: Not on file  . Attends Banker Meetings: Not on file  . Marital Status: Not on file  Intimate Partner Violence:   . Fear of Current or Ex-Partner: Not on file  . Emotionally Abused: Not on file  . Physically Abused: Not on file  . Sexually Abused: Not on file      Family History  Problem Relation Age of Onset  . Healthy Mother   . Cancer Father   . Heart disease Father        bypass at 28    Vitals:   04/13/20 1402  BP: 132/70  Pulse: 79  SpO2: 98%  Weight: 72.2 kg (159 lb 3.2 oz)  Height: 5\' 5"  (1.651 m)     PHYSICAL EXAM: General:  Well appearing. No resp difficulty HEENT: normal Neck: supple. no JVD. Carotids 2+ bilat; no bruits. No lymphadenopathy or thryomegaly appreciated. Cor: PMI nondisplaced. Regular rate & rhythm. No rubs, gallops or murmurs. Lungs: clear Abdomen: soft, nontender, nondistended. No hepatosplenomegaly. No bruits or masses. Good bowel sounds. Extremities: no cyanosis, clubbing, rash, edema Neuro: alert & orientedx3, cranial nerves grossly intact. moves all 4 extremities w/o difficulty. Affect pleasant    ASSESSMENT & PLAN:  1. 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 Recent Admit 09/2019 for acute CHF/Cardiogenic Shock 2/2 severe multivessel CAD  - 09/18/19: Echo EF 20% RV ok - 09/18/19: Cath 3v CAD with 99% LM. Required Impella support, inotropic therapy + CABG - 09/20/19: Limited Echo post CABG EF 20-25%  - Echo today 04/13/20 EF 45-50% Personally reviewed - Doing great. NYHA I-II - Volume status looks good. Does not use lasix  - Stop digoxin - Continue spiro 25 daily   - Continue losartan 12.5 daily for now. BP to soft for further titration/Entresto  - Will not titrate meds further or add SGLT2i given LV recovery and hypotension with increasing b-blocker recently  - EF > 35% no need for ICD  2. CAD - 09/18/19 cath with 3VD w/ LM involvement w/ 99%  - Underwent CABG 3/13 (LIMA to LAD; SVG to Diag; SVG sequential to OM1 and OM2) - No s/s ischemia - On DAPT, statin  3. Post-op Afib -off amio. No recurrence  EF nearly normal. NYHA  I. Can graduate HF Clinic and f/u with Dr. Anne Fu.    Arvilla Meres, MD 04/13/20

## 2020-04-13 ENCOUNTER — Encounter (HOSPITAL_COMMUNITY): Payer: Self-pay | Admitting: Internal Medicine

## 2020-04-13 ENCOUNTER — Ambulatory Visit (HOSPITAL_COMMUNITY)
Admission: RE | Admit: 2020-04-13 | Discharge: 2020-04-13 | Disposition: A | Discharge status: Home / Self Care | Payer: Medicare Other | Source: Ambulatory Visit | Attending: Internal Medicine | Admitting: Internal Medicine

## 2020-04-13 ENCOUNTER — Other Ambulatory Visit: Payer: Self-pay

## 2020-04-13 ENCOUNTER — Ambulatory Visit (HOSPITAL_BASED_OUTPATIENT_CLINIC_OR_DEPARTMENT_OTHER)
Admission: RE | Admit: 2020-04-13 | Discharge: 2020-04-13 | Disposition: A | Discharge status: Home / Self Care | Payer: Medicare Other | Source: Ambulatory Visit | Attending: Internal Medicine | Admitting: Internal Medicine

## 2020-04-13 VITALS — BP 132/70 | HR 79 | Ht 65.0 in | Wt 159.2 lb

## 2020-04-13 DIAGNOSIS — Z880 Allergy status to penicillin: Secondary | ICD-10-CM | POA: Diagnosis not present

## 2020-04-13 DIAGNOSIS — Z882 Allergy status to sulfonamides status: Secondary | ICD-10-CM | POA: Diagnosis not present

## 2020-04-13 DIAGNOSIS — I509 Heart failure, unspecified: Secondary | ICD-10-CM

## 2020-04-13 DIAGNOSIS — I5022 Chronic systolic (congestive) heart failure: Secondary | ICD-10-CM | POA: Diagnosis not present

## 2020-04-13 DIAGNOSIS — I2510 Atherosclerotic heart disease of native coronary artery without angina pectoris: Secondary | ICD-10-CM

## 2020-04-13 DIAGNOSIS — I255 Ischemic cardiomyopathy: Secondary | ICD-10-CM | POA: Insufficient documentation

## 2020-04-13 DIAGNOSIS — Z7982 Long term (current) use of aspirin: Secondary | ICD-10-CM | POA: Insufficient documentation

## 2020-04-13 DIAGNOSIS — Z79899 Other long term (current) drug therapy: Secondary | ICD-10-CM | POA: Diagnosis not present

## 2020-04-13 DIAGNOSIS — I480 Paroxysmal atrial fibrillation: Secondary | ICD-10-CM

## 2020-04-13 DIAGNOSIS — Z87891 Personal history of nicotine dependence: Secondary | ICD-10-CM | POA: Insufficient documentation

## 2020-04-13 DIAGNOSIS — Z951 Presence of aortocoronary bypass graft: Secondary | ICD-10-CM

## 2020-04-13 DIAGNOSIS — Z8249 Family history of ischemic heart disease and other diseases of the circulatory system: Secondary | ICD-10-CM | POA: Insufficient documentation

## 2020-04-13 LAB — BASIC METABOLIC PANEL
Anion gap: 10 (ref 5–15)
BUN: 17 mg/dL (ref 8–23)
CO2: 24 mmol/L (ref 22–32)
Calcium: 9.2 mg/dL (ref 8.9–10.3)
Chloride: 105 mmol/L (ref 98–111)
Creatinine, Ser: 0.74 mg/dL (ref 0.61–1.24)
GFR calc non Af Amer: >60 mL/min (ref >60)
Glucose, Bld: 101 mg/dL — ABNORMAL HIGH (ref 70–99)
Potassium: 4.4 mmol/L (ref 3.5–5.1)
Sodium: 139 mmol/L (ref 135–145)

## 2020-04-13 LAB — ECHOCARDIOGRAM COMPLETE
Area-P 1/2: 4.86 cm2
Calc EF: 43.2 %
S' Lateral: 4 cm
Single Plane A2C EF: 43.4 %
Single Plane A4C EF: 41.7 %

## 2020-04-13 NOTE — Patient Instructions (Addendum)
Congratulations! You graduated Advanced Heart Failure Clinic! You do not have to follow up with [place] unless you need to!  STOP Digoxin  Labs done today, your results will be available in MyChart, we will contact you for abnormal readings.  Your physician recommends that you follow up with Dr. Anne Fu in about 3-4 months. They will contact you to schedule   If you have any questions or concerns before your next appointment please send [place] a message through [place] or call our office at [phone].    TO LEAVE A MESSAGE FOR THE NURSE SELECT OPTION 2, PLEASE LEAVE A MESSAGE INCLUDING: . YOUR NAME . DATE OF BIRTH . CALL BACK NUMBER . REASON FOR CALL**this is important as we prioritize the call backs  YOU WILL RECEIVE A CALL BACK THE SAME DAY AS LONG AS YOU CALL BEFORE 4:00 PM

## 2020-04-13 NOTE — Addendum Note (Signed)
Encounter addended by: Samara Snide, RN on: 04/13/2020 2:57 PM  Actions taken: Medication long-term status modified, Charge Capture section accepted, Order list changed, Diagnosis association updated, Clinical Note Signed

## 2020-04-13 NOTE — Progress Notes (Signed)
  Echocardiogram 2D Echocardiogram has been performed.  Dennis Roberts 04/13/2020, 1:31 PM

## 2020-04-16 ENCOUNTER — Other Ambulatory Visit (HOSPITAL_COMMUNITY): Payer: Self-pay | Admitting: Internal Medicine

## 2020-04-20 ENCOUNTER — Ambulatory Visit: Payer: Medicare Other | Admitting: Cardiology

## 2020-04-20 ENCOUNTER — Other Ambulatory Visit: Payer: Self-pay

## 2020-04-20 ENCOUNTER — Encounter: Payer: Self-pay | Admitting: Cardiology

## 2020-04-20 VITALS — BP 110/60 | HR 90 | Ht 65.0 in | Wt 161.8 lb

## 2020-04-20 DIAGNOSIS — Z951 Presence of aortocoronary bypass graft: Secondary | ICD-10-CM

## 2020-04-20 DIAGNOSIS — I2510 Atherosclerotic heart disease of native coronary artery without angina pectoris: Secondary | ICD-10-CM | POA: Diagnosis not present

## 2020-04-20 DIAGNOSIS — I5022 Chronic systolic (congestive) heart failure: Secondary | ICD-10-CM

## 2020-04-20 MED ORDER — SPIRONOLACTONE 25 MG PO TABS: 12.5000 mg | ORAL_TABLET | Freq: Every day | ORAL | 3 refills | Status: DC

## 2020-04-20 NOTE — Patient Instructions (Signed)
Medication Instructions:  Your physician recommends that you continue on your current medications as directed. Please refer to the Current Medication list given to you today.  *If you need a refill on your cardiac medications before your next appointment, please call your pharmacy*  Lab Work: If you have labs (blood work) drawn today and your tests are completely normal, you will receive your results only by: Marland Kitchen MyChart Message (if you have MyChart) OR . A paper copy in the mail If you have any lab test that is abnormal or we need to change your treatment, we will call you to review the results.  Testing/Procedures: None ordered today.   Follow-Up: At [facility], you and your health needs are our priority.  As part of our continuing mission to provide you with exceptional heart care, we have created designated Provider Care Teams.  These Care Teams include your primary Cardiologist (physician) and Advanced Practice Providers (APPs -  Physician Assistants and Nurse Practitioners) who all work together to provide you with the care you need, when you need it.  We recommend signing up for the patient portal called "MyChart".  Sign up information is provided on this After Visit Summary.  MyChart is used to connect with patients for Virtual Visits (Telemedicine).  Patients are able to view lab/test results, encounter notes, upcoming appointments, etc.  Non-urgent messages can be sent to your provider as well.   To learn more about what you can do with MyChart, go to [email or web address].    Your next appointment:   4 month(s)  The format for your next appointment:   In Person  Provider:   You may see Dr. Anne Fu

## 2020-04-20 NOTE — Progress Notes (Signed)
Cardiology Office Note:    Date:  04/20/2020   ID:  Dennis Roberts, DOB [birth year], MRN [id number]  PCP:  Patient, No Pcp Per  [facility] Cardiologist:  No primary care provider on file.  [facility] Electrophysiologist:  None   Referring MD: Dolores Patty, MD   No chief complaint on file.   History of Present Illness:    Dennis Roberts is a 74 y.o. male with coronary disease CABG chronic systolic heart failure ischemic cardiomyopathy prior EF less than 20% who underwent bypass surgery LIMA to LAD SVG to diagonal SVG to OM1 and OM 2 by Dr. Vickey Sages here for follow-up.  Brief PAF with amnio.  Doing very well.  Blood pressure excellent.  Any evidence of chest pain no shortness of breath.  NYHA class I.  Saw Dr. Gala Romney previously.  Off of digoxin.  Past Medical History:  Diagnosis Date   CHF (congestive heart failure) (HCC)    Coronary artery disease     Past Surgical History:  Procedure Laterality Date   ARTERIAL LINE INSERTION N/A 09/18/2019   Procedure: ARTERIAL LINE INSERTION;  Surgeon: Dolores Patty, MD;  Location: [facility] INVASIVE CV LAB;  Service: Cardiovascular;  Laterality: N/A;   CARDIAC CATHETERIZATION     CORONARY ARTERY BYPASS GRAFT N/A 09/19/2019   Procedure: CORONARY ARTERY BYPASS GRAFTING (CABG) using LIMA to LAD; Endoscopic harvest right saphenous vein: SVG tp Diag; SVG sequential to OM1 and OM2.;  Surgeon: Linden Dolin, MD;  Location: [facility] OR;  Service: Open Heart Surgery;  Laterality: N/A;   ENDOVEIN HARVEST OF GREATER SAPHENOUS VEIN Right 09/19/2019   Procedure: Mack Guise Of Greater Saphenous Vein;  Surgeon: Linden Dolin, MD;  Location: [facility] OR;  Service: Open Heart Surgery;  Laterality: Right;   IR THORACENTESIS ASP PLEURAL SPACE W/IMG GUIDE  10/16/2019   NOSE SURGERY     PATENT DUCTUS ARTERIOUS REPAIR N/A 09/19/2019   Procedure: CLOSURE OF PATENT DUCTUS ARTERIOSUS;  Surgeon: Linden Dolin, MD;  Location: [facility] OR;  Service: Open  Heart Surgery;  Laterality: N/A;   RIGHT/LEFT HEART CATH AND CORONARY ANGIOGRAPHY N/A 09/18/2019   Procedure: RIGHT/LEFT HEART CATH AND CORONARY ANGIOGRAPHY;  Surgeon: Dolores Patty, MD;  Location: [facility] INVASIVE CV LAB;  Service: Cardiovascular;  Laterality: N/A;   TEE WITHOUT CARDIOVERSION N/A 09/19/2019   Procedure: TRANSESOPHAGEAL ECHOCARDIOGRAM (TEE);  Surgeon: Linden Dolin, MD;  Location: [facility];  Service: Open Heart Surgery;  Laterality: N/A;   VENTRICULAR ASSIST DEVICE INSERTION N/A 09/18/2019   Procedure: VENTRICULAR ASSIST DEVICE INSERTION;  Surgeon: Dolores Patty, MD;  Location: [facility] INVASIVE CV LAB;  Service: Cardiovascular;  Laterality: N/A;    Current Medications: Current Meds  Medication Sig   aspirin EC 81 MG tablet Take 1 tablet (81 mg total) by mouth daily.   carvedilol (COREG) 3.125 MG tablet Take 1 tablet (3.125 mg total) by mouth 2 (two) times daily with a meal.   clopidogrel (PLAVIX) 75 MG tablet Take 1 tablet (75 mg total) by mouth daily.   furosemide (LASIX) 40 MG tablet Take 1 tablet (40 mg total) by mouth daily as needed (for weight gain of 2 pounds or more in 24 hours).   lactobacillus acidophilus (BACID) TABS tablet Take 1 tablet by mouth daily.    Multiple Vitamin (MULTIVITAMIN) capsule Take 1 capsule by mouth daily.   OVER THE COUNTER MEDICATION Apply 1 application topically daily as needed (pain). CBD cream   potassium chloride SA (KLOR-CON)  20 MEQ tablet Take 1 tablet (20 mEq total) by mouth as needed. With every dose of lasix   sacubitril-valsartan (ENTRESTO) 24-26 MG Take 1 tablet by mouth 2 (two) times daily.   spironolactone (ALDACTONE) 25 MG tablet TAKE ONE-HALF TABLET BY  MOUTH DAILY     Allergies:   Penicillins and Sulfa antibiotics   Social History   Socioeconomic History   Marital status: Divorced    Spouse name: Not on file   Number of children: Not on file   Years of education: 15   Highest education level: Some  college, no degree  Occupational History   Not on file  Tobacco Use   Smoking status: Former Smoker    Quit date: 1974    Years since quitting: 47.8   Smokeless tobacco: Former Engineer, water and Sexual Activity   Alcohol use: Not Currently    Comment: occ   Drug use: Never   Sexual activity: Not on file  Other Topics Concern   Not on file  Social History Narrative   Not on file   Social Determinants of Health   Financial Resource Strain:    Difficulty of Paying Living Expenses: Not on file  Food Insecurity:    Worried About Programme researcher, broadcasting/film/video in the Last Year: Not on file   [employer] of Food in the Last Year: Not on file  Transportation Needs:    Lack of Transportation (Medical): Not on file   Lack of Transportation (Non-Medical): Not on file  Physical Activity:    Days of Exercise per Week: Not on file   Minutes of Exercise per Session: Not on file  Stress:    Feeling of Stress : Not on file  Social Connections:    Frequency of Communication with Friends and Family: Not on file   Frequency of Social Gatherings with Friends and Family: Not on file   Attends Religious Services: Not on file   Active Member of Clubs or Organizations: Not on file   Attends Banker Meetings: Not on file   Marital Status: Not on file     Family History: The patient's family history includes Cancer in his father; Healthy in his mother; Heart disease in his father.  ROS:   Please see the history of present illness.     All other systems reviewed and are negative.  EKGs/Labs/Other Studies Reviewed:     Recent Labs: 09/18/2019: ALT 41; B Natriuretic Peptide 1,784.5; TSH 7.235 09/24/2019: Magnesium 1.9 10/01/2019: Hemoglobin 9.7; Platelets 258 04/13/2020: BUN 17; Creatinine, Ser 0.74; Potassium 4.4; Sodium 139  Recent Lipid Panel    Component Value Date/Time   CHOL 148 09/19/2019 0238   TRIG 50 09/19/2019 0238   HDL 43 09/19/2019 0238   CHOLHDL 3.4  09/19/2019 0238   VLDL 10 09/19/2019 0238   LDLCALC 95 09/19/2019 0238     Risk Assessment/Calculations:       Physical Exam:    VS:  BP 110/60    Pulse 90    Ht 5\' 5"  (1.651 m)    Wt 161 lb 12.8 oz (73.4 kg)    SpO2 98%    BMI 26.92 kg/m     Wt Readings from Last 3 Encounters:  04/20/20 161 lb 12.8 oz (73.4 kg)  04/13/20 159 lb 3.2 oz (72.2 kg)  03/08/20 160 lb 6.4 oz (72.8 kg)     GEN:  Well nourished, well developed in no acute distress HEENT: Normal NECK: No JVD;  No carotid bruits LYMPHATICS: No lymphadenopathy CARDIAC: RRR, no murmurs, rubs, gallops RESPIRATORY:  Clear to auscultation without rales, wheezing or rhonchi  ABDOMEN: Soft, non-tender, non-distended MUSCULOSKELETAL:  No edema; No deformity  SKIN: Warm and dry NEUROLOGIC:  Alert and oriented x 3 PSYCHIATRIC:  Normal affect   ASSESSMENT:    No diagnosis found. PLAN:    In order of problems listed above:  Chronic systolic heart failure/ischemic cardiomyopathy -Had prior cardiogenic shock.  Doing much better.  EF 20 normal RV.  His echo on 04/13/2020 personally reviewed shows EF of 45 to 50%.  Excellent.  Seems to be doing quite well.  Digoxin was stopped.  Appreciate Dr. Prescott Gum assistance.  He is on low-dose Entresto.  Also on low-dose spironolactone.  Blood pressure soft.  EF is now greater than 35 so no need for ICD. [employer] helping with entresto.   Coronary artery disease -Described as above CABG.  Doing well with aspirin low-dose Coreg Plavix statin.  Pretty much NYHA I right now.  Wonderful.  Spoke to Dr. Gala Romney earlier of advanced heart failure team regarding his excellent recuperation.   Medication Adjustments/Labs and Tests Ordered: Current medicines are reviewed at length with the patient today.  Concerns regarding medicines are outlined above.  No orders of the defined types were placed in this encounter.  No orders of the defined types were placed in this encounter.   Patient  Instructions  Medication Instructions:  Your physician recommends that you continue on your current medications as directed. Please refer to the Current Medication list given to you today.  *If you need a refill on your cardiac medications before your next appointment, please call your pharmacy*  Lab Work: If you have labs (blood work) drawn today and your tests are completely normal, you will receive your results only by:  MyChart Message (if you have MyChart) OR  A paper copy in the mail If you have any lab test that is abnormal or we need to change your treatment, we will call you to review the results.  Testing/Procedures: None ordered today.   Follow-Up: At [facility], you and your health needs are our priority.  As part of our continuing mission to provide you with exceptional heart care, we have created designated Provider Care Teams.  These Care Teams include your primary Cardiologist (physician) and Advanced Practice Providers (APPs -  Physician Assistants and Nurse Practitioners) who all work together to provide you with the care you need, when you need it.  We recommend signing up for the patient portal called "MyChart".  Sign up information is provided on this After Visit Summary.  MyChart is used to connect with patients for Virtual Visits (Telemedicine).  Patients are able to view lab/test results, encounter notes, upcoming appointments, etc.  Non-urgent messages can be sent to your provider as well.   To learn more about what you can do with MyChart, go to [email or web address].    Your next appointment:   4 month(s)  The format for your next appointment:   In Person  Provider:   You may see Dr. Anne Fu         Signed, Donato Schultz, MD  04/20/2020 2:27 PM    [facility]

## 2020-04-23 ENCOUNTER — Ambulatory Visit: Payer: Medicare Other | Attending: Internal Medicine

## 2020-04-23 DIAGNOSIS — Z23 Encounter for immunization: Secondary | ICD-10-CM

## 2020-04-23 NOTE — Progress Notes (Signed)
   Covid-19 Vaccination Clinic  Name:  Dennis Roberts    MRN: [id number] DOB: 1946/06/02  04/23/2020  Mr. Tabet was observed post Covid-19 immunization for 15 minutes without incident. He was provided with Vaccine Information Sheet and instruction to access the V-Safe system.   Mr. Geier was instructed to call 911 with any severe reactions post vaccine: Marland Kitchen Difficulty breathing  . Swelling of face and throat  . A fast heartbeat  . A bad rash all over body  . Dizziness and weakness

## 2020-06-21 ENCOUNTER — Telehealth (HOSPITAL_COMMUNITY): Payer: Self-pay | Admitting: Pharmacy Technician

## 2020-06-21 NOTE — Telephone Encounter (Signed)
It's time to re-enroll patient to receive medication assistance for Entresto from [employer]. Left voicemail for patient to call me back so that we can start the re-enrollment process.  Lashannon Bresnan F Shakera Ebrahimi, CPhT     

## 2020-08-09 ENCOUNTER — Other Ambulatory Visit: Payer: Self-pay

## 2020-08-09 ENCOUNTER — Telehealth: Payer: Self-pay | Admitting: *Deleted

## 2020-08-09 ENCOUNTER — Encounter: Payer: Self-pay | Admitting: Cardiology

## 2020-08-09 ENCOUNTER — Ambulatory Visit: Payer: Medicare Other | Admitting: Cardiology

## 2020-08-09 VITALS — BP 100/60 | HR 81 | Ht 65.0 in | Wt 162.0 lb

## 2020-08-09 DIAGNOSIS — I5022 Chronic systolic (congestive) heart failure: Secondary | ICD-10-CM

## 2020-08-09 DIAGNOSIS — I2510 Atherosclerotic heart disease of native coronary artery without angina pectoris: Secondary | ICD-10-CM | POA: Diagnosis not present

## 2020-08-09 DIAGNOSIS — Z79899 Other long term (current) drug therapy: Secondary | ICD-10-CM

## 2020-08-09 DIAGNOSIS — Z951 Presence of aortocoronary bypass graft: Secondary | ICD-10-CM | POA: Diagnosis not present

## 2020-08-09 MED ORDER — ROSUVASTATIN CALCIUM 20 MG PO TABS
20.0000 mg | ORAL_TABLET | Freq: Every day | ORAL | 3 refills | Status: DC
Start: 2020-08-09 — End: 2021-08-11

## 2020-08-09 NOTE — Telephone Encounter (Signed)
Paperwork faxed today at 3 pm.

## 2020-08-09 NOTE — Patient Instructions (Addendum)
Medication Instructions:  You have discontinue your Plavix when you have finished your current bottle. Please start Crestor 20 mg a day. Continue all other medications as listed.  *If you need a refill on your cardiac medications before your next appointment, please call your pharmacy*  Lab: Please have blood work today (CBC, BMP) And in 3 months check Lipid/ALT.  Follow-Up: At [facility], you and your health needs are our priority.  As part of our continuing mission to provide you with exceptional heart care, we have created designated Provider Care Teams.  These Care Teams include your primary Cardiologist (physician) and Advanced Practice Providers (APPs -  Physician Assistants and Nurse Practitioners) who all work together to provide you with the care you need, when you need it.  We recommend signing up for the patient portal called "MyChart".  Sign up information is provided on this After Visit Summary.  MyChart is used to connect with patients for Virtual Visits (Telemedicine).  Patients are able to view lab/test results, encounter notes, upcoming appointments, etc.  Non-urgent messages can be sent to your provider as well.   To learn more about what you can do with MyChart, go to [email or web address].    Your next appointment:   6 month(s)  The format for your next appointment:   In Person  Provider:   Donato Schultz, MD   Thank you for choosing [facility]!!

## 2020-08-09 NOTE — Telephone Encounter (Signed)
Pt here for an appt today with Dr Anne Fu.  He brings with him his completed pt assistance application.  Dr Anne Fu has signed it and the rest of the form has been completed from MD standpoint.  Advised pt I will fax into [employer] for him.

## 2020-08-09 NOTE — Progress Notes (Signed)
Cardiology Office Note:    Date:  08/09/2020   ID:  Dennis Roberts, DOB [birth year], MRN [id number]  PCP:  Patient, No Pcp Per  [facility] Cardiologist:  Donato Schultz, MD  [facility] Electrophysiologist:  None   Referring MD: No ref. provider found     History of Present Illness:    Dennis Roberts is a 75 y.o. male here for the follow-up of CAD post CABG with chronic systolic heart failure EF less than 20%.  CABG-LIMA to LAD, SVG to diagonal, SVG to OM 1 and 2 by Dr. Vickey Sages.  Brief paroxysmal atrial fibrillation with amiodarone utilized.  Overall been doing quite well.  Is off of digoxin.  NYHA class I.  Has seen Dr. Gala Romney in the past.  Past Medical History:  Diagnosis Date  . CHF (congestive heart failure) (HCC)   . Coronary artery disease     Past Surgical History:  Procedure Laterality Date  . ARTERIAL LINE INSERTION N/A 09/18/2019   Procedure: ARTERIAL LINE INSERTION;  Surgeon: Dolores Patty, MD;  Location: [facility];  Service: Cardiovascular;  Laterality: N/A;  . CARDIAC CATHETERIZATION    . CORONARY ARTERY BYPASS GRAFT N/A 09/19/2019   Procedure: CORONARY ARTERY BYPASS GRAFTING (CABG) using LIMA to LAD; Endoscopic harvest right saphenous vein: SVG tp Diag; SVG sequential to OM1 and OM2.;  Surgeon: Linden Dolin, MD;  Location: [facility] OR;  Service: Open Heart Surgery;  Laterality: N/A;  . ENDOVEIN HARVEST OF GREATER SAPHENOUS VEIN Right 09/19/2019   Procedure: Mack Guise Of Greater Saphenous Vein;  Surgeon: Linden Dolin, MD;  Location: [facility];  Service: Open Heart Surgery;  Laterality: Right;  . IR THORACENTESIS ASP PLEURAL SPACE W/IMG GUIDE  10/16/2019  . NOSE SURGERY    . PATENT DUCTUS ARTERIOUS REPAIR N/A 09/19/2019   Procedure: CLOSURE OF PATENT DUCTUS ARTERIOSUS;  Surgeon: Linden Dolin, MD;  Location: [facility] OR;  Service: Open Heart Surgery;  Laterality: N/A;  . RIGHT/LEFT HEART CATH AND CORONARY ANGIOGRAPHY N/A 09/18/2019   Procedure:  RIGHT/LEFT HEART CATH AND CORONARY ANGIOGRAPHY;  Surgeon: Dolores Patty, MD;  Location: [facility];  Service: Cardiovascular;  Laterality: N/A;  . TEE WITHOUT CARDIOVERSION N/A 09/19/2019   Procedure: TRANSESOPHAGEAL ECHOCARDIOGRAM (TEE);  Surgeon: Linden Dolin, MD;  Location: [facility] OR;  Service: Open Heart Surgery;  Laterality: N/A;  . VENTRICULAR ASSIST DEVICE INSERTION N/A 09/18/2019   Procedure: VENTRICULAR ASSIST DEVICE INSERTION;  Surgeon: Dolores Patty, MD;  Location: [facility];  Service: Cardiovascular;  Laterality: N/A;    Current Medications: Current Meds  Medication Sig  . aspirin EC 81 MG tablet Take 1 tablet (81 mg total) by mouth daily.  . carvedilol (COREG) 3.125 MG tablet Take 1 tablet (3.125 mg total) by mouth 2 (two) times daily with a meal.  . furosemide (LASIX) 40 MG tablet Take 1 tablet (40 mg total) by mouth daily as needed (for weight gain of 2 pounds or more in 24 hours).  . lactobacillus acidophilus (BACID) TABS tablet Take 1 tablet by mouth daily.   . Multiple Vitamin (MULTIVITAMIN) capsule Take 1 capsule by mouth daily.  Marland Kitchen OVER THE COUNTER MEDICATION Apply 1 application topically daily as needed (pain). CBD cream  . potassium chloride SA (KLOR-CON) 20 MEQ tablet Take 1 tablet (20 mEq total) by mouth as needed. With every dose of lasix  . rosuvastatin (CRESTOR) 20 MG tablet Take 1 tablet (20 mg total) by mouth  daily.  . sacubitril-valsartan (ENTRESTO) 24-26 MG Take 1 tablet by mouth 2 (two) times daily.  Marland Kitchen spironolactone (ALDACTONE) 25 MG tablet Take 0.5 tablets (12.5 mg total) by mouth daily.  . [DISCONTINUED] clopidogrel (PLAVIX) 75 MG tablet Take 1 tablet (75 mg total) by mouth daily.     Allergies:   Penicillins and Sulfa antibiotics   Social History   Socioeconomic History  . Marital status: Divorced    Spouse name: Not on file  . Number of children: Not on file  . Years of education: 79  . Highest education level: Some  college, no degree  Occupational History  . Not on file  Tobacco Use  . Smoking status: Former Smoker    Quit date: 1974    Years since quitting: 48.1  . Smokeless tobacco: Former Engineer, water and Sexual Activity  . Alcohol use: Not Currently    Comment: occ  . Drug use: Never  . Sexual activity: Not on file  Other Topics Concern  . Not on file  Social History Narrative  . Not on file   Social Determinants of Health   Financial Resource Strain: Not on file  Food Insecurity: Not on file  Transportation Needs: Not on file  Physical Activity: Not on file  Stress: Not on file  Social Connections: Not on file     Family History: The patient's family history includes Cancer in his father; Healthy in his mother; Heart disease in his father.  ROS:   Please see the history of present illness.     All other systems reviewed and are negative.  EKGs/Labs/Other Studies Reviewed:      Recent Labs: 09/18/2019: ALT 41; B Natriuretic Peptide 1,784.5; TSH 7.235 09/24/2019: Magnesium 1.9 10/01/2019: Hemoglobin 9.7; Platelets 258 04/13/2020: BUN 17; Creatinine, Ser 0.74; Potassium 4.4; Sodium 139  Recent Lipid Panel    Component Value Date/Time   CHOL 148 09/19/2019 0238   TRIG 50 09/19/2019 0238   HDL 43 09/19/2019 0238   CHOLHDL 3.4 09/19/2019 0238   VLDL 10 09/19/2019 0238   LDLCALC 95 09/19/2019 0238     Risk Assessment/Calculations:      Physical Exam:    VS:  BP 100/60 (BP Location: Left Arm, Patient Position: Sitting, Cuff Size: Normal)   Pulse 81   Ht 5\' 5"  (1.651 m)   Wt 162 lb (73.5 kg)   SpO2 98%   BMI 26.96 kg/m     Wt Readings from Last 3 Encounters:  08/09/20 162 lb (73.5 kg)  04/20/20 161 lb 12.8 oz (73.4 kg)  04/13/20 159 lb 3.2 oz (72.2 kg)     GEN:  Well nourished, well developed in no acute distress HEENT: Normal NECK: No JVD; No carotid bruits LYMPHATICS: No lymphadenopathy CARDIAC: RRR, no murmurs, rubs, gallops RESPIRATORY:  Clear to  auscultation without rales, wheezing or rhonchi  ABDOMEN: Soft, non-tender, non-distended MUSCULOSKELETAL:  No edema; No deformity  SKIN: Warm and dry NEUROLOGIC:  Alert and oriented x 3 PSYCHIATRIC:  Normal affect   ASSESSMENT:    1. Chronic systolic heart failure (HCC)   2. S/P CABG x 4   3. Coronary artery disease involving native coronary artery of native heart without angina pectoris   4. Medication management    PLAN:    In order of problems listed above:  Coronary artery disease -Prior CABG doing well.  No anginal symptoms.  Continue with low-dose aspirin, carvedilol, Plavix.  Since he is almost at 1 year post bypass,  we will have him stop his Plavix when he runs out of his medication.  Continue with aspirin lifelong.  We will go ahead and make sure that he is on statin therapy.  Crestor 20 mg a day.  In 3 months we will check a lipid panel and ALT.  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  EF at 1 point was 20%.  Echocardiogram on repeat 04/13/2020 shows EF of 45 to 50%.  Improved.  On low-dose Entresto as well as spironolactone.  Blood pressure fairly soft. -No need for ICD secondary to improvement in EF.  NYHA class I.       Medication Adjustments/Labs and Tests Ordered: Current medicines are reviewed at length with the patient today.  Concerns regarding medicines are outlined above.  Orders Placed This Encounter  Procedures  . CBC  . Basic metabolic panel  . Lipid panel  . ALT   Meds ordered this encounter  Medications  . rosuvastatin (CRESTOR) 20 MG tablet    Sig: Take 1 tablet (20 mg total) by mouth daily.    Dispense:  90 tablet    Refill:  3    Patient Instructions  Medication Instructions:  You have discontinue your Plavix when you have finished your current bottle. Please start Crestor 20 mg a day. Continue all other medications as listed.  *If you need a refill on your cardiac medications before your  next appointment, please call your pharmacy*  Lab: Please have blood work today (CBC, BMP) And in 3 months check Lipid/ALT.  Follow-Up: At [facility], you and your health needs are our priority.  As part of our continuing mission to provide you with exceptional heart care, we have created designated Provider Care Teams.  These Care Teams include your primary Cardiologist (physician) and Advanced Practice Providers (APPs -  Physician Assistants and Nurse Practitioners) who all work together to provide you with the care you need, when you need it.  We recommend signing up for the patient portal called "MyChart".  Sign up information is provided on this After Visit Summary.  MyChart is used to connect with patients for Virtual Visits (Telemedicine).  Patients are able to view lab/test results, encounter notes, upcoming appointments, etc.  Non-urgent messages can be sent to your provider as well.   To learn more about what you can do with MyChart, go to [email or web address].    Your next appointment:   6 month(s)  The format for your next appointment:   In Person  Provider:   Donato Schultz, MD   Thank you for choosing [facility]!!        Signed, Donato Schultz, MD  08/09/2020 3:31 PM    [facility]

## 2020-08-10 LAB — CBC
Hematocrit: 38.2 % (ref 37.5–51.0)
Hemoglobin: 13.4 g/dL (ref 13.0–17.7)
MCH: 31.5 pg (ref 26.6–33.0)
MCHC: 35.1 g/dL (ref 31.5–35.7)
MCV: 90 fL (ref 79–97)
Platelets: 128 10*3/uL — ABNORMAL LOW (ref 150–450)
RBC: 4.26 x10E6/uL (ref 4.14–5.80)
RDW: 12.9 % (ref 11.6–15.4)
WBC: 5.5 10*3/uL (ref 3.4–10.8)

## 2020-08-10 LAB — BASIC METABOLIC PANEL
BUN/Creatinine Ratio: 33 — ABNORMAL HIGH (ref 10–24)
BUN: 22 mg/dL (ref 8–27)
CO2: 24 mmol/L (ref 20–29)
Calcium: 9.7 mg/dL (ref 8.6–10.2)
Chloride: 104 mmol/L (ref 96–106)
Creatinine, Ser: 0.67 mg/dL — ABNORMAL LOW (ref 0.76–1.27)
GFR calc Af Amer: 109 mL/min/{1.73_m2} (ref >59)
GFR calc non Af Amer: 95 mL/min/{1.73_m2} (ref >59)
Glucose: 109 mg/dL — ABNORMAL HIGH (ref 65–99)
Potassium: 4.6 mmol/L (ref 3.5–5.2)
Sodium: 142 mmol/L (ref 134–144)

## 2020-08-12 ENCOUNTER — Telehealth (HOSPITAL_COMMUNITY): Payer: Self-pay | Admitting: Pharmacy Technician

## 2020-08-12 NOTE — Telephone Encounter (Signed)
Advanced Heart Failure Patient Advocate Encounter   Patient was approved to receive Entresto from [employer]  Patient ID: [id number] Effective dates: 08/12/20 through 07/08/21  Attempted to call patient, no way to leave message.  Archer Asa, CPhT

## 2020-11-22 ENCOUNTER — Other Ambulatory Visit: Payer: Self-pay

## 2020-11-22 ENCOUNTER — Other Ambulatory Visit: Payer: Medicare Other

## 2020-11-22 DIAGNOSIS — I2510 Atherosclerotic heart disease of native coronary artery without angina pectoris: Secondary | ICD-10-CM

## 2020-11-22 DIAGNOSIS — Z79899 Other long term (current) drug therapy: Secondary | ICD-10-CM | POA: Diagnosis not present

## 2020-11-22 LAB — LIPID PANEL
Chol/HDL Ratio: 2.2 ratio (ref 0.0–5.0)
Cholesterol, Total: 104 mg/dL (ref 100–199)
HDL: 47 mg/dL (ref >39)
LDL Chol Calc (NIH): 41 mg/dL (ref 0–99)
Triglycerides: 81 mg/dL (ref 0–149)
VLDL Cholesterol Cal: 16 mg/dL (ref 5–40)

## 2020-11-22 LAB — ALT: ALT: 21 IU/L (ref 0–44)

## 2021-02-17 ENCOUNTER — Encounter: Payer: Self-pay | Admitting: Cardiology

## 2021-02-17 ENCOUNTER — Other Ambulatory Visit: Payer: Self-pay

## 2021-02-17 ENCOUNTER — Ambulatory Visit: Payer: Medicare Other | Admitting: Cardiology

## 2021-02-17 VITALS — BP 120/60 | HR 77 | Ht 65.0 in | Wt 164.0 lb

## 2021-02-17 DIAGNOSIS — D696 Thrombocytopenia, unspecified: Secondary | ICD-10-CM

## 2021-02-17 DIAGNOSIS — Z79899 Other long term (current) drug therapy: Secondary | ICD-10-CM

## 2021-02-17 DIAGNOSIS — I5022 Chronic systolic (congestive) heart failure: Secondary | ICD-10-CM | POA: Diagnosis not present

## 2021-02-17 DIAGNOSIS — I2510 Atherosclerotic heart disease of native coronary artery without angina pectoris: Secondary | ICD-10-CM | POA: Diagnosis not present

## 2021-02-17 DIAGNOSIS — I480 Paroxysmal atrial fibrillation: Secondary | ICD-10-CM

## 2021-02-17 DIAGNOSIS — E782 Mixed hyperlipidemia: Secondary | ICD-10-CM | POA: Diagnosis not present

## 2021-02-17 LAB — CBC
Hematocrit: 38.3 % (ref 37.5–51.0)
Hemoglobin: 13.2 g/dL (ref 13.0–17.7)
MCH: 31.4 pg (ref 26.6–33.0)
MCHC: 34.5 g/dL (ref 31.5–35.7)
MCV: 91 fL (ref 79–97)
Platelets: 103 10*3/uL — ABNORMAL LOW (ref 150–450)
RBC: 4.2 x10E6/uL (ref 4.14–5.80)
RDW: 12.7 % (ref 11.6–15.4)
WBC: 5.2 10*3/uL (ref 3.4–10.8)

## 2021-02-17 LAB — BASIC METABOLIC PANEL
BUN/Creatinine Ratio: 15 (ref 10–24)
BUN: 13 mg/dL (ref 8–27)
CO2: 23 mmol/L (ref 20–29)
Calcium: 9.4 mg/dL (ref 8.6–10.2)
Chloride: 104 mmol/L (ref 96–106)
Creatinine, Ser: 0.84 mg/dL (ref 0.76–1.27)
Glucose: 101 mg/dL — ABNORMAL HIGH (ref 65–99)
Potassium: 4.2 mmol/L (ref 3.5–5.2)
Sodium: 139 mmol/L (ref 134–144)
eGFR: 92 mL/min/{1.73_m2} (ref >59)

## 2021-02-17 MED ORDER — CARVEDILOL 3.125 MG PO TABS
3.1250 mg | ORAL_TABLET | Freq: Two times a day (BID) | ORAL | 3 refills | Status: DC
Start: 2021-02-17 — End: 2022-01-12

## 2021-02-17 NOTE — Patient Instructions (Signed)
Medication Instructions:  You may hold your Crestor for 2 weeks then restart at 10 mg a day.  Please call to make [place] aware of how you are feeling after you restart. Continue all other medications as listed.  *If you need a refill on your cardiac medications before your next appointment, please call your pharmacy*  Lab Work: Please have blood work today (BMP, CBC)  If you have labs (blood work) drawn today and your tests are completely normal, you will receive your results only by: MyChart Message (if you have MyChart) OR A paper copy in the mail If you have any lab test that is abnormal or we need to change your treatment, we will call you to review the results.  Follow-Up: At [facility], you and your health needs are our priority.  As part of our continuing mission to provide you with exceptional heart care, we have created designated Provider Care Teams.  These Care Teams include your primary Cardiologist (physician) and Advanced Practice Providers (APPs -  Physician Assistants and Nurse Practitioners) who all work together to provide you with the care you need, when you need it.  We recommend signing up for the patient portal called "MyChart".  Sign up information is provided on this After Visit Summary.  MyChart is used to connect with patients for Virtual Visits (Telemedicine).  Patients are able to view lab/test results, encounter notes, upcoming appointments, etc.  Non-urgent messages can be sent to your provider as well.   To learn more about what you can do with MyChart, go to [email or web address].    Your next appointment:   1 year(s)  The format for your next appointment:   In Person  Provider:   Donato Schultz, MD  Thank you for choosing [facility]!!

## 2021-02-17 NOTE — Assessment & Plan Note (Signed)
Brief postoperative atrial fibrillation with short-term amiodarone use.  No further evidence of atrial fibrillation.  Excellent.

## 2021-02-17 NOTE — Assessment & Plan Note (Signed)
CABG 09/2019.  Dr. Vickey Sages.  LIMA to LAD SVG to diagonal SVG sequential to OM1 and OM 2.  Overall doing quite well.  Continue with goal-directed medical therapy.  Aspirin 81 mg.  No anginal symptoms.  Scar well-healed.  Currently on low-dose carvedilol 3.125 mg twice a day, Entresto low-dose 24/26 twice a day, spironolactone 0.5 tablets per 12.5 mg total daily, Lasix as needed 40 mg and potassium 20 mEq as needed.  Doing well.  Continue with refills.

## 2021-02-17 NOTE — Assessment & Plan Note (Signed)
He has been having some aches with his Crestor 20 mg and starting about 6 months ago.  His LDL has been excellent at 41.  Normal liver function.  We propose that he take a 2-week Crestor holiday.  After 2 weeks reassess.  Restart at 10 mg of Crestor, half dose and see how he feels.  He will call us with results.  He does states that overall he feels "older ".  He has had some hand aches as well.  Expressed the importance of statin medication.

## 2021-02-17 NOTE — Progress Notes (Signed)
Cardiology Office Note:    Date:  02/17/2021   ID:  Dennis Roberts, DOB 1945/11/15, MRN [id number]  PCP:  Patient, No Pcp Per (Inactive)  [facility] Cardiologist:  Donato Schultz, MD  [facility] Electrophysiologist:  None   Referring MD: No ref. provider found   History of Present Illness:    Dennis Roberts is a 75 y.o. male here for the follow-up of CAD s/p CABG, CHF, and IDCM.  CABG 09/2019-LIMA to LAD, SVG to diagonal, SVG to OM 1 and 2 by Dr. Vickey Roberts.  Brief paroxysmal atrial fibrillation post op with amiodarone utilized. Is off of digoxin.  NYHA class I.  Has seen Dr. Gala Roberts in the past.  Today: Overall, he seems to be doing well. Physically, he does not feel limited. He is able to work in the yard with no issues.   However, since beginning Crestor at his last visit, he has not been at his baseline. He describes this as being listless and "feeling older." This feeling has been increasing gradually. Also, he notes that the joints in his bilateral hands ache.  He confirms that he does take 81 mg aspirin daily. He requires a new prescription for the 3.125 mg dose of Carvedilol.   He denies any palpitations, chest pain, or shortness of breath. No lightheadedness, headaches, syncope, orthopnea, or PND. Also has no lower extremity edema or exertional symptoms.   Past Medical History:  Diagnosis Date   CHF (congestive heart failure) (HCC)    Coronary artery disease     Past Surgical History:  Procedure Laterality Date   ARTERIAL LINE INSERTION N/A 09/18/2019   Procedure: ARTERIAL LINE INSERTION;  Surgeon: Dennis Patty, MD;  Location: [facility] INVASIVE CV LAB;  Service: Cardiovascular;  Laterality: N/A;   CARDIAC CATHETERIZATION     CORONARY ARTERY BYPASS GRAFT N/A 09/19/2019   Procedure: CORONARY ARTERY BYPASS GRAFTING (CABG) using LIMA to LAD; Endoscopic harvest right saphenous vein: SVG tp Diag; SVG sequential to OM1 and OM2.;  Surgeon: Dennis Dolin, MD;  Location: [facility]  OR;  Service: Open Heart Surgery;  Laterality: N/A;   ENDOVEIN HARVEST OF GREATER SAPHENOUS VEIN Right 09/19/2019   Procedure: Mack Guise Of Greater Saphenous Vein;  Surgeon: Dennis Dolin, MD;  Location: [facility] OR;  Service: Open Heart Surgery;  Laterality: Right;   IR THORACENTESIS ASP PLEURAL SPACE W/IMG GUIDE  10/16/2019   NOSE SURGERY     PATENT DUCTUS ARTERIOUS REPAIR N/A 09/19/2019   Procedure: CLOSURE OF PATENT DUCTUS ARTERIOSUS;  Surgeon: Dennis Dolin, MD;  Location: [facility] OR;  Service: Open Heart Surgery;  Laterality: N/A;   RIGHT/LEFT HEART CATH AND CORONARY ANGIOGRAPHY N/A 09/18/2019   Procedure: RIGHT/LEFT HEART CATH AND CORONARY ANGIOGRAPHY;  Surgeon: Dennis Patty, MD;  Location: [facility] INVASIVE CV LAB;  Service: Cardiovascular;  Laterality: N/A;   TEE WITHOUT CARDIOVERSION N/A 09/19/2019   Procedure: TRANSESOPHAGEAL ECHOCARDIOGRAM (TEE);  Surgeon: Dennis Dolin, MD;  Location: [facility] OR;  Service: Open Heart Surgery;  Laterality: N/A;   VENTRICULAR ASSIST DEVICE INSERTION N/A 09/18/2019   Procedure: VENTRICULAR ASSIST DEVICE INSERTION;  Surgeon: Dennis Patty, MD;  Location: [facility] INVAS[facility] CV LAB;  Service: Cardiovascular;  Laterality: N/A;    Current Medications: Current Meds  Medication Sig   aspirin EC 81 MG tablet Take 81 mg by mouth daily. Swallow whole.   carvedilol (COREG) 3.125 MG tablet Take 1 tablet (3.125 mg total) by mouth 2 (two) times daily.   furosemide (LASIX)  40 MG tablet Take 1 tablet (40 mg total) by mouth daily as needed (for weight gain of 2 pounds or more in 24 hours).   lactobacillus acidophilus (BACID) TABS tablet Take 1 tablet by mouth daily.    Multiple Vitamin (MULTIVITAMIN) capsule Take 1 capsule by mouth daily.   OVER THE COUNTER MEDICATION Apply 1 application topically daily as needed (pain). CBD cream   potassium chloride SA (KLOR-CON) 20 MEQ tablet Take 1 tablet (20 mEq total) by mouth as needed. With every dose of lasix   rosuvastatin  (CRESTOR) 20 MG tablet Take 1 tablet (20 mg total) by mouth daily.   sacubitril-valsartan (ENTRESTO) 24-26 MG Take 1 tablet by mouth 2 (two) times daily.   spironolactone (ALDACTONE) 25 MG tablet Take 0.5 tablets (12.5 mg total) by mouth daily.   [DISCONTINUED] carvedilol (COREG) 3.125 MG tablet Take 1 tablet (3.125 mg total) by mouth 2 (two) times daily with a meal.     Allergies:   Penicillins and Sulfa antibiotics   Social History   Socioeconomic History   Marital status: Divorced    Spouse name: Not on file   Number of children: Not on file   Years of education: 15   Highest education level: Some college, no degree  Occupational History   Not on file  Tobacco Use   Smoking status: Former    Types: Cigarettes    Quit date: 1974    Years since quitting: 48.6   Smokeless tobacco: Former  Substance and Sexual Activity   Alcohol use: Not Currently    Comment: occ   Drug use: Never   Sexual activity: Not on file  Other Topics Concern   Not on file  Social History Narrative   Not on file   Social Determinants of Health   Financial Resource Strain: Not on file  Food Insecurity: Not on file  Transportation Needs: Not on file  Physical Activity: Not on file  Stress: Not on file  Social Connections: Not on file     Family History: The patient's family history includes Cancer in his father; Healthy in his mother; Heart disease in his father.  ROS:   Please see the history of present illness.    (+) Aches in bilateral hand joints All other systems reviewed and are negative.  EKGs/Labs/Other Studies Reviewed:    Echo 04/13/2020:  1. Left ventricular ejection fraction, by estimation, is 40 to 45%. Left  ventricular ejection fraction by 2D MOD biplane is 43.2 %. The left  ventricle has mildly decreased function. The left ventricle demonstrates  global hypokinesis. Left ventricular  diastolic parameters are consistent with Grade I diastolic dysfunction  (impaired  relaxation).   2. Right ventricular systolic function is mildly reduced. The right  ventricular size is normal.   3. Left atrial size was mildly dilated.   4. Right atrial size was mildly dilated.   5. The mitral valve is normal in structure. Trivial mitral valve  regurgitation.   6. The aortic valve is tricuspid. Aortic valve regurgitation is not  visualized.   7. The inferior vena cava is normal in size with greater than 50%  respiratory variability, suggesting right atrial pressure of 3 mmHg.   Comparison(s): Changes from prior study are noted. Compared to prior  study, significant improvement in LV and RV function.   RHC/LHC 09/18/2019: Prox RCA lesion is 99% stenosed. Dist RCA lesion is 100% stenosed. Mid LM to Dist LM lesion is 99% stenosed. Ost Cx to Prox  Cx lesion is 99% stenosed. Mid Cx to Dist Cx lesion is 60% stenosed. LPAV lesion is 80% stenosed. Ost LAD lesion is 90% stenosed. Prox LAD to Mid LAD lesion is 80% stenosed. Mid LAD lesion is 80% stenosed.   Findings: Ao = 155/78 (96) LV = 114/39 RA = 13 RV = 50/12 PA = 52/32 (41) PCW = 33 Fick cardiac output/index = 3.7/1.84 Thermo CO/CI = 3.7/1.87 PVR = 2.1 WU SVR = 1809  FA sat = 97% PA sat = 62%, 62% CPO = 0.78 (on milrinone 0.25)   Assessment: 1. 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 Cardiogenic shock   Plan/Discussion: He has been stabilized with Impella support. Transfer to ICU. TCTS has seen. Likely emergent cath in am.   EKG: 02/17/2021: EKG is not ordered today.  Recent Labs: 08/09/2020: BUN 22; Creatinine, Ser 0.67; Hemoglobin 13.4; Platelets 128; Potassium 4.6; Sodium 142 11/22/2020: ALT 21   Recent Lipid Panel    Component Value Date/Time   CHOL 104 11/22/2020 0834   TRIG 81 11/22/2020 0834   HDL 47 11/22/2020 0834   CHOLHDL 2.2 11/22/2020 0834   CHOLHDL 3.4 09/19/2019 0238   VLDL 10 09/19/2019 0238   LDLCALC 41 11/22/2020 0834     Risk Assessment/Calculations:      Physical Exam:     VS:  BP 120/60 (BP Location: Left Arm, Patient Position: Sitting, Cuff Size: Normal)   Pulse 77   Ht 5\' 5"  (1.651 m)   Wt 164 lb (74.4 kg)   SpO2 98%   BMI 27.29 kg/m     Wt Readings from Last 3 Encounters:  02/17/21 164 lb (74.4 kg)  08/09/20 162 lb (73.5 kg)  04/20/20 161 lb 12.8 oz (73.4 kg)    GEN: Well nourished, well developed in no acute distress HEENT: Normal NECK: No JVD; No carotid bruits LYMPHATICS: No lymphadenopathy CARDIAC: RRR, no murmurs, rubs, gallops RESPIRATORY:  Clear to auscultation without rales, wheezing or rhonchi  ABDOMEN: Soft, non-tender, non-distended MUSCULOSKELETAL:  No edema; No deformity  SKIN: Warm and dry NEUROLOGIC:  Alert and oriented x 3 PSYCHIATRIC:  Normal affect    ASSESSMENT:    1. Coronary artery disease involving native coronary artery of native heart without angina pectoris   2. PAF (paroxysmal atrial fibrillation) (HCC)   3. Medication management   4. Chronic systolic heart failure (HCC)   5. Mixed hyperlipidemia     PLAN:   In order of problems listed above:  Chronic systolic heart failure (HCC) CABG 09/2019.  Dr. 10/2019.  LIMA to LAD SVG to diagonal SVG sequential to OM1 and OM 2.  Overall doing quite well.  Continue with goal-directed medical therapy.  Aspirin 81 mg.  No anginal symptoms.  Scar well-healed.  Currently on low-dose carvedilol 3.125 mg twice a day, Entresto low-dose 24/26 twice a day, spironolactone 0.5 tablets per 12.5 mg total daily, Lasix as needed 40 mg and potassium 20 mEq as needed.  Doing well.  Continue with refills.  Mixed hyperlipidemia He has been having some aches with his Crestor 20 mg and starting about 6 months ago.  His LDL has been excellent at 41.  Normal liver function.  We propose that he take a 2-week Crestor holiday.  After 2 weeks reassess.  Restart at 10 mg of Crestor, half dose and see how he feels.  He will call Dennis Roberts with results.  He does states that overall he feels "older ".  He has  had some hand  aches as well.  Expressed the importance of statin medication.  PAF (paroxysmal atrial fibrillation) (HCC) Brief postoperative atrial fibrillation with short-term amiodarone use.  No further evidence of atrial fibrillation.  Excellent.    Follow-up: 1 year.   Medication Adjustments/Labs and Tests Ordered: Current medicines are reviewed at length with the patient today.  Concerns regarding medicines are outlined above.   Orders Placed This Encounter  Procedures   CBC   Basic metabolic panel    Meds ordered this encounter  Medications   carvedilol (COREG) 3.125 MG tablet    Sig: Take 1 tablet (3.125 mg total) by mouth 2 (two) times daily.    Dispense:  180 tablet    Refill:  3    Patient Instructions  Medication Instructions:  You may hold your Crestor for 2 weeks then restart at 10 mg a day.  Please call to make us aware of how you are feeling after you restart. Continue all other medications as listed.  *If you need a refill on your cardiac medications before your next appointment, please call your pharmacy*  Lab Work: Please have blood work today (BMP, CBC)  If you have labs (blood work) drawn today and your tests are completely normal, you will receive your results only by: MyChart Message (if you have MyChart) OR A paper copy in the mail If you have any lab test that is abnormal or we need to change your treatment, we will call you to review the results.  Follow-Up: [facility], you and your health needs are our priority.  As part of our continuing mission to provide you with exceptional heart care, we have created designated Provider Care Teams.  These Care Teams include your primary Cardiologist (physician) and Advanced Practice Providers (APPs -  Physician Assistants and Nurse Practitioners) who all work together to provide you with the care you need, when you need it.  We recommend signing up for the patient portal called "MyChart".  Sign up  information is provided on this After Visit Summary.  MyChart is used to connect with patients for Virtual Visits (Telemedicine).  Patients are able to view lab/test results, encounter notes, upcoming appointments, etc.  Non-urgent messages can be sent to your provider as well.   To learn more about what you can do with MyChart, go to [email or web address].    Your next appointment:   1 year(s)  The format for your next appointment:   In Person  Provider:   Donato SchultzMark Jayla Mackie, MD  Thank you for choosing [facility]!!     I,Mathew Stumpf,acting as a scribe for Donato SchultzMark De Jaworski, MD.,have documented all relevant documentation on the behalf of Donato SchultzMark Reign Dziuba, MD,as directed by  Donato SchultzMark Montie Swiderski, MD while in the presence of Donato SchultzMark Jennifer Holland, MD.  I, Donato SchultzMark Teara Duerksen, MD, have reviewed all documentation for this visit. The documentation on 02/17/21 for the exam, diagnosis, procedures, and orders are all accurate and complete.   Signed, Donato SchultzMark Cleotha Tsang, MD  02/17/2021 9:11 AM    [facility]

## 2021-02-20 NOTE — Addendum Note (Signed)
Addended by: Sharin Grave on: 02/20/2021 02:26 PM   Modules accepted: Orders

## 2021-02-21 ENCOUNTER — Telehealth: Payer: Self-pay | Admitting: Nurse Practitioner

## 2021-02-21 NOTE — Telephone Encounter (Signed)
Received a new hem referral from Dr. Anne Fu for thrombocytopenia. Dennis Roberts returned my call and has been scheduled to see [name] on 8/24 at 11am. Pt aware to arrive 20 minutes early.

## 2021-02-28 NOTE — Progress Notes (Addendum)
[facility]   Telephone:(336) [phone] Fax:(336) [phone]   Clinic New consult Note   Patient Care Team: Patient, No Pcp Per (Inactive) as PCP - General (General Practice) Jake Bathe, MD as PCP - Cardiology (Cardiology) 03/01/2021  CHIEF COMPLAINTS/PURPOSE OF CONSULTATION:  Thrombocytopenia, referred by Dr. Donato Schultz  HISTORY OF PRESENTING ILLNESS:  Dennis Roberts 75 y.o. male with PMH including HL, A. fib, and CHF is here because of thrombocytopenia.  He has no previous medical care or prior CBC.  He was admitted to the hospital 09/18/2019 for acute heart failure and MI s/p CABG x4, started on Entresto and Plavix at the time.  Platelet count was normal 179K on 09/18/2019 the day of admission, trended down lowest 46K in the hospital with anemia.  Platelet count normalized as of 09/27/19 and anemia eventually resolved as well.  On 08/09/2020 platelet was found to be 128K and decreased further to 103K on 02/17/2021.  Patient denies any history of autoimmune disorder, hepatitis, liver disease, splenomegaly, infection, B12 deficiency, or bleeding.  He denies bruising after coming off Plavix 6 months ago.  He has never had imaging of the abdomen.  Socially, he is single and lives alone.  He is retired from Human resources officer.  He is independent with ADLs.  He has 1 adopted son.  He denies family history of blood disorder.  His father had prostate and bladder cancer, mother had melanoma.  Patient has not had any of his own cancer screenings and is not currently interested.  He is a former cigarette smoker 1 pack/day x 6 years quit in 1974.  He drinks a beer daily, more on the weekends, and heavier use in his younger years.  He drinks about 12 beers per week currently.  He smokes marijuana rarely.  Today, he presents by himself, feels well after stopping Crestor 2 weeks ago, this made him feel "old" with no energy. He has mild rash on his face, no bilateral joint pain or h/o  autoimmune disorder. Denies bruising or bleeding. Denies recent fever, chills, cough, chest pain, dyspnea, pain, or other specific concerns.    MEDICAL HISTORY:  Past Medical History:  Diagnosis Date   CHF (congestive heart failure) (HCC)    Coronary artery disease     SURGICAL HISTORY: Past Surgical History:  Procedure Laterality Date   ARTERIAL LINE INSERTION N/A 09/18/2019   Procedure: ARTERIAL LINE INSERTION;  Surgeon: Dolores Patty, MD;  Location: [facility] INVASIVE CV LAB;  Service: Cardiovascular;  Laterality: N/A;   CARDIAC CATHETERIZATION     CORONARY ARTERY BYPASS GRAFT N/A 09/19/2019   Procedure: CORONARY ARTERY BYPASS GRAFTING (CABG) using LIMA to LAD; Endoscopic harvest right saphenous vein: SVG tp Diag; SVG sequential to OM1 and OM2.;  Surgeon: Linden Dolin, MD;  Location: [facility] OR;  Service: Open Heart Surgery;  Laterality: N/A;   ENDOVEIN HARVEST OF GREATER SAPHENOUS VEIN Right 09/19/2019   Procedure: Mack Guise Of Greater Saphenous Vein;  Surgeon: Linden Dolin, MD;  Location: [facility] OR;  Service: Open Heart Surgery;  Laterality: Right;   IR THORACENTESIS ASP PLEURAL SPACE W/IMG GUIDE  10/16/2019   NOSE SURGERY     PATENT DUCTUS ARTERIOUS REPAIR N/A 09/19/2019   Procedure: CLOSURE OF PATENT DUCTUS ARTERIOSUS;  Surgeon: Linden Dolin, MD;  Location: [facility] OR;  Service: Open Heart Surgery;  Laterality: N/A;   RIGHT/LEFT HEART CATH AND CORONARY ANGIOGRAPHY N/A 09/18/2019   Procedure: RIGHT/LEFT HEART CATH AND CORONARY ANGIOGRAPHY;  Surgeon: Dolores Patty, MD;  Location: [facility] INVASIVE CV LAB;  Service: Cardiovascular;  Laterality: N/A;   TEE WITHOUT CARDIOVERSION N/A 09/19/2019   Procedure: TRANSESOPHAGEAL ECHOCARDIOGRAM (TEE);  Surgeon: Linden Dolin, MD;  Location: [facility] OR;  Service: Open Heart Surgery;  Laterality: N/A;   VENTRICULAR ASSIST DEVICE INSERTION N/A 09/18/2019   Procedure: VENTRICULAR ASSIST DEVICE INSERTION;  Surgeon: Dolores Patty, MD;  [facility] INVASIVE CV LAB;  Service: Cardiovascular;  Laterality: N/A;    SOCIAL HISTORY: Social History   Socioeconomic History   Marital status: Divorced    Spouse name: Not on file   Number of children: Not on file   Years of education: 15   Highest education level: Some college, no degree  Occupational History   Not on file  Tobacco Use   Smoking status: Former    Packs/day: 1.00    Years: 6.00    Pack years: 6.00    Types: Cigarettes    Quit date: 1974    Years since quitting: 48.6   Smokeless tobacco: Former  Building services engineer Use: Never used  Substance and Sexual Activity   Alcohol use: Yes    Alcohol/week: 12.0 standard drinks    Types: 12 Cans of beer per week    Comment: 12 beers per week, sometimes more   Drug use: Yes    Types: Marijuana    Comment: rarely   Sexual activity: Not on file  Other Topics Concern   Not on file  Social History Narrative   Not on file   Social Determinants of Health   Financial Resource Strain: Not on file  Food Insecurity: Not on file  Transportation Needs: Not on file  Physical Activity: Not on file  Stress: Not on file  Social Connections: Not on file  Intimate Partner Violence: Not on file    FAMILY HISTORY: Family History  Problem Relation Age of Onset   Cancer Mother        melanoma   Healthy Mother    Cancer Father        prostate, bladder   Heart disease Father        bypass at 69    ALLERGIES:  is allergic to penicillins and sulfa antibiotics.  MEDICATIONS:  Current Outpatient Medications  Medication Sig Dispense Refill   aspirin EC 81 MG tablet Take 81 mg by mouth daily. Swallow whole.     carvedilol (COREG) 3.125 MG tablet Take 1 tablet (3.125 mg total) by mouth 2 (two) times daily. 180 tablet 3   furosemide (LASIX) 40 MG tablet Take 1 tablet (40 mg total) by mouth daily as needed (for weight gain of 2 pounds or more in 24 hours). 15 tablet 1   lactobacillus acidophilus (BACID) TABS tablet Take 1 tablet  by mouth daily.      Multiple Vitamin (MULTIVITAMIN) capsule Take 1 capsule by mouth daily.     OVER THE COUNTER MEDICATION Apply 1 application topically daily as needed (pain). CBD cream     potassium chloride SA (KLOR-CON) 20 MEQ tablet Take 1 tablet (20 mEq total) by mouth as needed. With every dose of lasix 90 tablet 3   rosuvastatin (CRESTOR) 20 MG tablet Take 1 tablet (20 mg total) by mouth daily. 90 tablet 3   sacubitril-valsartan (ENTRESTO) 24-26 MG Take 1 tablet by mouth 2 (two) times daily. 180 tablet 3   spironolactone (ALDACTONE) 25 MG tablet Take 0.5 tablets (12.5 mg total) by mouth  daily. 45 tablet 3   No current facility-administered medications for this visit.    REVIEW OF SYSTEMS:   Constitutional: Denies fevers, chills, abnormal night sweats, or unintentional weight loss Eyes: Denies blurriness of vision, double vision or watery eyes Ears, nose, mouth, throat, and face: Denies mucositis or sore throat Respiratory: Denies cough, dyspnea or wheezes Cardiovascular: Denies palpitation, chest discomfort or lower extremity swelling Gastrointestinal:  Denies nausea, vomiting, heartburn or change in bowel habits or pain Skin: (+) skin rash to face/forehead  Lymphatics: Denies new lymphadenopathy, bleeding, or easy bruising since stopping plavix Neurological:Denies numbness, tingling or new weaknesses Behavioral/Psych: Mood is stable, no new changes  All other systems were reviewed with the patient and are negative.  PHYSICAL EXAMINATION: ECOG PERFORMANCE STATUS: 0 - Asymptomatic  Vitals:   03/01/21 1101  BP: (!) 154/89  Pulse: 86  Resp: 18  Temp: 98.2 F (36.8 C)  SpO2: 98%   Filed Weights   03/01/21 1101  Weight: 164 lb 3.2 oz (74.5 kg)    GENERAL:alert, no distress and comfortable SKIN: mild facial rash to forehead EYES: sclera clear NECK: without mass LUNGS: clear with normal breathing effort HEART: regular rate & rhythm, no lower extremity  edema ABDOMEN:abdomen soft, non-tender and normal bowel sounds. No  palpable hepatosplenomegaly  PSYCH: alert & oriented x 3 with fluent speech NEURO: no focal motor/sensory deficits  LABORATORY DATA:  I have reviewed the data as listed CBC Latest Ref Rng & Units 03/01/2021 02/17/2021 08/09/2020  WBC 4.0 - 10.5 K/uL 5.1 5.2 5.5  Hemoglobin 13.0 - 17.0 g/dL 16.113.5 09.613.2 04.513.4  Hematocrit 39.0 - 52.0 % 38.5(L) 38.3 38.2  Platelets 150 - 400 K/uL 96(L) 103(L) 128(L)    CMP Latest Ref Rng & Units 02/17/2021 11/22/2020 08/09/2020  Glucose 65 - 99 mg/dL 409(W101(H) - 119(J109(H)  BUN 8 - 27 mg/dL 13 - 22  Creatinine 4.780.76 - 1.27 mg/dL 2.950.84 - 6.21(H0.67(L)  Sodium 134 - 144 mmol/L 139 - 142  Potassium 3.5 - 5.2 mmol/L 4.2 - 4.6  Chloride 96 - 106 mmol/L 104 - 104  CO2 20 - 29 mmol/L 23 - 24  Calcium 8.6 - 10.2 mg/dL 9.4 - 9.7  Total Protein 6.5 - 8.1 g/dL - - -  Total Bilirubin 0.3 - 1.2 mg/dL - - -  Alkaline Phos 38 - 126 U/L - - -  AST 15 - 41 U/L - - -  ALT 0 - 44 IU/L - 21 -     RADIOGRAPHIC STUDIES: I have personally reviewed the radiological images as listed and agreed with the findings in the report. No results found.  ASSESSMENT & PLAN: 75 yo male with   Thrombocytopenia -We reviewed his medical record in detail with the patient. He does not have outside/historical labs for comparison. It appears he had normal plt count 09/18/19 at presentation to ED for acute heart failure, and developed thrombocytopenia and anemia in the hospital. Lowest plt 46K.  -anemia and thrombocytopenia resolved after hospitalization, but he developed recurrent thrombocytopenia in 08/2020 and worsened lately.  -we reviewed common etiologies of thrombocytopenia, including nutritional deficiencies, alcohol induced, infectious, and/or autoimmune ITP. Given there are no other cytopenias, this is likely not primary bone marrow disease.  -he agrees to lab today (CBC, B12, folate, MMA, ANA) and abdominal US to r/o splenomegaly and  cirrhosis. If work up is negative, this is likely ITP. -repeat plt today 96K, he denies bleeding/bruising. He would not need treatment unless plt drops <50K or he  has bleeding. He would need plt checked prior to scheduled invasive procedures.  -we will call him with results from today's work up -lab, f/up in 3 months.  -Patient seen with Dr. Mosetta Putt.   HL, CHF s/p CABG -admitted to hospital 09/2019 with acute HF, he had not received routine medical care prior to that. Thrombocytopenia began during this admission  -on Entresto, coreg, lasix, aspirin 81 mg daily -recently stopped Crestor due to side effects, Dr. Anne Fu who is managing these conditions is aware  3. Health maintenance  -he quit smoking in 1974 after 1 PPD x6 years. He smokes marijuana rarely -he drinks moderate alcohol, 12 beers/wk but sometimes more. I encouraged him to reduce alcohol intake -we discussed alcohol use can contribute to thrombocytopenia by causing B12 deficiency, liver cirrhosis. He has not been checked for these. Agrees to lab and [place] work up -he has not done any cancer screenings, does not have PCP. -He prefers to wait until thrombocytopenia work up is done.  -father had prostate cancer and mother had melanoma. I strongly encouraged him to begin cancer screenings.    PLAN: -Medical record reviewed -Labs today - CBC, B12, folate, MMA, ANA -Abdominal US 8/31 to r/o splenomegaly and cirrhosis  -Will call with results -If work up is negative, likely ITP -Lab, f/up 3 months  -Cc note to referring provider Dr. Anne Fu   Orders Placed This Encounter  Procedures   US Abdomen Complete    Standing Status:   Future    Standing Expiration Date:   03/01/2022    Order Specific Question:   Reason for Exam (SYMPTOM  OR DIAGNOSIS REQUIRED)    Answer:   thrombocytopenia    Order Specific Question:   Preferred imaging location?    Answer:   [facility]   CBC with Differential (Cancer Center Only)    Standing  Status:   Future    Number of Occurrences:   1    Standing Expiration Date:   03/01/2022   ANA, IFA (with reflex)    Standing Status:   Future    Number of Occurrences:   1    Standing Expiration Date:   03/01/2022   Folate RBC    Standing Status:   Future    Number of Occurrences:   1    Standing Expiration Date:   03/01/2022   Vitamin B12    Standing Status:   Future    Number of Occurrences:   1    Standing Expiration Date:   03/01/2022   Immature Platelet Fraction    Standing Status:   Future    Number of Occurrences:   1    Standing Expiration Date:   03/01/2022   Methylmalonic acid, serum    Standing Status:   Future    Number of Occurrences:   1    Standing Expiration Date:   03/01/2022      All questions were answered. The patient knows to call the clinic with any problems, questions or concerns.     Pollyann Samples, NP 03/01/21 2:39 PM   Addendum  I have seen the patient, examined him. I agree with the assessment and and plan and have edited the notes.   75 yo male with PMH of CAD, s/p CABG in March 2021.  He had acute thrombocytopenia during that stay Feng resolved afterwards.  Now he has mild recurrent thrombocytopenia, no bleeding, WBC and RBC has been normal.  We reviewed the, etiology of  thrombocytopenia, and will check labs today, and ultrasound of the abdomen next few weeks. He does have history of moderate alcohol drinking, which could be his etiology of thrombocytopenia, and I strongly recommend him to quit.  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 Will call him after lab and [place].  All questions were answered.   Malachy Mood  03/01/2021

## 2021-03-01 ENCOUNTER — Inpatient Hospital Stay: Payer: Medicare Other | Attending: Nurse Practitioner | Admitting: Nurse Practitioner

## 2021-03-01 ENCOUNTER — Other Ambulatory Visit: Payer: Self-pay

## 2021-03-01 ENCOUNTER — Encounter: Payer: Self-pay | Admitting: Nurse Practitioner

## 2021-03-01 ENCOUNTER — Inpatient Hospital Stay: Payer: Medicare Other

## 2021-03-01 VITALS — BP 154/89 | HR 86 | Temp 98.2°F | Resp 18 | Wt 164.2 lb

## 2021-03-01 DIAGNOSIS — D696 Thrombocytopenia, unspecified: Secondary | ICD-10-CM | POA: Insufficient documentation

## 2021-03-01 DIAGNOSIS — Z79899 Other long term (current) drug therapy: Secondary | ICD-10-CM | POA: Insufficient documentation

## 2021-03-01 DIAGNOSIS — Z87891 Personal history of nicotine dependence: Secondary | ICD-10-CM | POA: Diagnosis not present

## 2021-03-01 DIAGNOSIS — Z8052 Family history of malignant neoplasm of bladder: Secondary | ICD-10-CM | POA: Insufficient documentation

## 2021-03-01 DIAGNOSIS — I509 Heart failure, unspecified: Secondary | ICD-10-CM | POA: Diagnosis not present

## 2021-03-01 DIAGNOSIS — Z951 Presence of aortocoronary bypass graft: Secondary | ICD-10-CM | POA: Diagnosis not present

## 2021-03-01 DIAGNOSIS — Z8042 Family history of malignant neoplasm of prostate: Secondary | ICD-10-CM | POA: Diagnosis not present

## 2021-03-01 DIAGNOSIS — E785 Hyperlipidemia, unspecified: Secondary | ICD-10-CM | POA: Insufficient documentation

## 2021-03-01 LAB — CBC WITH DIFFERENTIAL (CANCER CENTER ONLY)
Abs Immature Granulocytes: 0.02 10*3/uL (ref 0.00–0.07)
Basophils Absolute: 0 10*3/uL (ref 0.0–0.1)
Basophils Relative: 0 %
Eosinophils Absolute: 0.1 10*3/uL (ref 0.0–0.5)
Eosinophils Relative: 1 %
HCT: 38.5 % — ABNORMAL LOW (ref 39.0–52.0)
Hemoglobin: 13.5 g/dL (ref 13.0–17.0)
Immature Granulocytes: 0 %
Lymphocytes Relative: 29 %
Lymphs Abs: 1.5 10*3/uL (ref 0.7–4.0)
MCH: 31.6 pg (ref 26.0–34.0)
MCHC: 35.1 g/dL (ref 30.0–36.0)
MCV: 90.2 fL (ref 80.0–100.0)
Monocytes Absolute: 0.4 10*3/uL (ref 0.1–1.0)
Monocytes Relative: 7 %
Neutro Abs: 3.1 10*3/uL (ref 1.7–7.7)
Neutrophils Relative %: 63 %
Platelet Count: 96 10*3/uL — ABNORMAL LOW (ref 150–400)
RBC: 4.27 MIL/uL (ref 4.22–5.81)
RDW: 13.7 % (ref 11.5–15.5)
WBC Count: 5.1 10*3/uL (ref 4.0–10.5)
nRBC: 0 % (ref 0.0–0.2)

## 2021-03-01 LAB — VITAMIN B12: Vitamin B-12: 196 pg/mL (ref 180–914)

## 2021-03-01 LAB — IMMATURE PLATELET FRACTION: Immature Platelet Fraction: 1.9 % (ref 1.2–8.6)

## 2021-03-02 LAB — FOLATE RBC
Folate, Hemolysate: >620 ng/mL
Folate, RBC: >1498 ng/mL
Hematocrit: 41.4 % (ref 37.5–51.0)

## 2021-03-03 ENCOUNTER — Telehealth: Payer: Self-pay | Admitting: Nurse Practitioner

## 2021-03-03 LAB — METHYLMALONIC ACID, SERUM: Methylmalonic Acid, Quantitative: 192 nmol/L (ref 0–378)

## 2021-03-03 NOTE — Telephone Encounter (Signed)
Scheduled follow-up appointment per 8/24 los. Patient is aware. 

## 2021-03-08 ENCOUNTER — Ambulatory Visit (HOSPITAL_COMMUNITY)
Admission: RE | Admit: 2021-03-08 | Discharge: 2021-03-08 | Disposition: A | Discharge status: Home / Self Care | Payer: Medicare Other | Source: Ambulatory Visit | Attending: Nurse Practitioner | Admitting: Nurse Practitioner

## 2021-03-08 ENCOUNTER — Other Ambulatory Visit: Payer: Self-pay

## 2021-03-08 DIAGNOSIS — D696 Thrombocytopenia, unspecified: Secondary | ICD-10-CM | POA: Diagnosis not present

## 2021-03-08 DIAGNOSIS — I700 Atherosclerosis of aorta: Secondary | ICD-10-CM | POA: Diagnosis not present

## 2021-03-09 LAB — ANTINUCLEAR ANTIBODIES, IFA: ANA Ab, IFA: NEGATIVE

## 2021-03-24 ENCOUNTER — Other Ambulatory Visit: Payer: Self-pay | Admitting: Cardiology

## 2021-05-11 ENCOUNTER — Other Ambulatory Visit: Payer: Self-pay

## 2021-05-11 MED ORDER — ENTRESTO 24-26 MG PO TABS: 1.0000 | ORAL_TABLET | Freq: Two times a day (BID) | ORAL | 2 refills | Status: DC

## 2021-05-12 ENCOUNTER — Other Ambulatory Visit: Payer: Self-pay | Admitting: *Deleted

## 2021-05-28 ENCOUNTER — Other Ambulatory Visit: Payer: Self-pay | Admitting: Nurse Practitioner

## 2021-05-28 DIAGNOSIS — D696 Thrombocytopenia, unspecified: Secondary | ICD-10-CM

## 2021-05-28 NOTE — Progress Notes (Signed)
[facility]   Telephone:(336) [phone] Fax:(336) [phone]   Clinic Follow up Note   Patient Care Team: Patient, No Pcp Per (Inactive) as PCP - General (General Practice) Jake Bathe, MD as PCP - Cardiology (Cardiology) 05/31/2021  CHIEF COMPLAINT: Follow up thrombocytopenia   CURRENT THERAPY: Observation   INTERVAL HISTORY: Dennis Roberts returns for follow up as scheduled. Last seen as new patient 03/01/21. Work up showed normal ANA, B12, folic acid, MMA, immature plt fraction and no liver/spleen disease on [place]. This is likely ITP. He is doing well, denies changes in his health or new complaints. Denies bruising or bleeding. Denies recent or upcoming procedures. Denies acute illness. He no longer takes plavix, taking aspirin   MEDICAL HISTORY:  Past Medical History:  Diagnosis Date   CHF (congestive heart failure) (HCC)    Coronary artery disease     SURGICAL HISTORY: Past Surgical History:  Procedure Laterality Date   ARTERIAL LINE INSERTION N/A 09/18/2019   Procedure: ARTERIAL LINE INSERTION;  Surgeon: Dolores Patty, MD;  Location: [facility] INVASIVE CV LAB;  Service: Cardiovascular;  Laterality: N/A;   CARDIAC CATHETERIZATION     CORONARY ARTERY BYPASS GRAFT N/A 09/19/2019   Procedure: CORONARY ARTERY BYPASS GRAFTING (CABG) using LIMA to LAD; Endoscopic harvest right saphenous vein: SVG tp Diag; SVG sequential to OM1 and OM2.;  Surgeon: Linden Dolin, MD;  Location: [facility] OR;  Service: Open Heart Surgery;  Laterality: N/A;   ENDOVEIN HARVEST OF GREATER SAPHENOUS VEIN Right 09/19/2019   Procedure: Mack Guise Of Greater Saphenous Vein;  Surgeon: Linden Dolin, MD;  Location: [facility] OR;  Service: Open Heart Surgery;  Laterality: Right;   IR THORACENTESIS ASP PLEURAL SPACE W/IMG GUIDE  10/16/2019   NOSE SURGERY     PATENT DUCTUS ARTERIOUS REPAIR N/A 09/19/2019   Procedure: CLOSURE OF PATENT DUCTUS ARTERIOSUS;  Surgeon: Linden Dolin, MD;  Location: [facility] OR;   Service: Open Heart Surgery;  Laterality: N/A;   RIGHT/LEFT HEART CATH AND CORONARY ANGIOGRAPHY N/A 09/18/2019   Procedure: RIGHT/LEFT HEART CATH AND CORONARY ANGIOGRAPHY;  Surgeon: Dolores Patty, MD;  [facility]: [facility]AS[facility] LAB;  Service: Cardiovascular;  Laterality: N/A;   TEE WITHOUT CARDIOVERSION N/A 09/19/2019   Procedure: TRANSESOPHAGEAL ECHOCARDIOGRAM (TEE);  Surgeon: Linden Dolin, MD;  Location: [facility] OR;  Service: Open Heart Surgery;  Laterality: N/A;   VENTRICULAR ASSIST DEVICE INSERTION N/A 09/18/2019   Procedure: VENTRICULAR ASSIST DEVICE INSERTION;  Surgeon: Dolores Patty, MD;  Location: [facility] INVASIVE CV LAB;  Service: Cardiovascular;  Laterality: N/A;    I have reviewed the social history and family history with the patient and they are unchanged from previous note.  ALLERGIES:  is allergic to penicillins and sulfa antibiotics.  MEDICATIONS:  Current Outpatient Medications  Medication Sig Dispense Refill   aspirin EC 81 MG tablet Take 81 mg by mouth daily. Swallow whole.     carvedilol (COREG) 3.125 MG tablet Take 1 tablet (3.125 mg total) by mouth 2 (two) times daily. 180 tablet 3   rosuvastatin (CRESTOR) 20 MG tablet Take 1 tablet (20 mg total) by mouth daily. 90 tablet 3   sacubitril-valsartan (ENTRESTO) 24-26 MG Take 1 tablet by mouth 2 (two) times daily. 180 tablet 2   spironolactone (ALDACTONE) 25 MG tablet TAKE ONE-HALF TABLET BY  MOUTH DAILY 45 tablet 3   furosemide (LASIX) 40 MG tablet Take 1 tablet (40 mg total) by mouth daily as needed (for weight gain of 2 pounds  or more in 24 hours). 15 tablet 1   lactobacillus acidophilus (BACID) TABS tablet Take 1 tablet by mouth daily.      Multiple Vitamin (MULTIVITAMIN) capsule Take 1 capsule by mouth daily.     OVER THE COUNTER MEDICATION Apply 1 application topically daily as needed (pain). CBD cream     potassium chloride SA (KLOR-CON) 20 MEQ tablet Take 1 tablet (20 mEq total) by mouth as needed. With every dose  of lasix 90 tablet 3   No current facility-administered medications for this visit.    PHYSICAL EXAMINATION: ECOG PERFORMANCE STATUS: 0 - Asymptomatic  Vitals:   05/31/21 1007  BP: 132/76  Pulse: 82  Resp: 17  Temp: (!) 97.2 F (36.2 C)  SpO2: 100%   Filed Weights   05/31/21 1007  Weight: 164 lb 1 oz (74.4 kg)    GENERAL:alert, no distress and comfortable SKIN: hypopigmentation to forehead EYES: sclera clear LUNGS: clear with normal breathing effort HEART: regular rate & rhythm, no lower extremity edema NEURO: alert & oriented x 3 with fluent speech  LABORATORY DATA:  I have reviewed the data as listed CBC Latest Ref Rng & Units 05/31/2021 03/01/2021 03/01/2021  WBC 4.0 - 10.5 K/uL 4.5 5.1 -  Hemoglobin 13.0 - 17.0 g/dL 13.1 13.5 -  Hematocrit 39.0 - 52.0 % 37.4(L) 41.4 38.5(L)  Platelets 150 - 400 K/uL 90(L) 96(L) -     CMP Latest Ref Rng & Units 02/17/2021 11/22/2020 08/09/2020  Glucose 65 - 99 mg/dL 101(H) - 109(H)  BUN 8 - 27 mg/dL 13 - 22  Creatinine 0.76 - 1.27 mg/dL 0.84 - 0.67(L)  Sodium 134 - 144 mmol/L 139 - 142  Potassium 3.5 - 5.2 mmol/L 4.2 - 4.6  Chloride 96 - 106 mmol/L 104 - 104  CO2 20 - 29 mmol/L 23 - 24  Calcium 8.6 - 10.2 mg/dL 9.4 - 9.7  Total Protein 6.5 - 8.1 g/dL - - -  Total Bilirubin 0.3 - 1.2 mg/dL - - -  Alkaline Phos 38 - 126 U/L - - -  AST 15 - 41 U/L - - -  ALT 0 - 44 IU/L - 21 -      RADIOGRAPHIC STUDIES: I have personally reviewed the radiological images as listed and agreed with the findings in the report. No results found.   ASSESSMENT & PLAN: 75 yo male with    Thrombocytopenia -initially seen 02/2021, he does not have historical labs to compare, but appears he had normal plt count 09/18/19 at presentation to ED for acute heart failure, and developed thrombocytopenia and anemia in the hospital. Lowest plt 46K.  -anemia and thrombocytopenia resolved after hospitalization, but he developed recurrent thrombocytopenia in 08/2020  and worsened lately.  -work up showed normal B12, MMA, folate, immature plt count, and no liver/spleen disease on abdominal US. This is likely ITP -does not require treatment unless plt <50K and/or bleeding  -we reviewed plt can drop during acute illness. He will call if he has planned procedures   HL, CHF s/p CABG -admitted to hospital 09/2019 with acute HF, he had not received routine medical care prior to that. Thrombocytopenia began during this admission  -on Entresto, coreg, aspirin 81 mg daily   3. Health maintenance  -he quit smoking in 1974 after 1 PPD x6 years. He smokes marijuana rarely -he drinks moderate alcohol, 12 beers/wk but sometimes more. I encouraged him to reduce alcohol intake -B12 in low-normal range. I recommend to start oral B12. -father  had prostate cancer and mother had melanoma. I strongly encouraged him to begin cancer screenings.     Disposition:  Dennis Roberts is doing well without evidence of bleeding. I reviewed his previous thrombocytopenia work up which showed normal immature plt count, B12 (low-normal), folate, MMA, and abdominal US. This is likely ITP. Plt today is 90K. I recommend to start oral B12 but otherwise does not need treatment such as plt growth factor. He understands this condition should be monitored. He knows to call if he has bruising/bleeding, acute illness, or planned procedures. Lab in 3 and 6 months, f/up in 6 months.   He does not have PCP, I offered to refer him. He will call his insurance to see who is in network then self refer. He continues to f/up with cardiology Dr. Marlou Porch.   He plans to get covid booster, he declined flu vac today.  All questions were answered. The patient knows to call the clinic with any problems, questions or concerns. No barriers to learning were detected.     Alla Feeling, NP 05/31/21

## 2021-05-31 ENCOUNTER — Encounter: Payer: Self-pay | Admitting: Nurse Practitioner

## 2021-05-31 ENCOUNTER — Inpatient Hospital Stay: Payer: Medicare Other | Attending: Nurse Practitioner | Admitting: Nurse Practitioner

## 2021-05-31 ENCOUNTER — Inpatient Hospital Stay: Payer: Medicare Other

## 2021-05-31 ENCOUNTER — Other Ambulatory Visit: Payer: Self-pay

## 2021-05-31 ENCOUNTER — Telehealth: Payer: Self-pay

## 2021-05-31 VITALS — BP 132/76 | HR 82 | Temp 97.2°F | Resp 17 | Wt 164.1 lb

## 2021-05-31 DIAGNOSIS — Z87891 Personal history of nicotine dependence: Secondary | ICD-10-CM | POA: Insufficient documentation

## 2021-05-31 DIAGNOSIS — F1090 Alcohol use, unspecified, uncomplicated: Secondary | ICD-10-CM | POA: Insufficient documentation

## 2021-05-31 DIAGNOSIS — Z8042 Family history of malignant neoplasm of prostate: Secondary | ICD-10-CM | POA: Diagnosis not present

## 2021-05-31 DIAGNOSIS — D696 Thrombocytopenia, unspecified: Secondary | ICD-10-CM | POA: Insufficient documentation

## 2021-05-31 DIAGNOSIS — I509 Heart failure, unspecified: Secondary | ICD-10-CM | POA: Insufficient documentation

## 2021-05-31 DIAGNOSIS — Z79899 Other long term (current) drug therapy: Secondary | ICD-10-CM | POA: Diagnosis not present

## 2021-05-31 DIAGNOSIS — Z7982 Long term (current) use of aspirin: Secondary | ICD-10-CM | POA: Insufficient documentation

## 2021-05-31 DIAGNOSIS — Z951 Presence of aortocoronary bypass graft: Secondary | ICD-10-CM | POA: Diagnosis not present

## 2021-05-31 DIAGNOSIS — E785 Hyperlipidemia, unspecified: Secondary | ICD-10-CM | POA: Diagnosis not present

## 2021-05-31 LAB — CBC WITH DIFFERENTIAL (CANCER CENTER ONLY)
Abs Immature Granulocytes: 0.01 10*3/uL (ref 0.00–0.07)
Basophils Absolute: 0 10*3/uL (ref 0.0–0.1)
Basophils Relative: 0 %
Eosinophils Absolute: 0.1 10*3/uL (ref 0.0–0.5)
Eosinophils Relative: 2 %
HCT: 37.4 % — ABNORMAL LOW (ref 39.0–52.0)
Hemoglobin: 13.1 g/dL (ref 13.0–17.0)
Immature Granulocytes: 0 %
Lymphocytes Relative: 35 %
Lymphs Abs: 1.6 10*3/uL (ref 0.7–4.0)
MCH: 31.9 pg (ref 26.0–34.0)
MCHC: 35 g/dL (ref 30.0–36.0)
MCV: 91 fL (ref 80.0–100.0)
Monocytes Absolute: 0.3 10*3/uL (ref 0.1–1.0)
Monocytes Relative: 7 %
Neutro Abs: 2.5 10*3/uL (ref 1.7–7.7)
Neutrophils Relative %: 56 %
Platelet Count: 90 10*3/uL — ABNORMAL LOW (ref 150–400)
RBC: 4.11 MIL/uL — ABNORMAL LOW (ref 4.22–5.81)
RDW: 13.5 % (ref 11.5–15.5)
WBC Count: 4.5 10*3/uL (ref 4.0–10.5)
nRBC: 0 % (ref 0.0–0.2)

## 2021-05-31 NOTE — Telephone Encounter (Signed)
This nurse reached out to patient related to below lab results and provider recommendations.  Left a message for patient to return call to the clinic. No further concerns at this time.

## 2021-05-31 NOTE — Telephone Encounter (Signed)
-----   Message from Pollyann Samples, NP sent at 05/31/2021 10:48 AM EST ----- We reviewed his CBC in today's visit. I forgot to let him know I recommend to start oral B12 pill once daily. His B12 was low-normal range on his initial work up. Please let him know. Thanks, Clayborn Heron, NP

## 2021-06-07 ENCOUNTER — Telehealth: Payer: Self-pay | Admitting: Cardiology

## 2021-06-07 NOTE — Telephone Encounter (Signed)
Left a message for the pt to call back.  

## 2021-06-07 NOTE — Telephone Encounter (Signed)
Patient needs Dr. Minerva Fester advice on what to do for his patient assistance form, He has gotten the medication from program for the past two years. Please advise

## 2021-06-08 NOTE — Telephone Encounter (Signed)
Left message for pt to call back.  Advised he will need to complete his portion of the form, provide income information as required and sign the form.  He will then need to bring it to the office in order for [place] to complete the MDs portion and fax into [employer].

## 2021-06-14 NOTE — Telephone Encounter (Signed)
**Note De-Identified Dennis Roberts Obfuscation** The pt left his completed [employer] Pt asst application for Entresto at the office with documents.  I have completed the providers page of his application and have emailed all to Dr Anne Fu nurse so she can print a [employer] cabin crew for #[id number] with 3 refills, obtain Dr Anne Fu signature on [employer] and application, date it , and to fax all to [employer] at the fax number written on the cover letter included or to place in the to be faxed basket in medical Records to be faxed

## 2021-06-15 NOTE — Telephone Encounter (Signed)
Email received and printed.  To be given to Dr Anne Fu to sign on Monday when he is back in the office.

## 2021-06-19 NOTE — Telephone Encounter (Signed)
[employer] pt assist paperwork signed by Dr Anne Fu and taken to be faxed.

## 2021-06-19 NOTE — Telephone Encounter (Signed)
Paperwork faxed and received confirmation transmission was successful.

## 2021-06-26 ENCOUNTER — Other Ambulatory Visit: Payer: Self-pay | Admitting: *Deleted

## 2021-06-26 MED ORDER — ENTRESTO 24-26 MG PO TABS: 1.0000 | ORAL_TABLET | Freq: Two times a day (BID) | ORAL | 3 refills | Status: DC

## 2021-06-26 NOTE — Progress Notes (Signed)
Rx for pt assistance program

## 2021-07-18 NOTE — Telephone Encounter (Signed)
**Note De-Identified Brandin Dilday Obfuscation** Letter received Dennis Roberts fax from [employer] Pt Asst [employer] stating that they have approved the pt for asst with Entresto until 07/08/2022. Pt ID: [id number]  The letter states that they have notified the pt of this approval as well.

## 2021-08-11 ENCOUNTER — Other Ambulatory Visit: Payer: Self-pay | Admitting: Cardiology

## 2021-08-31 ENCOUNTER — Other Ambulatory Visit: Payer: Self-pay

## 2021-08-31 ENCOUNTER — Inpatient Hospital Stay: Payer: Medicare Other | Attending: Nurse Practitioner

## 2021-08-31 DIAGNOSIS — D696 Thrombocytopenia, unspecified: Secondary | ICD-10-CM | POA: Insufficient documentation

## 2021-08-31 LAB — CBC WITH DIFFERENTIAL (CANCER CENTER ONLY)
Abs Immature Granulocytes: 0.06 10*3/uL (ref 0.00–0.07)
Basophils Absolute: 0 10*3/uL (ref 0.0–0.1)
Basophils Relative: 1 %
Eosinophils Absolute: 0.1 10*3/uL (ref 0.0–0.5)
Eosinophils Relative: 1 %
HCT: 37.6 % — ABNORMAL LOW (ref 39.0–52.0)
Hemoglobin: 12.8 g/dL — ABNORMAL LOW (ref 13.0–17.0)
Immature Granulocytes: 1 %
Lymphocytes Relative: 38 %
Lymphs Abs: 2 10*3/uL (ref 0.7–4.0)
MCH: 31.4 pg (ref 26.0–34.0)
MCHC: 34 g/dL (ref 30.0–36.0)
MCV: 92.2 fL (ref 80.0–100.0)
Monocytes Absolute: 0.4 10*3/uL (ref 0.1–1.0)
Monocytes Relative: 7 %
Neutro Abs: 2.8 10*3/uL (ref 1.7–7.7)
Neutrophils Relative %: 52 %
Platelet Count: 95 10*3/uL — ABNORMAL LOW (ref 150–400)
RBC: 4.08 MIL/uL — ABNORMAL LOW (ref 4.22–5.81)
RDW: 13.7 % (ref 11.5–15.5)
WBC Count: 5.3 10*3/uL (ref 4.0–10.5)
nRBC: 0 % (ref 0.0–0.2)

## 2021-11-26 NOTE — Progress Notes (Unsigned)
[place]   Telephone:(336) [phone] Fax:(336) [phone]   Clinic Follow up Note   Patient Care Team: Patient, No Pcp Per (Inactive) as PCP - General ([place]) Jerline Pain, MD as PCP - Cardiology (Cardiology) 11/28/2021  CHIEF COMPLAINT: Follow up thrombocytopenia, likely ITP  CURRENT THERAPY: Observation  INTERVAL HISTORY: Dennis Roberts returns for follow up as scheduled. Last seen by me 05/31/21.  He had a crown fixed at the end of last year.  Denies new or increased bruising/bleeding, he takes baby aspirin for heart health.  Denies recent infection or upcoming procedures.  He is looking into a new PCP at [facility].  All other systems were reviewed with the patient and are negative.  MEDICAL HISTORY:  Past Medical History:  Diagnosis Date   CHF (congestive heart failure) ([place])    Coronary artery disease     SURGICAL HISTORY: Past Surgical History:  Procedure Laterality Date   ARTERIAL LINE INSERTION N/A 09/18/2019   Procedure: ARTERIAL LINE INSERTION;  Surgeon: Jolaine Artist, MD;  Location: [place] [facility];  Service: Cardiovascular;  Laterality: N/A;   CARDIAC CATHETERIZATION     CORONARY ARTERY BYPASS GRAFT N/A 09/19/2019   Procedure: CORONARY ARTERY BYPASS GRAFTING (CABG) using LIMA to LAD; Endoscopic harvest right saphenous vein: SVG tp Diag; SVG sequential to OM1 and OM2.;  Surgeon: Wonda Olds, MD;  Location: [place];  Service: Open Heart Surgery;  Laterality: N/A;   ENDOVEIN HARVEST OF GREATER SAPHENOUS VEIN Right 09/19/2019   Procedure: [place] Of Greater Saphenous Vein;  Surgeon: Wonda Olds, MD;  Location: [facility];  Service: Open Heart Surgery;  Laterality: Right;   IR THORACENTESIS ASP PLEURAL SPACE W/IMG GUIDE  10/16/2019   NOSE SURGERY     PATENT DUCTUS ARTERIOUS REPAIR N/A 09/19/2019   Procedure: CLOSURE OF PATENT DUCTUS ARTERIOSUS;  Surgeon: Wonda Olds, MD;  Location: [place];  Service: Open Heart Surgery;   Laterality: N/A;   RIGHT/LEFT HEART CATH AND CORONARY ANGIOGRAPHY N/A 09/18/2019   Procedure: RIGHT/LEFT HEART CATH AND CORONARY ANGIOGRAPHY;  Surgeon: Jolaine Artist, MD;  Location: [place] [facility];  Service: Cardiovascular;  Laterality: N/A;   TEE WITHOUT CARDIOVERSION N/A 09/19/2019   Procedure: TRANSESOPHAGEAL ECHOCARDIOGRAM (TEE);  Surgeon: Wonda Olds, MD;  Location: [place];  Service: Open Heart Surgery;  Laterality: N/A;   VENTRICULAR ASSIST DEVICE INSERTION N/A 09/18/2019   Procedure: VENTRICULAR ASSIST DEVICE INSERTION;  Surgeon: Jolaine Artist, MD;  Location: [place] [facility];  Service: Cardiovascular;  Laterality: N/A;    I have reviewed the social history and family history with the patient and they are unchanged from previous note.  ALLERGIES:  is allergic to penicillins and sulfa antibiotics.  MEDICATIONS:  Current Outpatient Medications  Medication Sig Dispense Refill   aspirin EC 81 MG tablet Take 81 mg by mouth daily. Swallow whole.     carvedilol (COREG) 3.125 MG tablet Take 1 tablet (3.125 mg total) by mouth 2 (two) times daily. 180 tablet 3   furosemide (LASIX) 40 MG tablet Take 1 tablet (40 mg total) by mouth daily as needed (for weight gain of 2 pounds or more in 24 hours). 15 tablet 1   lactobacillus acidophilus (BACID) TABS tablet Take 1 tablet by mouth daily.      Multiple Vitamin (MULTIVITAMIN) capsule Take 1 capsule by mouth daily.     OVER THE COUNTER MEDICATION Apply 1 application topically daily as needed (pain). CBD cream  potassium chloride SA (KLOR-CON) 20 MEQ tablet Take 1 tablet (20 mEq total) by mouth as needed. With every dose of lasix 90 tablet 3   rosuvastatin (CRESTOR) 20 MG tablet TAKE 1 TABLET BY MOUTH  DAILY 90 tablet 2   sacubitril-valsartan (ENTRESTO) 24-26 MG Take 1 tablet by mouth 2 (two) times daily. 180 tablet 3   spironolactone (ALDACTONE) 25 MG tablet TAKE ONE-HALF TABLET BY  MOUTH DAILY 45 tablet 3   No current  facility-administered medications for this visit.    PHYSICAL EXAMINATION: ECOG PERFORMANCE STATUS: 0 - Asymptomatic  Vitals:   11/28/21 1030  BP: 130/79  Pulse: 73  Resp: 17  Temp: 97.8 F (36.6 C)   Filed Weights   11/28/21 1030  Weight: 165 lb 4.8 oz (75 kg)    GENERAL:alert, no distress and comfortable SKIN: No obvious ecchymosis or petechiae EYES: sclera clear NECK: Without mass LUNGS: clear with normal breathing effort HEART: regular rate & rhythm, no lower extremity edema ABDOMEN:abdomen soft, non-tender and normal bowel sounds NEURO: alert & oriented x 3 with fluent speech, no focal motor/sensory deficits  LABORATORY DATA:  I have reviewed the data as listed    Latest Ref Rng & Units 11/28/2021   10:03 AM 08/31/2021   10:06 AM 05/31/2021    9:54 AM  CBC  WBC 4.0 - 10.5 K/uL 5.6   5.3   4.5    Hemoglobin 13.0 - 17.0 g/dL 13.2   12.8   13.1    Hematocrit 39.0 - 52.0 % 38.7   37.6   37.4    Platelets 150 - 400 K/uL 92   95   90          Latest Ref Rng & Units 02/17/2021    9:11 AM 11/22/2020    8:34 AM 08/09/2020    2:22 PM  CMP  Glucose 65 - 99 mg/dL 101    109    BUN 8 - 27 mg/dL 13    22    Creatinine 0.76 - 1.27 mg/dL 0.84    0.67    Sodium 134 - 144 mmol/L 139    142    Potassium 3.5 - 5.2 mmol/L 4.2    4.6    Chloride 96 - 106 mmol/L 104    104    CO2 20 - 29 mmol/L 23    24    Calcium 8.6 - 10.2 mg/dL 9.4    9.7    ALT 0 - 44 IU/L  21         RADIOGRAPHIC STUDIES: I have personally reviewed the radiological images as listed and agreed with the findings in the report. No results found.   ASSESSMENT & PLAN: 76 yo male with    Thrombocytopenia -initially seen 02/2021, he does not have historical labs to compare, but appears he had normal plt count 09/18/19 at presentation to ED for acute heart failure, and developed thrombocytopenia and anemia in the hospital. Lowest plt 46K.  -anemia and thrombocytopenia resolved after hospitalization, but he  developed recurrent and worsened thrombocytopenia in 08/2020 -work up showed normal B12, MMA, folate, immature plt count, and no liver/spleen disease on abdominal US. This is likely ITP -does not require treatment unless plt <50K and/or severe bleeding  -we reviewed plt can drop during acute illness. He will call if he has planned procedures   HL, CHF s/p CABG -admitted to hospital 09/2019 with acute HF, he had not received routine medical care prior to that.  Thrombocytopenia began during this admission  -on Entresto, coreg, aspirin 81 mg daily.  Okay to continue aspirin unless platelet below 50K   3. Health maintenance  -he quit smoking in 1974 after 1 PPD x6 years. He smokes marijuana rarely -he drinks moderate alcohol, 12 beers/wk but sometimes more. I previously encouraged him to reduce alcohol intake -B12 in low-normal range. I previously recommend to start oral B12. -father had prostate cancer and mother had melanoma. I strongly encouraged him to begin cancer screenings but he has not done so -he declined PCP referral, he will look into it on his own.   Disposition: Dennis Roberts appears stable.  No signs of bruising or active bleeding.  Platelet count stable at 92K.  No other cytopenias.  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   I recommend to continue observation with lab in 3 and 6 months, and follow-up in 6 months.  He knows to call sooner if he develops bruising/bleeding, acute infection, or has planned dental or surgical procedure coming up.  We discussed age-appropriate health maintenance, he plans to call a new PCP recommended by his son.  He continues routine cardiology follow-up.  All questions were answered. The patient knows to call the clinic with any problems, questions or concerns. No barriers to learning were detected.     Alla Feeling, NP 11/28/21

## 2021-11-28 ENCOUNTER — Encounter: Payer: Self-pay | Admitting: Nurse Practitioner

## 2021-11-28 ENCOUNTER — Inpatient Hospital Stay: Payer: Medicare Other | Attending: Nurse Practitioner

## 2021-11-28 ENCOUNTER — Inpatient Hospital Stay (HOSPITAL_BASED_OUTPATIENT_CLINIC_OR_DEPARTMENT_OTHER): Payer: Medicare Other | Admitting: Nurse Practitioner

## 2021-11-28 VITALS — BP 130/79 | HR 73 | Temp 97.8°F | Resp 17 | Ht 65.0 in | Wt 165.3 lb

## 2021-11-28 DIAGNOSIS — D696 Thrombocytopenia, unspecified: Secondary | ICD-10-CM | POA: Insufficient documentation

## 2021-11-28 DIAGNOSIS — Z87891 Personal history of nicotine dependence: Secondary | ICD-10-CM | POA: Insufficient documentation

## 2021-11-28 DIAGNOSIS — I2510 Atherosclerotic heart disease of native coronary artery without angina pectoris: Secondary | ICD-10-CM | POA: Insufficient documentation

## 2021-11-28 DIAGNOSIS — Z79899 Other long term (current) drug therapy: Secondary | ICD-10-CM | POA: Insufficient documentation

## 2021-11-28 DIAGNOSIS — Z951 Presence of aortocoronary bypass graft: Secondary | ICD-10-CM | POA: Diagnosis not present

## 2021-11-28 DIAGNOSIS — Z7982 Long term (current) use of aspirin: Secondary | ICD-10-CM | POA: Insufficient documentation

## 2021-11-28 LAB — CBC WITH DIFFERENTIAL (CANCER CENTER ONLY)
Abs Immature Granulocytes: 0.02 10*3/uL (ref 0.00–0.07)
Basophils Absolute: 0 10*3/uL (ref 0.0–0.1)
Basophils Relative: 1 %
Eosinophils Absolute: 0.1 10*3/uL (ref 0.0–0.5)
Eosinophils Relative: 2 %
HCT: 38.7 % — ABNORMAL LOW (ref 39.0–52.0)
Hemoglobin: 13.2 g/dL (ref 13.0–17.0)
Immature Granulocytes: 0 %
Lymphocytes Relative: 34 %
Lymphs Abs: 1.9 10*3/uL (ref 0.7–4.0)
MCH: 31.7 pg (ref 26.0–34.0)
MCHC: 34.1 g/dL (ref 30.0–36.0)
MCV: 92.8 fL (ref 80.0–100.0)
Monocytes Absolute: 0.4 10*3/uL (ref 0.1–1.0)
Monocytes Relative: 8 %
Neutro Abs: 3.2 10*3/uL (ref 1.7–7.7)
Neutrophils Relative %: 55 %
Platelet Count: 92 10*3/uL — ABNORMAL LOW (ref 150–400)
RBC: 4.17 MIL/uL — ABNORMAL LOW (ref 4.22–5.81)
RDW: 13.6 % (ref 11.5–15.5)
WBC Count: 5.6 10*3/uL (ref 4.0–10.5)
nRBC: 0 % (ref 0.0–0.2)

## 2021-11-30 ENCOUNTER — Other Ambulatory Visit: Payer: Self-pay | Admitting: Cardiology

## 2021-12-30 IMAGING — DX DG CHEST 1V PORT
1 series · 1 of 1 positions shown · non-contrast
Comparison: 09/21/2019

CLINICAL DATA: Prior CABG.

EXAM:
PORTABLE CHEST 1 VIEW

[chest ap]
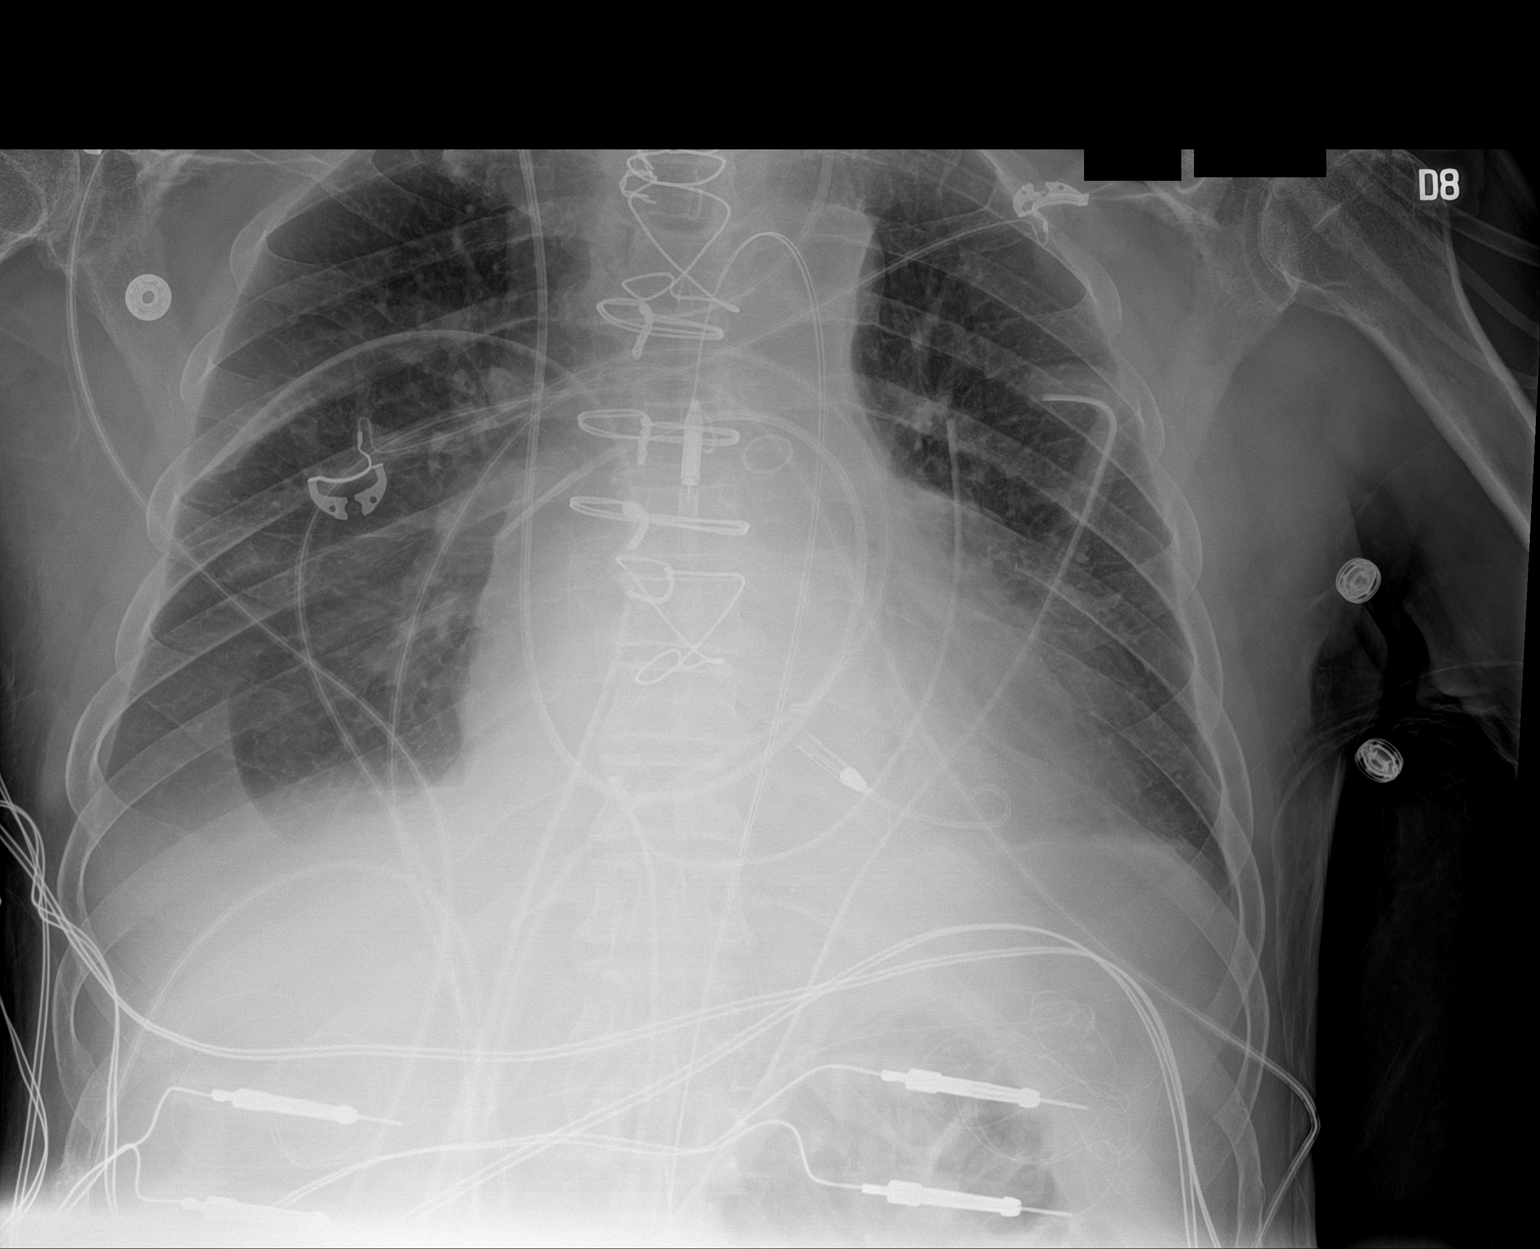

[1 of 1 positions shown; findings below may reference images not displayed]

FINDINGS: Support devices including bilateral chest tubes remain in place,
unchanged. Heart is borderline in size. Improved aeration in the
lung bases. Small layering right pleural effusion. Bibasilar
atelectasis. No overt edema. Mild vascular congestion.
IMPRESSION: Improving aeration in the lung bases. Mild residual vascular
congestion, right effusion and bibasilar atelectasis.

## 2022-01-05 IMAGING — CR DG CHEST 2V
2 series · 2 of 2 positions shown · non-contrast
Comparison: Radiograph 09/24/2019

CLINICAL DATA: Postop CABG

EXAM:
CHEST - 2 VIEW

[chest pa]
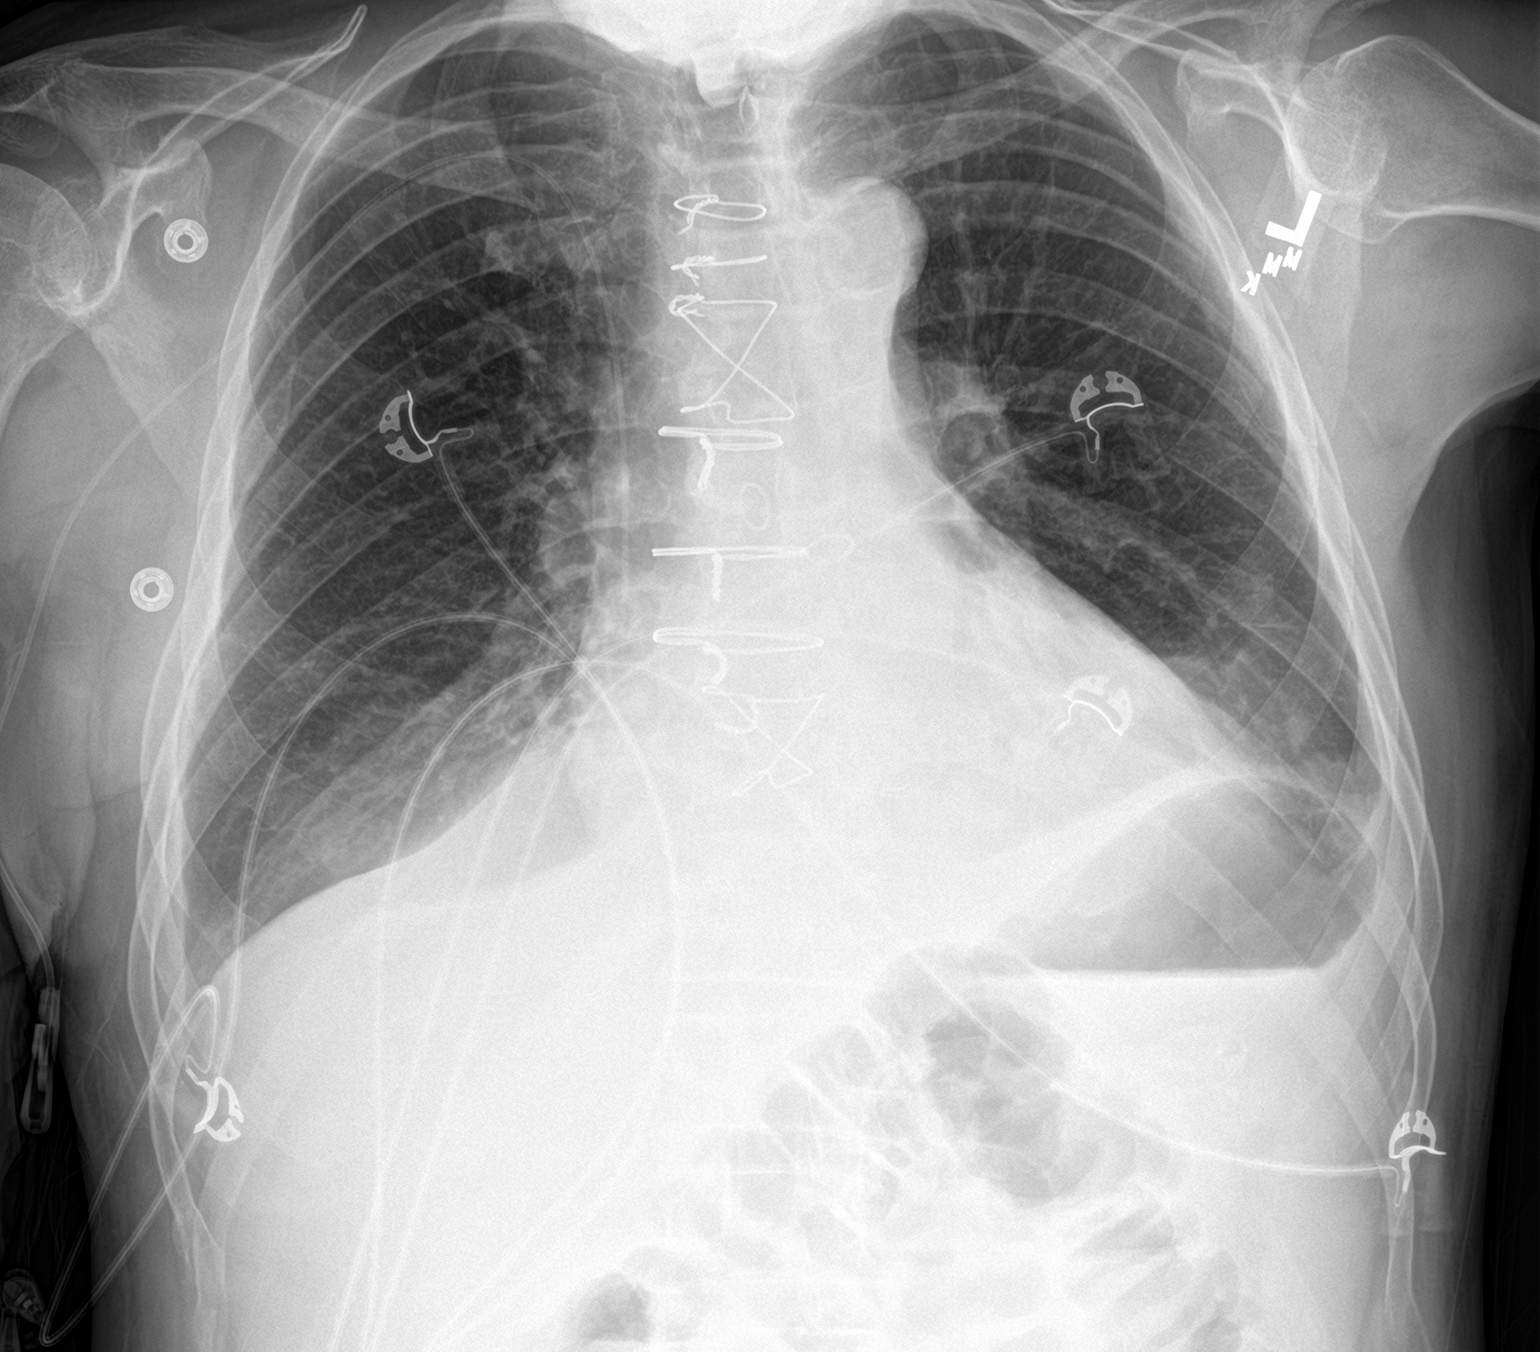

[chest lat]
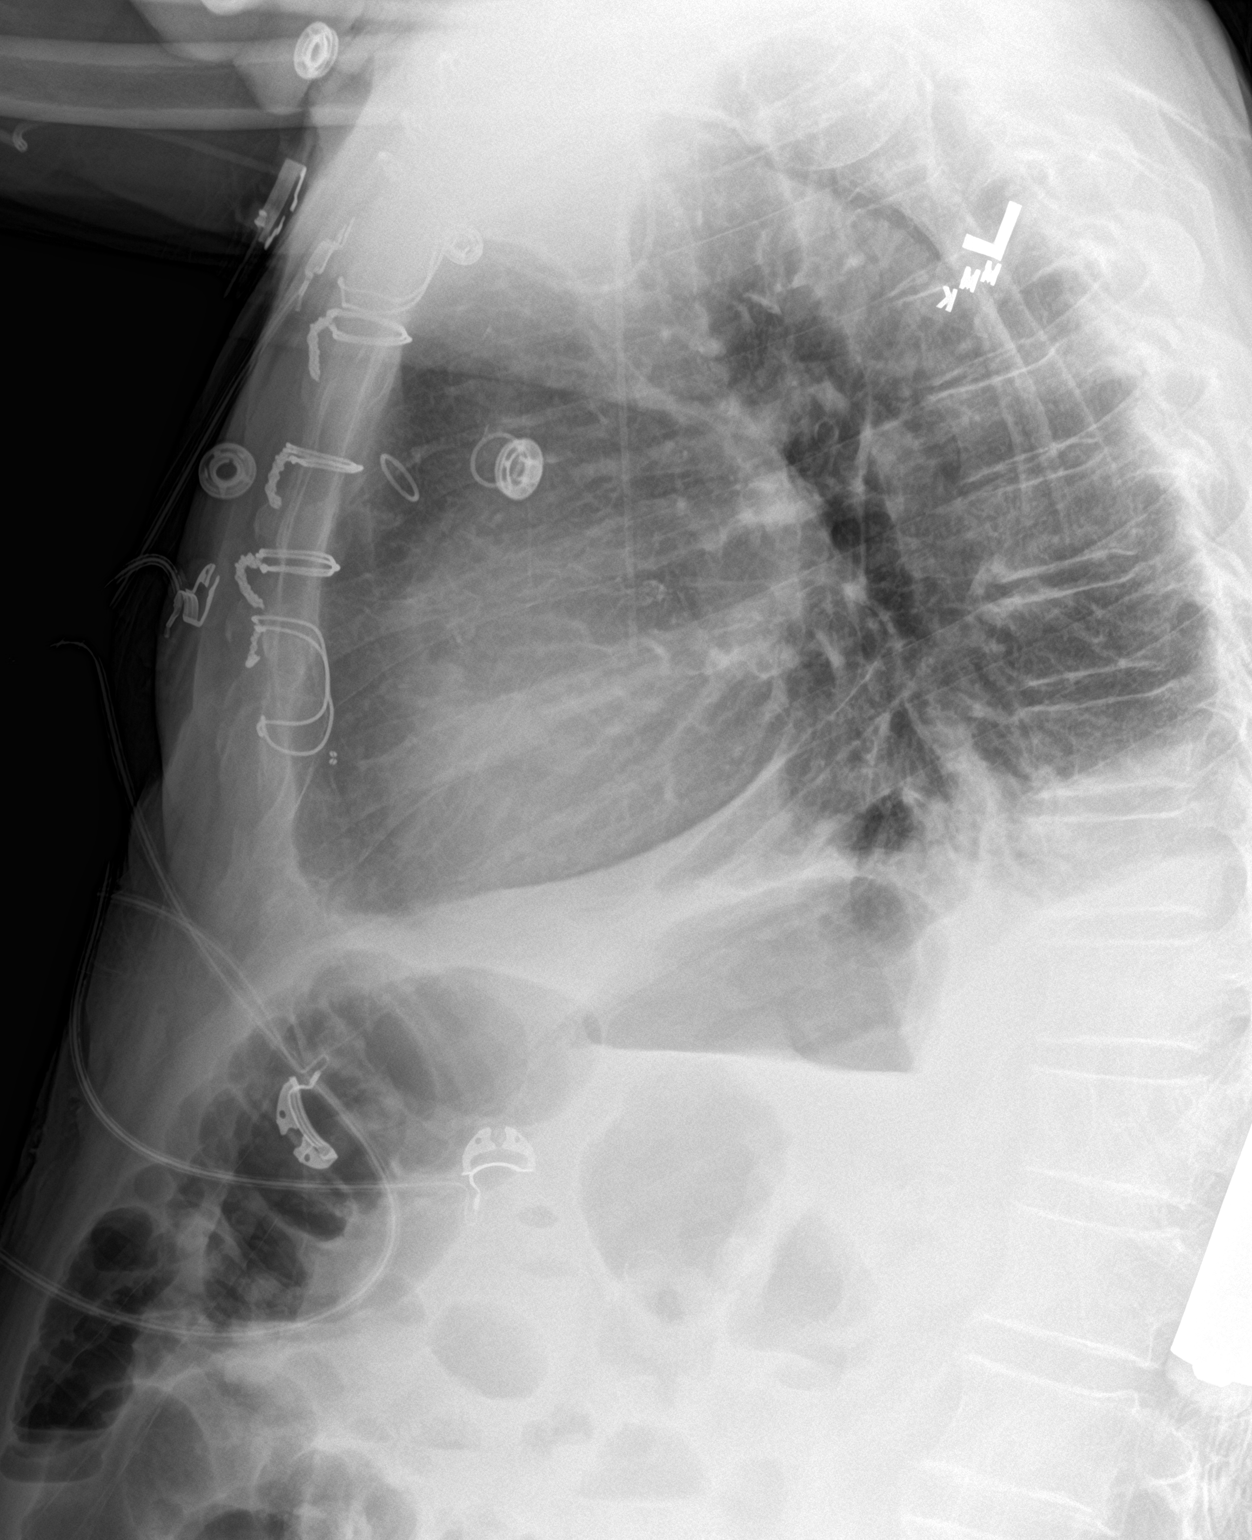

[2 of 2 positions shown; findings below may reference images not displayed]

FINDINGS: Retraction of Swan-Ganz catheter and Impella device. Removal of
chest tube. PICC line remains.

No pneumothorax. Improvement in RIGHT effusion. Persistent bibasilar
atelectasis and LEFT effusion.
IMPRESSION: 1. Removal of chest tubes.  No pneumothorax
2. Improvement RIGHT effusion.
3. Persistent small LEFT effusion and basilar atelectasis.

## 2022-01-11 ENCOUNTER — Other Ambulatory Visit: Payer: Self-pay | Admitting: Cardiology

## 2022-02-02 IMAGING — CR DG CHEST 2V
2 series · 2 of 2 positions shown · non-contrast
Comparison: 10/16/2019

CLINICAL DATA: Status post CABG.

EXAM:
CHEST - 2 VIEW

[w chest pa]
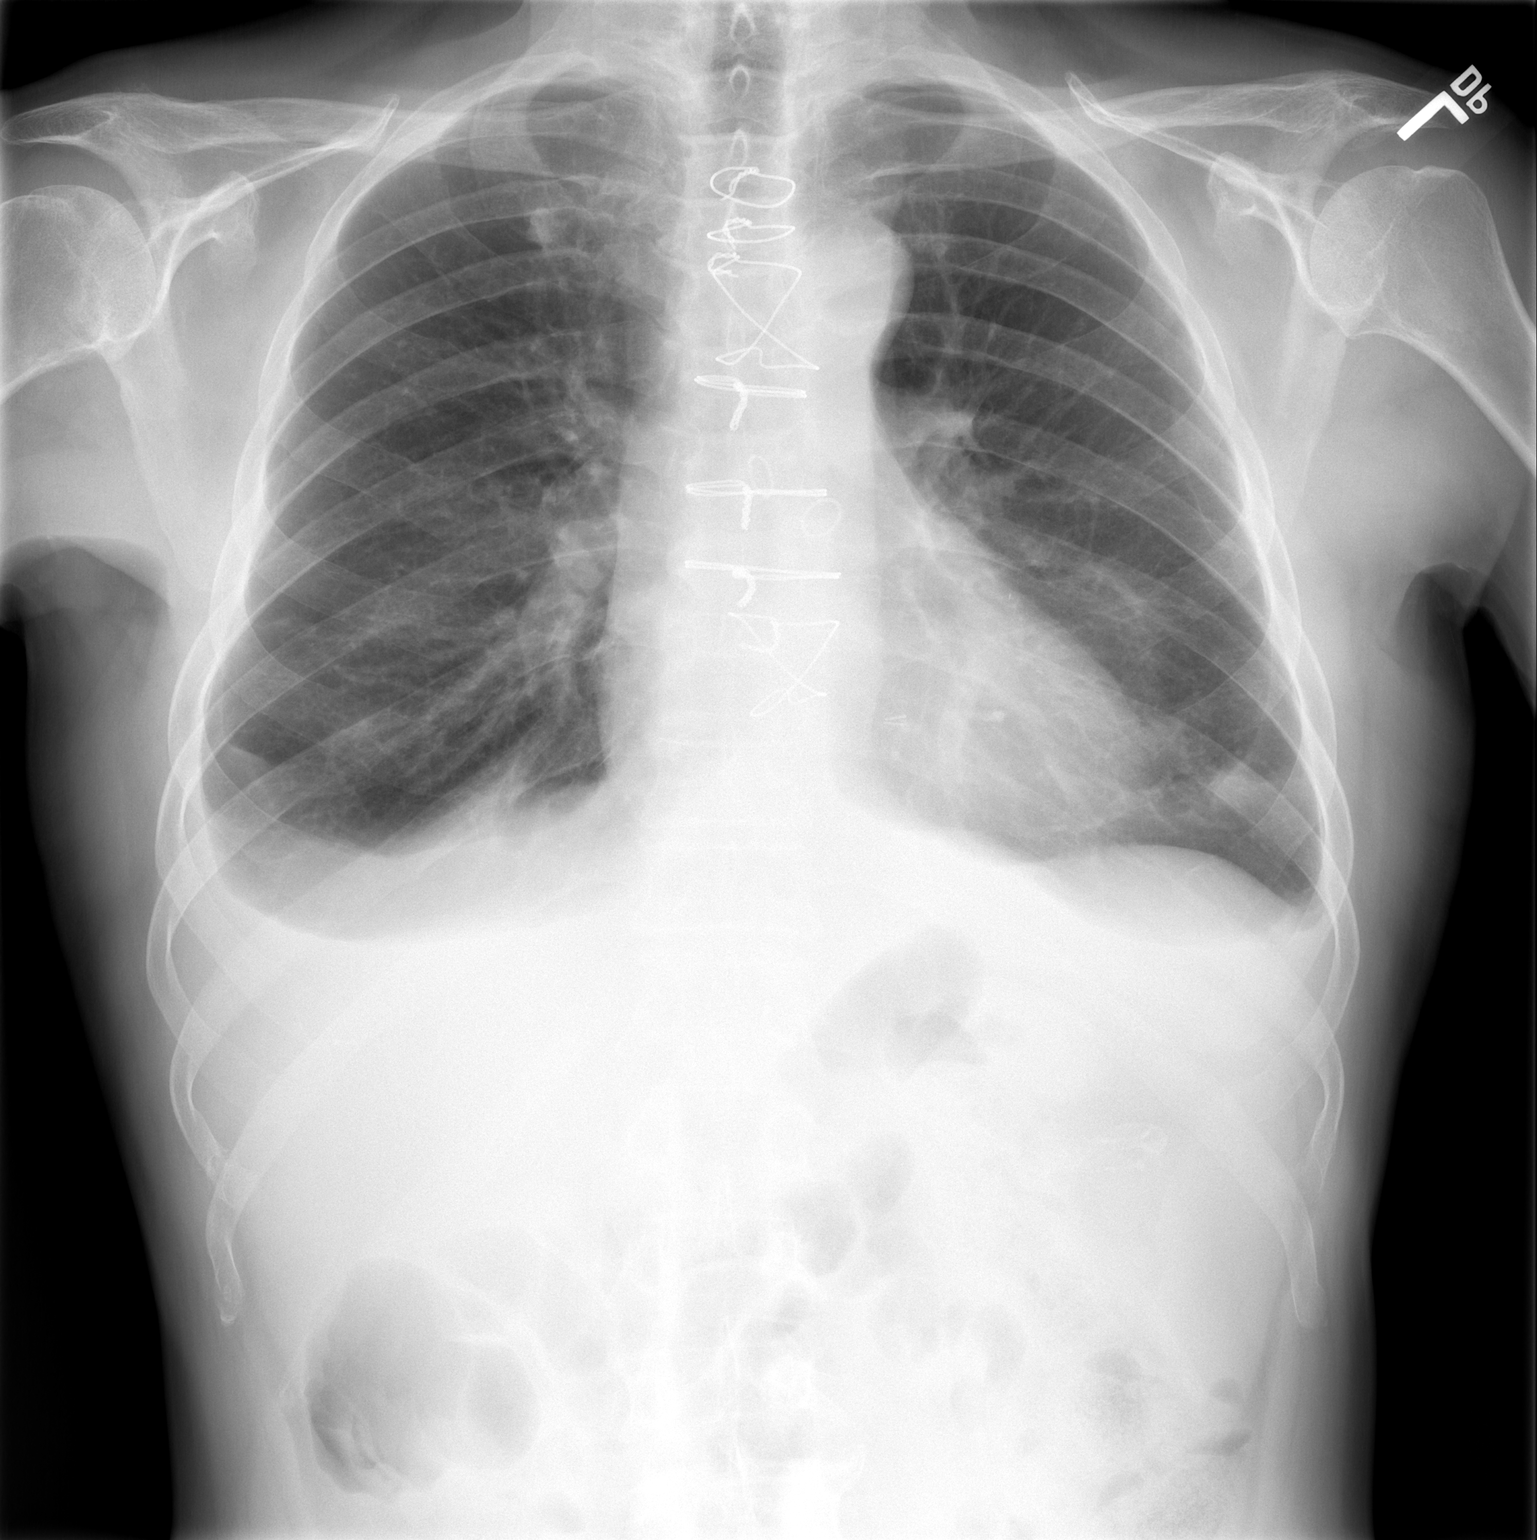

[w chest lat]
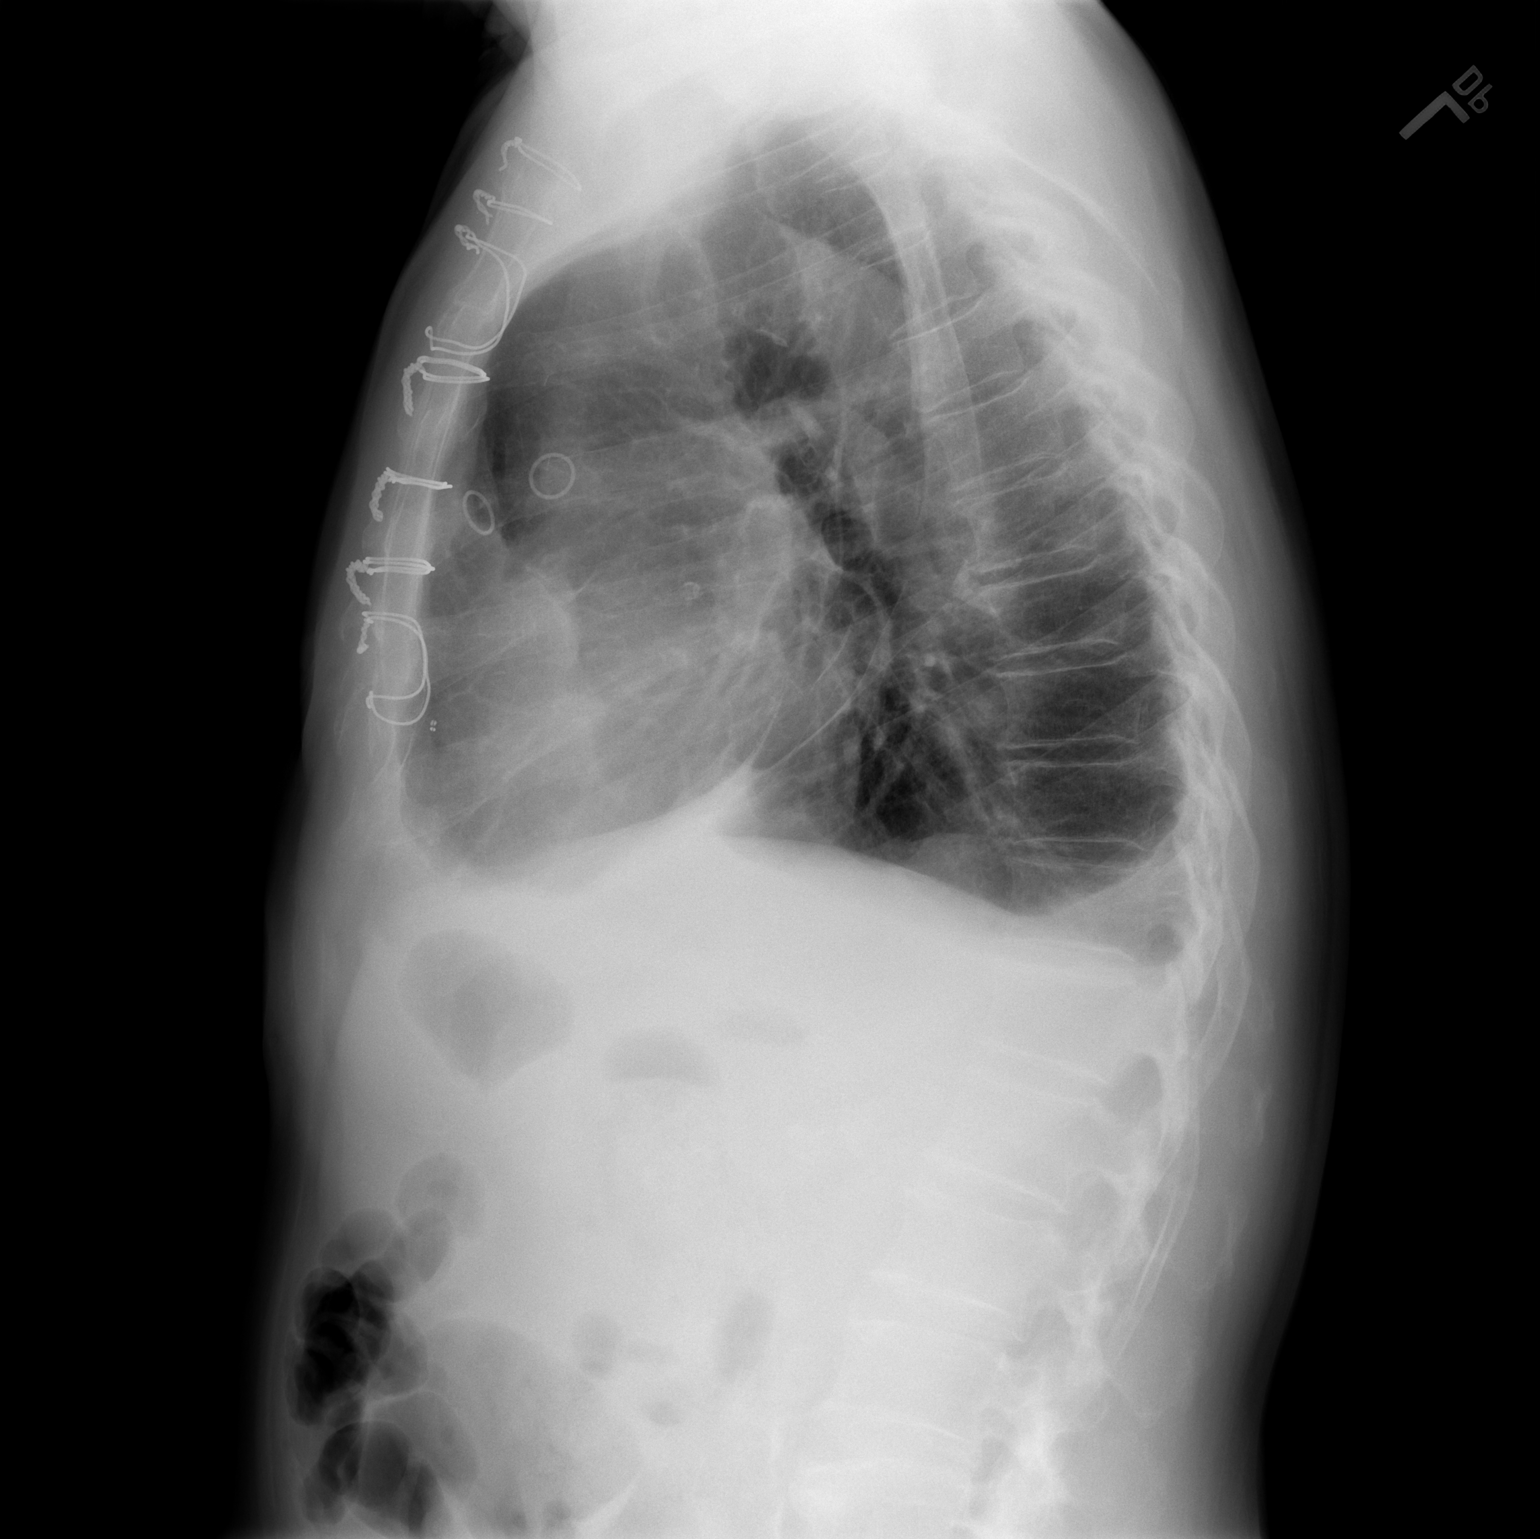

[2 of 2 positions shown; findings below may reference images not displayed]

FINDINGS: Nodular density at the left lung base. Blunting of right and Pieterjan
Fikes diaphragmatic sulci. Improved when compared to 09/29/2019.
This is particularly true on the lateral view. Small bilateral
effusions are noted.

Cardiomediastinal contours stable following CABG in median
sternotomy.

No visible pneumothorax.

Skeletal structures are otherwise unremarkable.
IMPRESSION: 1. Nodular density at the left lung base. Could reflect atelectasis
or infection. Suggest follow-up to ensure resolution.
2. Small bilateral pleural effusions. Improved as compared to pre
thoracentesis images obtained on 10/12/2019

## 2022-02-21 ENCOUNTER — Other Ambulatory Visit: Payer: Self-pay | Admitting: Cardiology

## 2022-02-28 ENCOUNTER — Inpatient Hospital Stay: Payer: Medicare Other | Attending: Nurse Practitioner

## 2022-02-28 ENCOUNTER — Other Ambulatory Visit: Payer: Self-pay

## 2022-02-28 DIAGNOSIS — D696 Thrombocytopenia, unspecified: Secondary | ICD-10-CM | POA: Insufficient documentation

## 2022-02-28 LAB — CBC WITH DIFFERENTIAL (CANCER CENTER ONLY)
Abs Immature Granulocytes: 0 10*3/uL (ref 0.00–0.07)
Basophils Absolute: 0 10*3/uL (ref 0.0–0.1)
Basophils Relative: 0 %
Eosinophils Absolute: 0.1 10*3/uL (ref 0.0–0.5)
Eosinophils Relative: 2 %
HCT: 38.8 % — ABNORMAL LOW (ref 39.0–52.0)
Hemoglobin: 13.4 g/dL (ref 13.0–17.0)
Immature Granulocytes: 0 %
Lymphocytes Relative: 33 %
Lymphs Abs: 1.7 10*3/uL (ref 0.7–4.0)
MCH: 31.5 pg (ref 26.0–34.0)
MCHC: 34.5 g/dL (ref 30.0–36.0)
MCV: 91.1 fL (ref 80.0–100.0)
Monocytes Absolute: 0.4 10*3/uL (ref 0.1–1.0)
Monocytes Relative: 7 %
Neutro Abs: 2.9 10*3/uL (ref 1.7–7.7)
Neutrophils Relative %: 58 %
Platelet Count: 103 10*3/uL — ABNORMAL LOW (ref 150–400)
RBC: 4.26 MIL/uL (ref 4.22–5.81)
RDW: 13.9 % (ref 11.5–15.5)
WBC Count: 5.1 10*3/uL (ref 4.0–10.5)
nRBC: 0 % (ref 0.0–0.2)

## 2022-03-23 ENCOUNTER — Telehealth: Payer: Self-pay

## 2022-03-23 NOTE — Patient Outreach (Signed)
  Care Coordination   03/23/2022 Name: GEOFFREY HYNES MRN: [id number] DOB: [birth year]   Care Coordination Outreach Attempts:  An unsuccessful telephone outreach was attempted today to offer the patient information about available care coordination services as a benefit of their health plan.   Follow Up Plan:  Additional outreach attempts will be made to offer the patient care coordination information and services.   Encounter Outcome:  No Answer  Care Coordination Interventions Activated:  No   Care Coordination Interventions:  No, not indicated    [facility] Care Management [phone]

## 2022-04-03 ENCOUNTER — Other Ambulatory Visit: Payer: Self-pay | Admitting: Cardiology

## 2022-04-05 ENCOUNTER — Other Ambulatory Visit: Payer: Self-pay | Admitting: Cardiology

## 2022-04-05 ENCOUNTER — Telehealth: Payer: Self-pay

## 2022-04-05 NOTE — Patient Outreach (Signed)
  Care Coordination   04/05/2022 Name: KARY COLAIZZI MRN: [id number] DOB: 11-Apr-1946   Care Coordination Outreach Attempts:  A second unsuccessful outreach was attempted today to offer the patient with information about available care coordination services as a benefit of their health plan.     Follow Up Plan:  Additional outreach attempts will be made to offer the patient care coordination information and services.   Encounter Outcome:  No Answer  Care Coordination Interventions Activated:  No   Care Coordination Interventions:  No, not indicated    [facility] Management [phone]

## 2022-04-12 ENCOUNTER — Other Ambulatory Visit: Payer: Self-pay | Admitting: Cardiology

## 2022-04-19 ENCOUNTER — Telehealth: Payer: Self-pay

## 2022-04-19 NOTE — Patient Outreach (Signed)
  Care Coordination   04/19/2022 Name: Dennis Roberts MRN: [id number] DOB: 1946-01-25   Care Coordination Outreach Attempts:  A third unsuccessful outreach was attempted today to offer the patient with information about available care coordination services as a benefit of their health plan.   Follow Up Plan:  No further outreach attempts will be made at this time. We have been unable to contact the patient to offer or enroll patient in care coordination services  Encounter Outcome:  No Answer  Care Coordination Interventions Activated:  No   Care Coordination Interventions:  No, not indicated    [facility] Management [phone]

## 2022-04-19 NOTE — Patient Instructions (Signed)
Visit Information  Thank you for taking time to speak with me today! Please do not hesitate to call if you require assistance.  Following are the goals we discussed today:   Goals Addressed             This Visit's Progress    COMPLETED: Care Coodination Activities       Care Coordination Interventions: Reviewed treatment plans. Reports doing very well with self-management. Reports adhering to plan for management of CHF, HTN and A-Fib. Denies complications or concerning symptoms. Discussed plan for primary care services. Currently does not have a PCP. Declined need for assistance with establishing care. Plans to complete follow-up with the Cardiology team in December. Plans to establish care with new PCP after that time. Reviewed medications. Reports managing well. Denies concerns r/t medication management or prescription cost. Assessed social determinant of health barriers. Denies current care management needs. Agreed to call for assistance if needed.        Dennis Roberts verbalized understanding of the information discussed during the telephonic outreach. Declined need for mailed instructions or resources.  No further follow up required: Dennis Roberts agreed to call for care coordination assistance if needed.   [place] Management [phone]

## 2022-04-19 NOTE — Patient Outreach (Signed)
  Care Coordination   Initial Visit Note   04/19/2022 Name: PRUITT TABOADA MRN: [id number] DOB: 1946-03-14  ZAKHAI MEISINGER is a 76 y.o. year old male. I spoke with  Garnette Gunner by phone today. Patient returned call.  What matters to the patients health and wellness today?  No Concerns Expressed.    Goals Addressed             This Visit's Progress    COMPLETED: Care Coodination Activities       Care Coordination Interventions: Reviewed treatment plans. Reports doing very well with self-management. Reports adhering to plan for management of CHF, HTN and A-Fib. Denies complications or concerning symptoms. Discussed plan for primary care services. Currently does not have a PCP. Declined need for assistance with establishing care. Plans to complete follow-up with the Cardiology team in December. Plans to establish care with new PCP after that time. Reviewed medications. Reports managing well. Denies concerns r/t medication management or prescription cost. Assessed social determinant of health barriers. Denies current care management needs. Agreed to call for assistance if needed.        SDOH assessments and interventions completed:  Yes  SDOH Interventions Today    Flowsheet Row Most Recent Value  SDOH Interventions   Food Insecurity Interventions Intervention Not Indicated  Transportation Interventions Intervention Not Indicated        Care Coordination Interventions Activated:  Yes  Care Coordination Interventions:  Yes, provided   Follow up plan: No further intervention required. Patient agreed to call for assistance if needed.  Encounter Outcome:  Pt. Visit Completed   [facility] Management [phone]

## 2022-04-26 ENCOUNTER — Other Ambulatory Visit: Payer: Self-pay | Admitting: Cardiology

## 2022-04-27 MED ORDER — SPIRONOLACTONE 25 MG PO TABS: 12.5000 mg | ORAL_TABLET | Freq: Every day | ORAL | 0 refills | Status: DC

## 2022-04-27 NOTE — Addendum Note (Signed)
Addended by: Wadie Lessen on: 04/27/2022 04:44 PM   Modules accepted: Orders

## 2022-05-28 ENCOUNTER — Telehealth: Payer: Self-pay

## 2022-05-28 NOTE — Telephone Encounter (Signed)
**Note De-Identified Alajia Schmelzer Obfuscation** We received a Entresto 24-26 mg RX form from [facility].  I have completed the form for #180 with 3 refills and have e-mailed all it to Dr Anne Fu nurse so she can obtain his signature, date it, and to then fax it to [employer] at the fax number circled on the form.

## 2022-05-28 NOTE — Telephone Encounter (Signed)
Printed - will have  Dr Anne Fu to sign when back in office.

## 2022-05-29 ENCOUNTER — Other Ambulatory Visit: Payer: Self-pay | Admitting: Nurse Practitioner

## 2022-05-29 ENCOUNTER — Inpatient Hospital Stay: Payer: Medicare Other | Attending: Nurse Practitioner

## 2022-05-29 ENCOUNTER — Inpatient Hospital Stay (HOSPITAL_BASED_OUTPATIENT_CLINIC_OR_DEPARTMENT_OTHER): Payer: Medicare Other | Admitting: Nurse Practitioner

## 2022-05-29 ENCOUNTER — Encounter: Payer: Self-pay | Admitting: Nurse Practitioner

## 2022-05-29 VITALS — BP 130/84 | HR 80 | Temp 98.1°F | Resp 15 | Wt 163.5 lb

## 2022-05-29 DIAGNOSIS — Z8042 Family history of malignant neoplasm of prostate: Secondary | ICD-10-CM | POA: Insufficient documentation

## 2022-05-29 DIAGNOSIS — D696 Thrombocytopenia, unspecified: Secondary | ICD-10-CM | POA: Diagnosis not present

## 2022-05-29 DIAGNOSIS — Z87891 Personal history of nicotine dependence: Secondary | ICD-10-CM | POA: Insufficient documentation

## 2022-05-29 DIAGNOSIS — Z808 Family history of malignant neoplasm of other organs or systems: Secondary | ICD-10-CM | POA: Diagnosis not present

## 2022-05-29 DIAGNOSIS — Z951 Presence of aortocoronary bypass graft: Secondary | ICD-10-CM | POA: Diagnosis not present

## 2022-05-29 DIAGNOSIS — E785 Hyperlipidemia, unspecified: Secondary | ICD-10-CM | POA: Insufficient documentation

## 2022-05-29 DIAGNOSIS — I509 Heart failure, unspecified: Secondary | ICD-10-CM | POA: Insufficient documentation

## 2022-05-29 LAB — CBC WITH DIFFERENTIAL (CANCER CENTER ONLY)
Abs Immature Granulocytes: 0.03 10*3/uL (ref 0.00–0.07)
Basophils Absolute: 0 10*3/uL (ref 0.0–0.1)
Basophils Relative: 0 %
Eosinophils Absolute: 0.1 10*3/uL (ref 0.0–0.5)
Eosinophils Relative: 2 %
HCT: 42.9 % (ref 39.0–52.0)
Hemoglobin: 14.9 g/dL (ref 13.0–17.0)
Immature Granulocytes: 1 %
Lymphocytes Relative: 36 %
Lymphs Abs: 2.2 10*3/uL (ref 0.7–4.0)
MCH: 32 pg (ref 26.0–34.0)
MCHC: 34.7 g/dL (ref 30.0–36.0)
MCV: 92.3 fL (ref 80.0–100.0)
Monocytes Absolute: 0.4 10*3/uL (ref 0.1–1.0)
Monocytes Relative: 7 %
Neutro Abs: 3.4 10*3/uL (ref 1.7–7.7)
Neutrophils Relative %: 54 %
Platelet Count: 126 10*3/uL — ABNORMAL LOW (ref 150–400)
RBC: 4.65 MIL/uL (ref 4.22–5.81)
RDW: 13.6 % (ref 11.5–15.5)
WBC Count: 6.2 10*3/uL (ref 4.0–10.5)
nRBC: 0 % (ref 0.0–0.2)

## 2022-05-29 NOTE — Progress Notes (Signed)
[facility]   Telephone:(336) [phone] Fax:(336) [phone]   Clinic Follow up Note   Patient Care Team: Patient, No Pcp Per as PCP - General (General Practice) Jake Bathe, MD as PCP - Cardiology (Cardiology) 05/29/2022  CHIEF COMPLAINT: Follow up thrombocytopenia, likely ITP  CURRENT THERAPY: Observation and oral B12 500 mcg daily  INTERVAL HISTORY: Dennis Roberts returns for follow up as scheduled, last seen by me 11/28/21. He is doing well without any specific concerns. Denies bruising/bleeding, recent infection, or upcoming procedures. He has not established with a PCP yet and plans to do so after the holidays. He will see cardiology in December.   All other systems were reviewed with the patient and are negative.  MEDICAL HISTORY:  Past Medical History:  Diagnosis Date   CHF (congestive heart failure) ([facility])    Coronary artery disease     SURGICAL HISTORY: Past Surgical History:  Procedure Laterality Date   ARTERIAL LINE INSERTION N/A 09/18/2019   Procedure: ARTERIAL LINE INSERTION;  Surgeon: Dolores Patty, MD;  Location: [facility];  Service: Cardiovascular;  Laterality: N/A;   CARDIAC CATHETERIZATION     CORONARY ARTERY BYPASS GRAFT N/A 09/19/2019   Procedure: CORONARY ARTERY BYPASS GRAFTING (CABG) using LIMA to LAD; Endoscopic harvest right saphenous vein: SVG tp Diag; SVG sequential to OM1 and OM2.;  Surgeon: Linden Dolin, MD;  Location: [facility];  Service: Open Heart Surgery;  Laterality: N/A;   ENDOVEIN HARVEST OF GREATER SAPHENOUS VEIN Right 09/19/2019   Procedure: [place] Of Greater Saphenous Vein;  Surgeon: Linden Dolin, MD;  Location: [facility];  Service: Open Heart Surgery;  Laterality: Right;   IR THORACENTESIS ASP PLEURAL SPACE W/IMG GUIDE  10/16/2019   NOSE SURGERY     PATENT DUCTUS ARTERIOUS REPAIR N/A 09/19/2019   Procedure: CLOSURE OF PATENT DUCTUS ARTERIOSUS;  Surgeon: Linden Dolin, MD;  Location: [facility];  Service: Open  Heart Surgery;  Laterality: N/A;   RIGHT/LEFT HEART CATH AND CORONARY ANGIOGRAPHY N/A 09/18/2019   Procedure: RIGHT/LEFT HEART CATH AND CORONARY ANGIOGRAPHY;  Surgeon: Dolores Patty, MD;  Location: [facility];  Service: Cardiovascular;  Laterality: N/A;   TEE WITHOUT CARDIOVERSION N/A 09/19/2019   Procedure: TRANSESOPHAGEAL ECHOCARDIOGRAM (TEE);  Surgeon: Linden Dolin, MD;  Location: [facility] OR;  Service: Open Heart Surgery;  Laterality: N/A;   VENTRICULAR ASSIST DEVICE INSERTION N/A 09/18/2019   Procedure: VENTRICULAR ASSIST DEVICE INSERTION;  Surgeon: Dolores Patty, MD;  Location: [facility];  Service: Cardiovascular;  Laterality: N/A;    I have reviewed the social history and family history with the patient and they are unchanged from previous note.  ALLERGIES:  is allergic to penicillins and sulfa antibiotics.  MEDICATIONS:  Current Outpatient Medications  Medication Sig Dispense Refill   aspirin EC 81 MG tablet Take 81 mg by mouth daily. Swallow whole.     carvedilol (COREG) 3.125 MG tablet Take 1 tablet (3.125 mg total) by mouth 2 (two) times daily. 180 tablet 0   cyanocobalamin (VITAMIN B12) 500 MCG tablet Take 500 mcg by mouth daily.     lactobacillus acidophilus (BACID) TABS tablet Take 1 tablet by mouth daily.      Multiple Vitamin (MULTIVITAMIN) capsule Take 1 capsule by mouth daily.     OVER THE COUNTER MEDICATION Apply 1 application topically daily as needed (pain). CBD cream     potassium chloride SA (KLOR-CON) 20 MEQ tablet Take 1 tablet (20 mEq  total) by mouth as needed. With every dose of lasix 90 tablet 3   sacubitril-valsartan (ENTRESTO) 24-26 MG Take 1 tablet by mouth 2 (two) times daily. 180 tablet 3   spironolactone (ALDACTONE) 25 MG tablet Take 0.5 tablets (12.5 mg total) by mouth daily. Please call to schedule an overdue appointment with Dr. Anne Fu for refills, [phone], thank you.FINAL ATTEMPT, NO ADDTL REFILLS 7 tablet 0   furosemide  (LASIX) 40 MG tablet Take 1 tablet (40 mg total) by mouth daily as needed (for weight gain of 2 pounds or more in 24 hours). 15 tablet 1   rosuvastatin (CRESTOR) 20 MG tablet TAKE 1 TABLET BY MOUTH  DAILY (Patient not taking: Reported on 05/29/2022) 90 tablet 2   No current facility-administered medications for this visit.    PHYSICAL EXAMINATION: ECOG PERFORMANCE STATUS: 0 - Asymptomatic  Vitals:   05/29/22 1049  BP: 130/84  Pulse: 80  Resp: 15  Temp: 98.1 F (36.7 C)  SpO2: 100%   Filed Weights   05/29/22 1049  Weight: 163 lb 8 oz (74.2 kg)    GENERAL:alert, no distress and comfortable SKIN: no obvious ecchymosis or petechiae  EYES: sclera clear LUNGS: normal breathing effort HEART:  no lower extremity edema NEURO: alert & oriented x 3 with fluent speech, nonfocal  LABORATORY DATA:  I have reviewed the data as listed    Latest Ref Rng & Units 05/29/2022    9:55 AM 02/28/2022   10:34 AM 11/28/2021   10:03 AM  CBC  WBC 4.0 - 10.5 K/uL 6.2  5.1  5.6   Hemoglobin 13.0 - 17.0 g/dL 87.5  64.3  32.9   Hematocrit 39.0 - 52.0 % 42.9  38.8  38.7   Platelets 150 - 400 K/uL 126  103  92         Latest Ref Rng & Units 02/17/2021    9:11 AM 11/22/2020    8:34 AM 08/09/2020    2:22 PM  CMP  Glucose 65 - 99 mg/dL 518   841   BUN 8 - 27 mg/dL 13   22   Creatinine 6.60 - 1.27 mg/dL 6.30   1.60   Sodium 109 - 144 mmol/L 139   142   Potassium 3.5 - 5.2 mmol/L 4.2   4.6   Chloride 96 - 106 mmol/L 104   104   CO2 20 - 29 mmol/L 23   24   Calcium 8.6 - 10.2 mg/dL 9.4   9.7   ALT 0 - 44 IU/L  21        RADIOGRAPHIC STUDIES: I have personally reviewed the radiological images as listed and agreed with the findings in the report. No results found.   ASSESSMENT & PLAN: 76 yo male with    Thrombocytopenia -initially seen 02/2021, he does not have historical labs to compare, but appears he had normal plt count 09/18/19 at presentation to ED for acute heart failure, and developed  thrombocytopenia and anemia in the hospital. Lowest plt 46K.  -anemia and thrombocytopenia resolved after hospitalization, but he developed recurrent and worsened thrombocytopenia in 08/2020 -work up showed normal B12, MMA, folate, immature plt count, and no liver/spleen disease on abdominal US. This is likely ITP -He started oral B12 500 mcg daily -Dennis Roberts appears stable.  Denies recent bruising/bleeding, no petechiae on exam.  Today CBC shows platelet 126K -does not require treatment unless plt <50K and/or severe bleeding  -we reviewed plt can drop during acute illness. He will  call if he has planned procedures -Given the improvement, will repeat CBC in 6 and 12 months, and follow-up in 1 year, or sooner if needed  2. HL, CHF s/p CABG -admitted to hospital 09/2019 with acute HF, he had not received routine medical care prior to that. Thrombocytopenia began during this admission  -on Entresto, coreg, aspirin 81 mg daily.  Okay to continue aspirin unless platelet below 50K or he develops bleeding   3. Health maintenance  -he quit smoking in 1974 after 1 PPD x6 years. He smokes marijuana rarely -he drinks moderate alcohol, 12 beers/wk but sometimes more. I previously encouraged him to reduce alcohol intake -B12 in low-normal range on initial workup, he takes 500 mcg daily -father had prostate cancer and mother had melanoma. I strongly encouraged him to begin cancer screenings but he has not done so.  Also overdue for screening colonoscopy, has never had one -he repeatedly declines PCP referral, he states he will look into this after the holidays    PLAN: -labs reviewed, plt 126K -continue oral B12 and observation -Lab in 6 and 12 months, or sooner if he has acute infection, bruising/bleeding, or planned surgery -F/up in 1 year   All questions were answered. The patient knows to call the clinic with any problems, questions or concerns. No barriers to learning were detected.     Pollyann Samples, NP 05/29/22

## 2022-06-04 NOTE — Telephone Encounter (Signed)
RX has been signed by Dr Anne Fu and faxed to [facility] as instructed.

## 2022-06-06 ENCOUNTER — Other Ambulatory Visit: Payer: Self-pay

## 2022-06-06 MED ORDER — ENTRESTO 24-26 MG PO TABS: 1.0000 | ORAL_TABLET | Freq: Two times a day (BID) | ORAL | 0 refills | Status: AC

## 2022-06-06 MED ORDER — ENTRESTO 24-26 MG PO TABS: 1.0000 | ORAL_TABLET | Freq: Two times a day (BID) | ORAL | 0 refills | Status: DC

## 2022-06-06 NOTE — Addendum Note (Signed)
Addended by: Margaret Pyle D on: 06/06/2022 04:06 PM   Modules accepted: Orders

## 2022-06-14 ENCOUNTER — Ambulatory Visit: Payer: Medicare Other | Attending: Cardiology | Admitting: Cardiology

## 2022-06-14 ENCOUNTER — Encounter: Payer: Self-pay | Admitting: Cardiology

## 2022-06-14 ENCOUNTER — Telehealth: Payer: Self-pay | Admitting: *Deleted

## 2022-06-14 VITALS — BP 110/70 | HR 73 | Ht 65.0 in | Wt 161.0 lb

## 2022-06-14 DIAGNOSIS — Z951 Presence of aortocoronary bypass graft: Secondary | ICD-10-CM

## 2022-06-14 DIAGNOSIS — I5022 Chronic systolic (congestive) heart failure: Secondary | ICD-10-CM

## 2022-06-14 DIAGNOSIS — E782 Mixed hyperlipidemia: Secondary | ICD-10-CM | POA: Diagnosis not present

## 2022-06-14 DIAGNOSIS — I480 Paroxysmal atrial fibrillation: Secondary | ICD-10-CM | POA: Diagnosis not present

## 2022-06-14 DIAGNOSIS — I2510 Atherosclerotic heart disease of native coronary artery without angina pectoris: Secondary | ICD-10-CM

## 2022-06-14 DIAGNOSIS — Z789 Other specified health status: Secondary | ICD-10-CM | POA: Diagnosis not present

## 2022-06-14 DIAGNOSIS — Z79899 Other long term (current) drug therapy: Secondary | ICD-10-CM | POA: Diagnosis not present

## 2022-06-14 NOTE — Telephone Encounter (Signed)
Pt brought patient assistance application for Entresto with him to his appt today with Dr Anne Fu.  Application completed and faxed to 1 [phone] along with copy of pt's insurance cards, pt's [id number] from 2022 and medication/allergy list.  Confirmation of successful fax received.

## 2022-06-14 NOTE — Progress Notes (Signed)
Cardiology Office Note:    Date:  06/14/2022   ID:  Dennis Roberts, DOB Nov 22, 1945, MRN [id number]  PCP:  Patient, No Pcp Per  [facility] Cardiologist:  Donato Schultz, MD  [facility] Electrophysiologist:  None   Referring MD: No ref. provider found   History of Present Illness:    Dennis Roberts is a 76 y.o. male here for the follow-up of CAD s/p CABG, CHF, and ICM.  CABG 09/2019-LIMA to LAD, SVG to diagonal, SVG to OM 1 and 2 by Dr. Vickey Sages.  Brief paroxysmal atrial fibrillation post op with amiodarone utilized. Is off of digoxin.  NYHA class I.  Has seen Dr. Gala Romney in the past.  Still had trouble with Crestor.  Made him feel old.  He is exercising, stationary bicycle daily.  Watching his weight.  Feels well.  Doing the things he wants to do.  NYHA class I.  Denies any chest pain fevers chills nausea vomiting orthopnea.   Past Medical History:  Diagnosis Date   CHF (congestive heart failure) (HCC)    Coronary artery disease     Past Surgical History:  Procedure Laterality Date   ARTERIAL LINE INSERTION N/A 09/18/2019   Procedure: ARTERIAL LINE INSERTION;  Surgeon: Dolores Patty, MD;  Location: [facility];  Service: Cardiovascular;  Laterality: N/A;   CARDIAC CATHETERIZATION     CORONARY ARTERY BYPASS GRAFT N/A 09/19/2019   Procedure: CORONARY ARTERY BYPASS GRAFTING (CABG) using LIMA to LAD; Endoscopic harvest right saphenous vein: SVG tp Diag; SVG sequential to OM1 and OM2.;  Surgeon: Linden Dolin, MD;  Location: [facility] OR;  Service: Open Heart Surgery;  Laterality: N/A;   ENDOVEIN HARVEST OF GREATER SAPHENOUS VEIN Right 09/19/2019   Procedure: Mack Guise Of Greater Saphenous Vein;  Surgeon: Linden Dolin, MD;  Location: [facility] OR;  Service: Open Heart Surgery;  Laterality: Right;   IR THORACENTESIS ASP PLEURAL SPACE W/IMG GUIDE  10/16/2019   NOSE SURGERY     PATENT DUCTUS ARTERIOUS REPAIR N/A 09/19/2019   Procedure: CLOSURE OF PATENT DUCTUS ARTERIOSUS;   Surgeon: Linden Dolin, MD;  Location: [facility] OR;  Service: Open Heart Surgery;  Laterality: N/A;   RIGHT/LEFT HEART CATH AND CORONARY ANGIOGRAPHY N/A 09/18/2019   Procedure: RIGHT/LEFT HEART CATH AND CORONARY ANGIOGRAPHY;  Surgeon: Dolores Patty, MD;  Location: [facility];  Service: Cardiovascular;  Laterality: N/A;   TEE WITHOUT CARDIOVERSION N/A 09/19/2019   Procedure: TRANSESOPHAGEAL ECHOCARDIOGRAM (TEE);  Surgeon: Linden Dolin, MD;  Location: [facility] OR;  Service: Open Heart Surgery;  Laterality: N/A;   VENTRICULAR ASSIST DEVICE INSERTION N/A 09/18/2019   Procedure: VENTRICULAR ASSIST DEVICE INSERTION;  Surgeon: Dolores Patty, MD;  Location: [facility];  Service: Cardiovascular;  Laterality: N/A;    Current Medications: Current Meds  Medication Sig   aspirin EC 81 MG tablet Take 81 mg by mouth daily. Swallow whole.   carvedilol (COREG) 3.125 MG tablet Take 1 tablet (3.125 mg total) by mouth 2 (two) times daily.   cyanocobalamin (VITAMIN B12) 500 MCG tablet Take 500 mcg by mouth daily.   furosemide (LASIX) 40 MG tablet Take 1 tablet (40 mg total) by mouth daily as needed (for weight gain of 2 pounds or more in 24 hours).   lactobacillus acidophilus (BACID) TABS tablet Take 1 tablet by mouth daily.    Multiple Vitamin (MULTIVITAMIN) capsule Take 1 capsule by mouth daily.   OVER THE COUNTER MEDICATION Apply 1 application  topically daily as needed (pain). CBD cream   potassium chloride SA (KLOR-CON) 20 MEQ tablet Take 1 tablet (20 mEq total) by mouth as needed. With every dose of lasix   sacubitril-valsartan (ENTRESTO) 24-26 MG Take 1 tablet by mouth 2 (two) times daily.   spironolactone (ALDACTONE) 25 MG tablet Take 0.5 tablets (12.5 mg total) by mouth daily. Please call to schedule an overdue appointment with Dr. Anne Fu for refills, [phone], thank you.FINAL ATTEMPT, NO ADDTL REFILLS   [DISCONTINUED] rosuvastatin (CRESTOR) 20 MG tablet TAKE 1 TABLET BY MOUTH   DAILY     Allergies:   Penicillins and Sulfa antibiotics   Social History   Socioeconomic History   Marital status: Divorced    Spouse name: Not on file   Number of children: Not on file   Years of education: 15   Highest education level: Some college, no degree  Occupational History   Not on file  Tobacco Use   Smoking status: Former    Packs/day: 1.00    Years: 6.00    Total pack years: 6.00    Types: Cigarettes    Quit date: 1974    Years since quitting: 49.9   Smokeless tobacco: Former  Building services engineer Use: Never used  Substance and Sexual Activity   Alcohol use: Yes    Alcohol/week: 12.0 standard drinks of alcohol    Types: 12 Cans of beer per week    Comment: 12 beers per week, sometimes more   Drug use: Yes    Types: Marijuana    Comment: rarely   Sexual activity: Not on file  Other Topics Concern   Not on file  Social History Narrative   Not on file   Social Determinants of Health   Financial Resource Strain: Not on file  Food Insecurity: No Food Insecurity (04/19/2022)   Hunger Vital Sign    Worried About Running Out of Food in the Last Year: Never true    Ran Out of Food in the Last Year: Never true  Transportation Needs: No Transportation Needs (04/19/2022)   PRAPARE - Administrator, Civil Service (Medical): No    Lack of Transportation (Non-Medical): No  Physical Activity: Not on file  Stress: Not on file  Social Connections: Not on file     Family History: The patient's family history includes Cancer in his father and mother; Healthy in his mother; Heart disease in his father.  ROS:   Please see the history of present illness.    (+) Aches in bilateral hand joints All other systems reviewed and are negative.  EKGs/Labs/Other Studies Reviewed:    Echo 04/13/2020:  1. Left ventricular ejection fraction, by estimation, is 40 to 45%. Left  ventricular ejection fraction by 2D MOD biplane is 43.2 %. The left  ventricle has  mildly decreased function. The left ventricle demonstrates  global hypokinesis. Left ventricular  diastolic parameters are consistent with Grade I diastolic dysfunction  (impaired relaxation).   2. Right ventricular systolic function is mildly reduced. The right  ventricular size is normal.   3. Left atrial size was mildly dilated.   4. Right atrial size was mildly dilated.   5. The mitral valve is normal in structure. Trivial mitral valve  regurgitation.   6. The aortic valve is tricuspid. Aortic valve regurgitation is not  visualized.   7. The inferior vena cava is normal in size with greater than 50%  respiratory variability, suggesting right atrial pressure of  3 mmHg.   Comparison(s): Changes from prior study are noted. Compared to prior  study, significant improvement in LV and RV function.   RHC/LHC 09/18/2019: Prox RCA lesion is 99% stenosed. Dist RCA lesion is 100% stenosed. Mid LM to Dist LM lesion is 99% stenosed. Ost Cx to Prox Cx lesion is 99% stenosed. Mid Cx to Dist Cx lesion is 60% stenosed. LPAV lesion is 80% stenosed. Ost LAD lesion is 90% stenosed. Prox LAD to Mid LAD lesion is 80% stenosed. Mid LAD lesion is 80% stenosed.   Findings: Ao = 155/78 (96) LV = 114/39 RA = 13 RV = 50/12 PA = 52/32 (41) PCW = 33 Fick cardiac output/index = 3.7/1.84 Thermo CO/CI = 3.7/1.87 PVR = 2.1 WU SVR = 1809  FA sat = 97% PA sat = 62%, 62% CPO = 0.78 (on milrinone 0.25)   Assessment: 1. 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 Cardiogenic shock   Plan/Discussion: He has been stabilized with Impella support. Transfer to ICU. TCTS has seen. Likely emergent cath in am.   EKG: 06/14/2022-sinus rhythm first-degree AV block 73 bpm poor R wave progression.  Recent Labs: 05/29/2022: Hemoglobin 14.9; Platelet Count 126   Recent Lipid Panel    Component Value Date/Time   CHOL 104 11/22/2020 0834   TRIG 81 11/22/2020 0834   HDL 47 11/22/2020 0834   CHOLHDL 2.2 11/22/2020  0834   CHOLHDL 3.4 09/19/2019 0238   VLDL 10 09/19/2019 0238   LDLCALC 41 11/22/2020 0834     Risk Assessment/Calculations:      Physical Exam:    VS:  BP 110/70 (BP Location: Left Arm, Patient Position: Sitting, Cuff Size: Normal)   Pulse 73   Ht 5\' 5"  (1.651 m)   Wt 161 lb (73 kg)   BMI 26.79 kg/m     Wt Readings from Last 3 Encounters:  06/14/22 161 lb (73 kg)  05/29/22 163 lb 8 oz (74.2 kg)  11/28/21 165 lb 4.8 oz (75 kg)    GEN: Well nourished, well developed in no acute distress HEENT: Normal NECK: No JVD; No carotid bruits LYMPHATICS: No lymphadenopathy CARDIAC: RRR, no murmurs, rubs, gallops RESPIRATORY:  Clear to auscultation without rales, wheezing or rhonchi  ABDOMEN: Soft, non-tender, non-distended MUSCULOSKELETAL:  No edema; No deformity  SKIN: Warm and dry NEUROLOGIC:  Alert and oriented x 3 PSYCHIATRIC:  Normal affect    ASSESSMENT:    1. Coronary artery disease involving native coronary artery of native heart without angina pectoris   2. PAF (paroxysmal atrial fibrillation) (HCC)   3. Medication management   4. S/P CABG x 4   5. Chronic systolic heart failure (HCC)   6. Mixed hyperlipidemia   7. Statin intolerance      PLAN:   In order of problems listed above:  Chronic systolic heart failure (HCC) CABG 09/2019.  Dr. Vickey SagesAtkins.  LIMA to LAD SVG to diagonal SVG sequential to OM1 and OM 2.  Overall doing quite well.  Continue with goal-directed medical therapy.  Aspirin 81 mg.  No anginal symptoms.  Scar well-healed.  Currently on low-dose carvedilol 3.125 mg twice a day, Entresto low-dose 24/26 twice a day, spironolactone 0.5 tablets per 12.5 mg total daily, Lasix as needed 40 mg and potassium 20 mEq as needed.  Doing well.  Continue with refills.  Labs looked great. - We will check an echocardiogram since it has been 2 years.  Prior EF 40%.  He has been taking his goal-directed medical  therapy.   Mixed hyperlipidemia Had aches on Crestor.  Stopped  it.  Felt better.  This medication made him feel very old.  Lets go ahead and refer him to the lipid clinic for further evaluation and management.  Expressed the importance of low LDL especially in the setting of prior CABG.  Reduce risk of heart attack.   PAF (paroxysmal atrial fibrillation) (HCC) Brief postoperative atrial fibrillation with short-term amiodarone use.  No further evidence of atrial fibrillation.  Excellent.  No recurrence   Thrombocytopenia Seen by cancer center, on 05/29/2022 platelet count was 126 up from 92 a few months prior.  Improving.  No bleeding issues.  He is on low-dose aspirin given his prior bypass surgery.      Follow-up: 1 year.   Medication Adjustments/Labs and Tests Ordered: Current medicines are reviewed at length with the patient today.  Concerns regarding medicines are outlined above.   Orders Placed This Encounter  Procedures   AMB Referral to [facility]   EKG 12-Lead   ECHOCARDIOGRAM COMPLETE    No orders of the defined types were placed in this encounter.   Patient Instructions  Medication Instructions:  The current medical regimen is effective;  continue present plan and medications.  *If you need a refill on your cardiac medications before your next appointment, please call your pharmacy*  Testing/Procedures: Your physician has requested that you have an echocardiogram. Echocardiography is a painless test that uses sound waves to create images of your heart. It provides your doctor with information about the size and shape of your heart and how well your heart's chambers and valves are working. This procedure takes approximately one hour. There are no restrictions for this procedure. Please do NOT wear cologne, perfume, aftershave, or lotions (deodorant is allowed). Please arrive 15 minutes prior to your appointment time.  You have been referred to our Lipid Clinic to discuss options to treat your cholesterol.  Follow-Up: At  [facility], you and your health needs are our priority.  As part of our continuing mission to provide you with exceptional heart care, we have created designated Provider Care Teams.  These Care Teams include your primary Cardiologist (physician) and Advanced Practice Providers (APPs -  Physician Assistants and Nurse Practitioners) who all work together to provide you with the care you need, when you need it.  We recommend signing up for the patient portal called "MyChart".  Sign up information is provided on this After Visit Summary.  MyChart is used to connect with patients for Virtual Visits (Telemedicine).  Patients are able to view lab/test results, encounter notes, upcoming appointments, etc.  Non-urgent messages can be sent to your provider as well.   To learn more about what you can do with MyChart, go to [email or web address].    Your next appointment:   1 year(s)  The format for your next appointment:   In Person  Provider:   Donato Schultz, MD     Important Information About Sugar         I,Mathew Stumpf,acting as a scribe for Donato Schultz, MD.,have documented all relevant documentation on the behalf of Donato Schultz, MD,as directed by  Donato Schultz, MD while in the presence of Donato Schultz, MD.  I, Donato Schultz, MD, have reviewed all documentation for this visit. The documentation on 06/14/22 for the exam, diagnosis, procedures, and orders are all accurate and complete.   Signed, Donato Schultz, MD  06/14/2022 10:00 AM    [facility]  [facility]

## 2022-06-14 NOTE — Patient Instructions (Signed)
Medication Instructions:  The current medical regimen is effective;  continue present plan and medications.  *If you need a refill on your cardiac medications before your next appointment, please call your pharmacy*  Testing/Procedures: Your physician has requested that you have an echocardiogram. Echocardiography is a painless test that uses sound waves to create images of your heart. It provides your doctor with information about the size and shape of your heart and how well your heart's chambers and valves are working. This procedure takes approximately one hour. There are no restrictions for this procedure. Please do NOT wear cologne, perfume, aftershave, or lotions (deodorant is allowed). Please arrive 15 minutes prior to your appointment time.  You have been referred to our Lipid Clinic to discuss options to treat your cholesterol.  Follow-Up: At [facility], you and your health needs are our priority.  As part of our continuing mission to provide you with exceptional heart care, we have created designated Provider Care Teams.  These Care Teams include your primary Cardiologist (physician) and Advanced Practice Providers (APPs -  Physician Assistants and Nurse Practitioners) who all work together to provide you with the care you need, when you need it.  We recommend signing up for the patient portal called "MyChart".  Sign up information is provided on this After Visit Summary.  MyChart is used to connect with patients for Virtual Visits (Telemedicine).  Patients are able to view lab/test results, encounter notes, upcoming appointments, etc.  Non-urgent messages can be sent to your provider as well.   To learn more about what you can do with MyChart, go to [email or web address].    Your next appointment:   1 year(s)  The format for your next appointment:   In Person  Provider:   Donato Schultz, MD     Important Information About Sugar

## 2022-06-17 ENCOUNTER — Other Ambulatory Visit: Payer: Self-pay | Admitting: Cardiology

## 2022-07-12 ENCOUNTER — Ambulatory Visit (HOSPITAL_COMMUNITY): Payer: Medicare Other | Attending: Cardiology

## 2022-07-12 DIAGNOSIS — I2510 Atherosclerotic heart disease of native coronary artery without angina pectoris: Secondary | ICD-10-CM | POA: Diagnosis not present

## 2022-07-12 DIAGNOSIS — I5022 Chronic systolic (congestive) heart failure: Secondary | ICD-10-CM | POA: Diagnosis not present

## 2022-07-12 LAB — ECHOCARDIOGRAM COMPLETE
Area-P 1/2: 4.49 cm2
S' Lateral: 3.2 cm

## 2022-07-30 ENCOUNTER — Ambulatory Visit: Payer: Medicare Other | Attending: Cardiovascular Disease | Admitting: Pharmacist

## 2022-07-30 DIAGNOSIS — E782 Mixed hyperlipidemia: Secondary | ICD-10-CM

## 2022-07-30 MED ORDER — PRAVASTATIN SODIUM 40 MG PO TABS: 40.0000 mg | ORAL_TABLET | Freq: Every evening | ORAL | 1 refills | Status: DC

## 2022-07-30 NOTE — Progress Notes (Signed)
Patient ID: Dennis Roberts                 DOB: 1945-12-23                    MRN: [id number]     HPI: Dennis Roberts is a 77 y.o. male patient referred to lipid clinic by Dr Marlou Porch. PMH is significant for CAD s/p CABG 09/2019, brief post op afib, CHF with EF 45-50% 07/2022, and ICM. Pt reported aches on rosuvastatin 20mg  daily at his last MD appt 06/2022 and he was referred to PharmD for further management.  Pt presents today for follow up. Reports experiencing joint pain on rosuvastatin 20mg  daily. He stopped the med, then restarted at lower dose of 10mg  daily. Didn't tolerate half tablet better either, noticed similar joint pain after about 6 months of therapy.  Current Medications: none Intolerances: rosuvastatin 10-20mg  daily - joint pain Risk Factors: CABG LDL goal: 70mg /dL  Diet: avoids fried food and red meat  Exercise: Uses stationary bicycle daily  Family History: Heart disease in his father, cancer in his mother and father.  Social History: Former tobacco use 1 PPD for 6 years, quit in 1974. 12 beers per week, occasional marijuana use.  Labs: 11/22/20: TC 104, TG 81, HDL 47, LDL 41 (rosuvastatin 20mg  daily)  Past Medical History:  Diagnosis Date   CHF (congestive heart failure) (HCC)    Coronary artery disease     Current Outpatient Medications on File Prior to Visit  Medication Sig Dispense Refill   aspirin EC 81 MG tablet Take 81 mg by mouth daily. Swallow whole.     carvedilol (COREG) 3.125 MG tablet TAKE 1 TABLET BY MOUTH TWICE  DAILY 180 tablet 3   cyanocobalamin (VITAMIN B12) 500 MCG tablet Take 500 mcg by mouth daily.     furosemide (LASIX) 40 MG tablet Take 1 tablet (40 mg total) by mouth daily as needed (for weight gain of 2 pounds or more in 24 hours). 15 tablet 1   lactobacillus acidophilus (BACID) TABS tablet Take 1 tablet by mouth daily.      Multiple Vitamin (MULTIVITAMIN) capsule Take 1 capsule by mouth daily.     OVER THE COUNTER MEDICATION Apply 1  application topically daily as needed (pain). CBD cream     potassium chloride SA (KLOR-CON) 20 MEQ tablet Take 1 tablet (20 mEq total) by mouth as needed. With every dose of lasix 90 tablet 3   sacubitril-valsartan (ENTRESTO) 24-26 MG Take 1 tablet by mouth 2 (two) times daily. 60 tablet 0   spironolactone (ALDACTONE) 25 MG tablet Take 0.5 tablets (12.5 mg total) by mouth daily. Please call to schedule an overdue appointment with Dr. Marlou Porch for refills, [phone], thank you.FINAL ATTEMPT, NO ADDTL REFILLS 7 tablet 0   No current facility-administered medications on file prior to visit.    Allergies  Allergen Reactions   Penicillins    Sulfa Antibiotics     Assessment/Plan:  1. Hyperlipidemia - LDL previously 41 on rosuvastatin 20mg  daily at goal < 70 given prior hx of CABG. He is intolerant to rosuvastatin 10-20mg  daily. Will rechallenge with pravastatin 40mg  daily, ideally better tolerated than atorvastatin challenge would be. Advised pt if he develops joint pain, to cut his tablet in half and take pravastatin 20mg  daily. Will recheck lipids in 6 weeks.   Masen Salvas E. Hoyle Barkdull, PharmD, BCACP, [facility]. [address] [place], [place] [address] Phone: [phone]; Fax: [phone] 07/30/2022 2:59 PM

## 2022-07-30 NOTE — Patient Instructions (Signed)
Your LDL cholesterol goal is < 70  Start taking pravastatin 40mg  - 1 tablet daily  If you develop any joint pain, cut your tablets in half and take 20mg  daily  Recheck fasting labs on Monday, February 26th any time after 7:30am  Call Jinny Blossom, PharmD with any concerns [phone]

## 2022-08-19 ENCOUNTER — Other Ambulatory Visit: Payer: Self-pay | Admitting: Cardiology

## 2022-08-21 ENCOUNTER — Telehealth: Payer: Self-pay | Admitting: Pharmacist

## 2022-08-21 NOTE — Telephone Encounter (Signed)
Patient called in reporting that he cannot take pravastatin.  His legs are very achy.  He stopped taking it a few days ago and is starting to feel better.  He has only been taking pravastatin 20 mg daily instead of 40.  He says it is worse than when he was on Crestor.  We discussed options of trialing another statin versus nonstatin options.  He does have rosuvastatin left and states that he is already tried to see if the quarter a 20 mg tablet which she says he could.  He is willing to try rosuvastatin 5 mg 3 times a week.  I have pushed out his labs until the end of March.  Patient will call with any issues.

## 2022-09-03 ENCOUNTER — Other Ambulatory Visit: Payer: Medicare Other

## 2022-10-02 ENCOUNTER — Ambulatory Visit: Payer: Medicare Other | Attending: Cardiology

## 2022-10-02 DIAGNOSIS — E782 Mixed hyperlipidemia: Secondary | ICD-10-CM | POA: Diagnosis not present

## 2022-10-03 ENCOUNTER — Telehealth: Payer: Self-pay | Admitting: Pharmacist

## 2022-10-03 LAB — HEPATIC FUNCTION PANEL
ALT: 16 IU/L (ref 0–44)
AST: 16 IU/L (ref 0–40)
Albumin: 4.5 g/dL (ref 3.8–4.8)
Alkaline Phosphatase: 85 IU/L (ref 44–121)
Bilirubin Total: 0.9 mg/dL (ref 0.0–1.2)
Bilirubin, Direct: 0.26 mg/dL (ref 0.00–0.40)
Total Protein: 6.8 g/dL (ref 6.0–8.5)

## 2022-10-03 LAB — LIPID PANEL
Chol/HDL Ratio: 3 ratio (ref 0.0–5.0)
Cholesterol, Total: 128 mg/dL (ref 100–199)
HDL: 42 mg/dL (ref >39)
LDL Chol Calc (NIH): 64 mg/dL (ref 0–99)
Triglycerides: 123 mg/dL (ref 0–149)
VLDL Cholesterol Cal: 22 mg/dL (ref 5–40)

## 2022-10-03 MED ORDER — ROSUVASTATIN CALCIUM 5 MG PO TABS: 5.0000 mg | ORAL_TABLET | ORAL | 3 refills | Status: DC

## 2022-10-03 NOTE — Telephone Encounter (Signed)
Pt previously intolerant to pravastatin 20mg  daily and rosuvastatin 10-20mg  daily (joint pain). Recently started on rosuvastatin 5mg  3x per week, excellent lipids with LDL at goal < 70. Spoke with pt who is tolerating med well and is aware to continue current dose. Refill sent in to mail order per his request.

## 2022-11-26 ENCOUNTER — Other Ambulatory Visit: Payer: Self-pay

## 2022-11-26 DIAGNOSIS — D696 Thrombocytopenia, unspecified: Secondary | ICD-10-CM

## 2022-11-27 ENCOUNTER — Inpatient Hospital Stay: Payer: Medicare Other | Attending: Hematology

## 2022-11-27 DIAGNOSIS — D696 Thrombocytopenia, unspecified: Secondary | ICD-10-CM | POA: Diagnosis not present

## 2022-11-27 LAB — CBC WITH DIFFERENTIAL (CANCER CENTER ONLY)
Abs Immature Granulocytes: 0.01 10*3/uL (ref 0.00–0.07)
Basophils Absolute: 0 10*3/uL (ref 0.0–0.1)
Basophils Relative: 1 %
Eosinophils Absolute: 0.1 10*3/uL (ref 0.0–0.5)
Eosinophils Relative: 2 %
HCT: 38 % — ABNORMAL LOW (ref 39.0–52.0)
Hemoglobin: 12.8 g/dL — ABNORMAL LOW (ref 13.0–17.0)
Immature Granulocytes: 0 %
Lymphocytes Relative: 36 %
Lymphs Abs: 1.9 10*3/uL (ref 0.7–4.0)
MCH: 31.6 pg (ref 26.0–34.0)
MCHC: 33.7 g/dL (ref 30.0–36.0)
MCV: 93.8 fL (ref 80.0–100.0)
Monocytes Absolute: 0.4 10*3/uL (ref 0.1–1.0)
Monocytes Relative: 7 %
Neutro Abs: 2.8 10*3/uL (ref 1.7–7.7)
Neutrophils Relative %: 54 %
Platelet Count: 85 10*3/uL — ABNORMAL LOW (ref 150–400)
RBC: 4.05 MIL/uL — ABNORMAL LOW (ref 4.22–5.81)
RDW: 13.6 % (ref 11.5–15.5)
WBC Count: 5.2 10*3/uL (ref 4.0–10.5)
nRBC: 0 % (ref 0.0–0.2)

## 2022-12-04 ENCOUNTER — Other Ambulatory Visit: Payer: Self-pay | Admitting: Nurse Practitioner

## 2022-12-04 DIAGNOSIS — D696 Thrombocytopenia, unspecified: Secondary | ICD-10-CM

## 2023-02-28 ENCOUNTER — Other Ambulatory Visit: Payer: Self-pay | Admitting: Cardiology

## 2023-04-30 ENCOUNTER — Other Ambulatory Visit: Payer: Self-pay | Admitting: Cardiology

## 2023-05-29 DIAGNOSIS — D696 Thrombocytopenia, unspecified: Secondary | ICD-10-CM | POA: Insufficient documentation

## 2023-05-29 NOTE — Assessment & Plan Note (Signed)
Thrombocytopenia -initially seen 02/2021, he does not have historical labs to compare, but appears he had normal plt count 09/18/19 at presentation to ED for acute heart failure, and developed thrombocytopenia and anemia in the hospital. Lowest plt 46K.  -anemia and thrombocytopenia resolved after hospitalization, but he developed recurrent and worsened thrombocytopenia in 08/2020 -work up showed normal B12, MMA, folate, immature plt count, and no liver/spleen disease on abdominal US. This is likely ITP -He started oral B12 500 mcg daily -Mr. Stencil appears stable.  Denies recent bruising/bleeding, no petechiae on exam.  Today CBC shows platelet 126K -does not require treatment unless plt <50K and/or severe bleeding  -we reviewed plt can drop during acute illness. He will call if he has planned procedures -Given the improvement, will repeat CBC in 6 and 12 months, and follow-up in 1 year, or sooner if needed

## 2023-05-29 NOTE — Progress Notes (Unsigned)
Patient Care Team: Patient, No Pcp Per as PCP - General (General Practice) Jake Bathe, MD as PCP - Cardiology (Cardiology) Malachy Mood, MD as Consulting Physician (Hematology and Oncology) Pollyann Samples, NP as Nurse Practitioner (Hematology)  Clinic Day:  05/30/2023  Referring physician: No ref. provider found  ASSESSMENT & PLAN:   Assessment & Plan: Thrombocytopenia (HCC) Thrombocytopenia -initially seen 02/2021, he does not have historical labs to compare, but appears he had normal plt count 09/18/19 at presentation to ED for acute heart failure, and developed thrombocytopenia and anemia in the hospital. Lowest plt 46K.  -anemia and thrombocytopenia resolved after hospitalization, but he developed recurrent and worsened thrombocytopenia in 08/2020 -work up showed normal B12, MMA, folate, immature plt count, and no liver/spleen disease on abdominal US. This is likely ITP -He started oral B12 500 mcg daily -Mr. Roberts appears stable.  Denies recent bruising/bleeding, no petechiae on exam.  Today CBC shows platelet 126K -does not require treatment unless plt <50K and/or severe bleeding  -we reviewed plt can drop during acute illness. He will call if he has planned procedures -Given the improvement, will repeat CBC in 6 and 12 months, and follow-up in 1 year, or sooner if needed    Plan: Labs reviewed  -CBC showing WBC 5.1; Hgb 14.8; Hct 41.7; Plt 97; Anc 2.9 -B12 477 No changes in medical history and patient is clinically stable.  Labs improved. Recheck labs in 6 months. Labs with follow-up in 12 months.  The patient understands the plans discussed today and is in agreement with them.  He knows to contact our office if he develops concerns prior to his next appointment.  I provided 25 minutes of face-to-face time during this encounter and > 50% was spent counseling as documented under my assessment and plan.    Carlean Jews, NP   [facility] - [facility] DE[facility] [address] [place] [address] Dept: [phone] Dept Fax: [phone]   No orders of the defined types were placed in this encounter.     CHIEF COMPLAINT:  CC: thrombocytopenia  Current Treatment:  surveillance and oral B12 mcg daily  INTERVAL HISTORY:  Dennis Roberts is here today for repeat clinical assessment. He was last seen by Lacie on 05/29/2022. His last CBC done 11/27/2022 showed Hgb 12.8; Hct 38.0; and platelets 85. Today, Hgb 14.8, Hct 41.7, and platelets are 97. B12 level is 477.  He states he takes B12 supplement daily.  Denies epistaxis.  Denies hematuria.  Denies blood in stool.  He denies fevers or chills. He denies pain. His appetite is good. His weight has increased 5 pounds over last 12 months .  I have reviewed the past medical history, past surgical history, social history and family history with the patient and they are unchanged from previous note.  ALLERGIES:  is allergic to penicillins, rosuvastatin, and sulfa antibiotics.  MEDICATIONS:  Current Outpatient Medications  Medication Sig Dispense Refill   aspirin EC 81 MG tablet Take 81 mg by mouth daily. Swallow whole.     carvedilol (COREG) 3.125 MG tablet TAKE 1 TABLET BY MOUTH TWICE  DAILY 180 tablet 3   cyanocobalamin (VITAMIN B12) 500 MCG tablet Take 500 mcg by mouth daily.     lactobacillus acidophilus (BACID) TABS tablet Take 1 tablet by mouth daily.      Multiple Vitamin (MULTIVITAMIN) capsule Take 1 capsule by mouth daily.     OVER  THE COUNTER MEDICATION Apply 1 application topically daily as needed (pain). CBD cream     potassium chloride SA (KLOR-CON) 20 MEQ tablet Take 1 tablet (20 mEq total) by mouth as needed. With every dose of lasix 90 tablet 3   rosuvastatin (CRESTOR) 5 MG tablet Take 1 tablet (5 mg total) by mouth 3 (three) times a week. 36 tablet 3   sacubitril-valsartan (ENTRESTO) 24-26 MG Take 1 tablet by mouth 2 (two) times daily.  60 tablet 0   spironolactone (ALDACTONE) 25 MG tablet TAKE ONE-HALF TABLET BY MOUTH  DAILY 30 tablet 1   furosemide (LASIX) 40 MG tablet Take 1 tablet (40 mg total) by mouth daily as needed (for weight gain of 2 pounds or more in 24 hours). 15 tablet 1   No current facility-administered medications for this visit.       REVIEW OF SYSTEMS:   Constitutional: Denies fevers, chills or abnormal weight loss Eyes: Denies blurriness of vision Ears, nose, mouth, throat, and face: Denies mucositis or sore throat Respiratory: Denies cough, dyspnea or wheezes Cardiovascular: Denies palpitation, chest discomfort or lower extremity swelling Gastrointestinal:  Denies nausea, heartburn or change in bowel habits Skin: Denies abnormal skin rashes Lymphatics: Denies new lymphadenopathy or easy bruising Neurological:Denies numbness, tingling or new weaknesses Behavioral/Psych: Mood is stable, no new changes  All other systems were reviewed with the patient and are negative.   VITALS:   Today's Vitals   05/30/23 1026  BP: 128/70  Pulse: 74  Resp: 17  Temp: 97.6 F (36.4 C)  TempSrc: Oral  SpO2: 100%  Weight: 166 lb 12.8 oz (75.7 kg)   Body mass index is 27.76 kg/m.   Wt Readings from Last 3 Encounters:  05/30/23 166 lb 12.8 oz (75.7 kg)  06/14/22 161 lb (73 kg)  05/29/22 163 lb 8 oz (74.2 kg)    Body mass index is 27.76 kg/m.  Performance status (ECOG): 1 - Symptomatic but completely ambulatory  PHYSICAL EXAM:   GENERAL:alert, no distress and comfortable SKIN: skin color, texture, turgor are normal, no rashes or significant lesions EYES: normal, Conjunctiva are pink and non-injected, sclera clear OROPHARYNX:no exudate, no erythema and lips, buccal mucosa, and tongue normal  NECK: supple, thyroid normal size, non-tender, without nodularity LYMPH:  no palpable lymphadenopathy in the cervical, axillary or inguinal LUNGS: clear to auscultation and percussion with normal breathing  effort HEART: regular rate & rhythm and no murmurs and no lower extremity edema ABDOMEN:abdomen soft, non-tender and normal bowel sounds Musculoskeletal:no cyanosis of digits and no clubbing  NEURO: alert & oriented x 3 with fluent speech, no focal motor/sensory deficits  LABORATORY DATA:  I have reviewed the data as listed    Component Value Date/Time   NA 139 02/17/2021 0911   K 4.2 02/17/2021 0911   CL 104 02/17/2021 0911   CO2 23 02/17/2021 0911   GLUCOSE 101 (H) 02/17/2021 0911   GLUCOSE 101 (H) 04/13/2020 1453   BUN 13 02/17/2021 0911   CREATININE 0.84 02/17/2021 0911   CALCIUM 9.4 02/17/2021 0911   PROT 6.8 10/02/2022 0817   ALBUMIN 4.5 10/02/2022 0817   AST 16 10/02/2022 0817   ALT 16 10/02/2022 0817   ALKPHOS 85 10/02/2022 0817   BILITOT 0.9 10/02/2022 0817   GFRNONAA 95 08/09/2020 1422   GFRAA 109 08/09/2020 1422     Lab Results  Component Value Date   WBC 5.1 05/30/2023   NEUTROABS 2.9 05/30/2023   HGB 14.5 05/30/2023   HCT 41.7  05/30/2023   MCV 93.3 05/30/2023   PLT 97 (L) 05/30/2023

## 2023-05-30 ENCOUNTER — Encounter: Payer: Self-pay | Admitting: Nurse Practitioner

## 2023-05-30 ENCOUNTER — Inpatient Hospital Stay: Payer: Medicare Other | Attending: Nurse Practitioner

## 2023-05-30 ENCOUNTER — Inpatient Hospital Stay: Payer: Medicare Other | Admitting: Nurse Practitioner

## 2023-05-30 VITALS — BP 128/70 | HR 74 | Temp 97.6°F | Resp 17 | Wt 166.8 lb

## 2023-05-30 DIAGNOSIS — D696 Thrombocytopenia, unspecified: Secondary | ICD-10-CM

## 2023-05-30 LAB — CBC WITH DIFFERENTIAL (CANCER CENTER ONLY)
Abs Immature Granulocytes: 0.02 10*3/uL (ref 0.00–0.07)
Basophils Absolute: 0 10*3/uL (ref 0.0–0.1)
Basophils Relative: 1 %
Eosinophils Absolute: 0.1 10*3/uL (ref 0.0–0.5)
Eosinophils Relative: 1 %
HCT: 41.7 % (ref 39.0–52.0)
Hemoglobin: 14.5 g/dL (ref 13.0–17.0)
Immature Granulocytes: 0 %
Lymphocytes Relative: 33 %
Lymphs Abs: 1.7 10*3/uL (ref 0.7–4.0)
MCH: 32.4 pg (ref 26.0–34.0)
MCHC: 34.8 g/dL (ref 30.0–36.0)
MCV: 93.3 fL (ref 80.0–100.0)
Monocytes Absolute: 0.4 10*3/uL (ref 0.1–1.0)
Monocytes Relative: 8 %
Neutro Abs: 2.9 10*3/uL (ref 1.7–7.7)
Neutrophils Relative %: 57 %
Platelet Count: 97 10*3/uL — ABNORMAL LOW (ref 150–400)
RBC: 4.47 MIL/uL (ref 4.22–5.81)
RDW: 13.7 % (ref 11.5–15.5)
WBC Count: 5.1 10*3/uL (ref 4.0–10.5)
nRBC: 0 % (ref 0.0–0.2)

## 2023-05-30 LAB — VITAMIN B12: Vitamin B-12: 477 pg/mL (ref 180–914)

## 2023-07-04 ENCOUNTER — Encounter: Payer: Self-pay | Admitting: Cardiology

## 2023-07-04 ENCOUNTER — Telehealth: Payer: Self-pay

## 2023-07-04 ENCOUNTER — Other Ambulatory Visit: Payer: Self-pay | Admitting: *Deleted

## 2023-07-04 ENCOUNTER — Ambulatory Visit: Payer: Medicare Other | Attending: Cardiology | Admitting: Cardiology

## 2023-07-04 VITALS — BP 118/78 | HR 78 | Ht 65.0 in | Wt 164.6 lb

## 2023-07-04 DIAGNOSIS — Z951 Presence of aortocoronary bypass graft: Secondary | ICD-10-CM | POA: Diagnosis not present

## 2023-07-04 DIAGNOSIS — I5022 Chronic systolic (congestive) heart failure: Secondary | ICD-10-CM

## 2023-07-04 DIAGNOSIS — I2510 Atherosclerotic heart disease of native coronary artery without angina pectoris: Secondary | ICD-10-CM

## 2023-07-04 DIAGNOSIS — Z789 Other specified health status: Secondary | ICD-10-CM | POA: Diagnosis not present

## 2023-07-04 DIAGNOSIS — E782 Mixed hyperlipidemia: Secondary | ICD-10-CM | POA: Diagnosis not present

## 2023-07-04 NOTE — Telephone Encounter (Signed)
Came across app on fax.  PAP: Application for Sherryll Burger has been submitted to PAP Companies: [employer], via fax. If patient requests an update in the meantime, please refer them to [employer] at [phone].

## 2023-07-04 NOTE — Telephone Encounter (Signed)
PAP: Patient has been denied for pt assistance by PAP Companies: [employer] due to patient below income threshold for program, needs to apply for Extra Help Low Income Subsidy (LIS) Letter has been sent to patient.

## 2023-07-04 NOTE — Progress Notes (Signed)
Cardiology Office Note:  .   Date:  07/04/2023  ID:  [name], DOB Sep 27, 1945, MRN [id number] PCP: Patient, No Pcp Per  [facility] Providers Cardiologist:  Donato Schultz, MD     History of Present Illness: .   Dennis Roberts is a 77 y.o. male Discussed with the use of AI scribe   History of Present Illness   The patient, a 77 year old with a history of coronary artery disease status post CABG in March 2021, presented for a routine follow-up.    The patient reported feeling well, with no complaints of chest pain, shortness of breath, or bleeding problems. He has been able to maintain an active lifestyle, including yard work and regular exercise on a stationary bike.  The patient had been taking rosuvastatin 5 mg three times a week, with the last LDL reported at 64. However, he experienced some joint pain, particularly in the shoulders, which seemed to improve after starting CoQ10 supplementation on the advice of a healthcare provider at the cancer center.  The patient also reported no issues with swelling and has been adhering to a medication regimen that includes Entresto, a low dose of carvedilol, and spironolactone. Despite slightly low platelet levels, the patient has been continuing with low-dose aspirin due to the bypass surgery, with no reported bleeding issues.            Studies Reviewed: Marland Kitchen   EKG Interpretation Date/Time:  Thursday July 04 2023 09:30:57 EST Ventricular Rate:  73 PR Interval:  214 QRS Duration:  102 QT Interval:  406 QTC Calculation: 447 R Axis:   -18  Text Interpretation: Sinus rhythm with 1st degree A-V block Poor R wave progression When compared with ECG of 08-Oct-2019 09:41, QRS axis Shifted left Nonspecific T wave abnormality no longer evident in Inferior leads Confirmed by Donato Schultz ([id number]) on 07/04/2023 9:48:09 AM    Results   LABS LDL: 64 Creatinine: 0.8 HbA1c: 4.9 Hb: 14.5 PLT: 97  DIAGNOSTIC Echocardiogram: EF 45-50%  (07/28/2022)     Risk Assessment/Calculations:            Physical Exam:   VS:  BP 118/78 (BP Location: Left Arm, Patient Position: Sitting)   Pulse 78   Ht 5\' 5"  (1.651 m)   Wt 164 lb 9.6 oz (74.7 kg)   SpO2 99%   BMI 27.39 kg/m    Wt Readings from Last 3 Encounters:  07/04/23 164 lb 9.6 oz (74.7 kg)  05/30/23 166 lb 12.8 oz (75.7 kg)  06/14/22 161 lb (73 kg)    GEN: Well nourished, well developed in no acute distress NECK: No JVD; No carotid bruits CARDIAC: CABG scar RRR, no murmurs, no rubs, no gallops RESPIRATORY:  Clear to auscultation without rales, wheezing or rhonchi  ABDOMEN: Soft, non-tender, non-distended EXTREMITIES:  No edema; No deformity   ASSESSMENT AND PLAN: .    Assessment and Plan    Coronary Artery Disease, Status Post CABG 77 year old with coronary artery disease, status post CABG in March 2021 (LIMA to LAD, SVG to diagonal, SVG to OM1 and OM2). Postoperative atrial fibrillation resolved. Currently asymptomatic (NYHA class I). Echocardiogram (07/2022) shows EF 45-50%, stable and improved post-surgery. Last LDL 64. Medications: rosuvastatin 5 mg three times a week, Entresto 24/26 mg twice a day, carvedilol 3.125 mg twice a day, spironolactone 12.5 mg once a day, CoQ10 for statin-induced joint pain. Low-dose aspirin continued despite low platelets (97) due to bypass history. Hemoglobin A1c 4.9, hemoglobin 14.5, creatinine  0.8, indicating good glycemic control and normal kidney function. - Continue current medications - Maintain low-dose aspirin - Assist with [employer] patient assistance program paperwork for Entresto - Follow up in one year  General Health Maintenance Maintaining a healthy lifestyle with regular exercise (stationary bike five times a week, three miles per session). - Continue regular exercise regimen  Follow-up - Schedule follow-up appointment in one year - Reach out if any changes in symptoms occur.               Signed, Donato Schultz, MD

## 2023-07-04 NOTE — Patient Instructions (Signed)
Medication Instructions:  The current medical regimen is effective;  continue present plan and medications.  *If you need a refill on your cardiac medications before your next appointment, please call your pharmacy*  Follow-Up: At [facility], you and your health needs are our priority.  As part of our continuing mission to provide you with exceptional heart care, we have created designated Provider Care Teams.  These Care Teams include your primary Cardiologist (physician) and Advanced Practice Providers (APPs -  Physician Assistants and Nurse Practitioners) who all work together to provide you with the care you need, when you need it.  We recommend signing up for the patient portal called "MyChart".  Sign up information is provided on this After Visit Summary.  MyChart is used to connect with patients for Virtual Visits (Telemedicine).  Patients are able to view lab/test results, encounter notes, upcoming appointments, etc.  Non-urgent messages can be sent to your provider as well.   To learn more about what you can do with MyChart, go to [email or web address].    Your next appointment:   1 year(s)  Provider:   Jari Favre, PA-C, Robin Searing, NP, Jacolyn Reedy, PA-C, Eligha Bridegroom, NP, Tereso Newcomer, PA-C, or Perlie Gold, PA-C

## 2023-07-05 ENCOUNTER — Other Ambulatory Visit: Payer: Self-pay | Admitting: Cardiology

## 2023-08-02 NOTE — Telephone Encounter (Signed)
PAP: Patient assistance application for Dennis Roberts has been approved by PAP Companies: [employer] from 08/02/23 to 07/08/24. Medication should be delivered to PAP Delivery: Home. For further shipping updates, please contact [employer] at [phone]. Patient ID is: [id number]

## 2023-11-10 ENCOUNTER — Other Ambulatory Visit: Payer: Self-pay | Admitting: Cardiology

## 2023-11-17 ENCOUNTER — Other Ambulatory Visit: Payer: Self-pay | Admitting: Cardiology

## 2023-11-27 ENCOUNTER — Inpatient Hospital Stay: Payer: Medicare Other | Attending: Nurse Practitioner

## 2023-11-27 DIAGNOSIS — D696 Thrombocytopenia, unspecified: Secondary | ICD-10-CM | POA: Insufficient documentation

## 2023-11-27 LAB — VITAMIN B12: Vitamin B-12: 505 pg/mL (ref 180–914)

## 2024-01-19 ENCOUNTER — Other Ambulatory Visit: Payer: Self-pay | Admitting: Cardiology

## 2024-01-22 ENCOUNTER — Telehealth: Payer: Self-pay | Admitting: Cardiology

## 2024-01-22 MED ORDER — ROSUVASTATIN CALCIUM 5 MG PO TABS: 5.0000 mg | ORAL_TABLET | ORAL | 1 refills | Status: DC

## 2024-01-22 NOTE — Telephone Encounter (Signed)
*  STAT* If patient is at the pharmacy, call can be transferred to refill team.   1. Which medications need to be refilled? (please list name of each medication and dose if known) rosuvastatin  (CRESTOR ) 5 MG tablet    2. Would you like to learn more about the convenience, safety, & potential cost savings by using the [facility]?   3. Are you open to using the [facility] (Type [facility]. ).   4. Which pharmacy/location (including street and city if local pharmacy) is medication to be sent to? [facility] - [place], [place] - [address]    5. Do they need a 30 day or 90 day supply? 90 day

## 2024-01-22 NOTE — Telephone Encounter (Signed)
 Pt's medication was sent to pt's pharmacy as requested. Confirmation received.

## 2024-03-28 ENCOUNTER — Other Ambulatory Visit: Payer: Self-pay | Admitting: Cardiology

## 2024-05-28 ENCOUNTER — Other Ambulatory Visit: Payer: Self-pay

## 2024-05-28 DIAGNOSIS — D696 Thrombocytopenia, unspecified: Secondary | ICD-10-CM

## 2024-05-28 NOTE — Progress Notes (Signed)
 Patient Care Team: Patient, No Pcp Per as PCP - General (General Practice) Jeffrie Oneil BROCKS, MD as PCP - Cardiology (Cardiology) Lanny Callander, MD as Consulting Physician (Hematology and Oncology) Burton, Lacie K, NP as Nurse Practitioner (Hematology)  Clinic Day:  05/29/2024  Referring physician: No ref. provider found  ASSESSMENT & PLAN:   Assessment & Plan: Thrombocytopenia Thrombocytopenia -initially seen 02/2021, he does not have historical labs to compare, but appears he had normal plt count 09/18/19 at presentation to ED for acute heart failure, and developed thrombocytopenia and anemia in the hospital. Lowest plt 46K.  -anemia and thrombocytopenia resolved after hospitalization, but he developed recurrent and worsened thrombocytopenia in 08/2020 -work up showed normal B12, MMA, folate, immature plt count, and no liver/spleen disease on abdominal US . This is likely ITP -He started oral B12 500 mcg daily -Mr. Keeter appears stable.  Denies recent bruising/bleeding, no petechiae on exam.  Today CBC shows platelet 126K -does not require treatment unless plt <50K and/or severe bleeding  -we reviewed plt can drop during acute illness. He will call if he has planned procedures -Given the improvement, will repeat CBC in 6 and 12 months, and follow-up in 1 year, or sooner if needed   Plan  Labs reviewed.  -platelets stable with slight improvement at 102. Ily. -vitamin b12 normal.  Continue vitamin b12 daily.  Check labs in 6 months and follow up in 1 year with labs.   The patient understands the plans discussed today and is in agreement with them.  He knows to contact our office if he develops concerns prior to his next appointment.  I provided 20 minutes of face-to-face time during this encounter and > 50% was spent counseling as documented under my assessment and plan.    Powell FORBES Lessen, NP  [place] [facility] - A DEPT OF [facility] [address] [place] [address] Dept: [phone] Dept Fax: [phone]   No orders of the defined types were placed in this encounter.     CHIEF COMPLAINT:  CC: Thrombocytopenia  Current Treatment: Oral B12 daily  INTERVAL HISTORY:  Terrian is here today for repeat clinical assessment.  He last saw me on 05/30/2023.  He continues to feel well with no concerns or complaints today. He denies chest pain, chest pressure, or shortness of breath. He denies headaches or visual disturbances. He denies abdominal pain, nausea, vomiting, or changes in bowel or bladder habits.  He denies fevers or chills. He denies pain. His appetite is good. His weight has decreased 4 pounds over last year.  I have reviewed the past medical history, past surgical history, social history and family history with the patient and they are unchanged from previous note.  ALLERGIES:  is allergic to penicillins, rosuvastatin , and sulfa antibiotics.  MEDICATIONS:  Current Outpatient Medications  Medication Sig Dispense Refill   aspirin  EC 81 MG tablet Take 81 mg by mouth daily. Swallow whole.     carvedilol  (COREG ) 3.125 MG tablet Take 1 tablet (3.125 mg total) by mouth 2 (two) times daily. Pt needs to keep upcoming appt in Dec for further refills 180 tablet 0   co-enzyme Q-10 30 MG capsule Take 100 mg by mouth daily.     cyanocobalamin  (VITAMIN B12) 500 MCG tablet Take 500 mcg by mouth daily.     furosemide  (LASIX ) 40 MG tablet Take 1 tablet (40 mg total) by mouth daily as needed (for weight  gain of 2 pounds or more in 24 hours). 15 tablet 1   lactobacillus acidophilus (BACID) TABS tablet Take 1 tablet by mouth daily.      Multiple Vitamin (MULTIVITAMIN) capsule Take 1 capsule by mouth daily.     potassium chloride  SA (KLOR-CON ) 20 MEQ tablet Take 1 tablet (20 mEq total) by mouth as needed. With every dose of lasix  90 tablet 3   rosuvastatin  (CRESTOR ) 5 MG tablet Take 1 tablet (5 mg total) by mouth 3  (three) times a week. 45 tablet 1   sacubitril -valsartan  (ENTRESTO ) 24-26 MG Take 1 tablet by mouth 2 (two) times daily. 60 tablet 0   spironolactone  (ALDACTONE ) 25 MG tablet Take 0.5 tablets (12.5 mg total) by mouth daily. Pt needs to keep upcoming appt in Dec for further refills 45 tablet 0   No current facility-administered medications for this visit.     REVIEW OF SYSTEMS:   Constitutional: Denies fevers, chills or abnormal weight loss Eyes: Denies blurriness of vision Ears, nose, mouth, throat, and face: Denies mucositis or sore throat Respiratory: Denies cough, dyspnea or wheezes Cardiovascular: Denies palpitation, chest discomfort or lower extremity swelling Gastrointestinal:  Denies nausea, heartburn or change in bowel habits Skin: Denies abnormal skin rashes Lymphatics: Denies new lymphadenopathy or easy bruising Neurological:Denies numbness, tingling or new weaknesses Behavioral/Psych: Mood is stable, no new changes  All other systems were reviewed with the patient and are negative.   VITALS:   Today's Vitals   05/29/24 1107 05/29/24 1109  BP: 108/68   Pulse: 66   Resp: 17   Temp: 98 F (36.7 C)   SpO2: 98%   Weight: 160 lb 11.2 oz (72.9 kg)   PainSc:  0-No pain   Body mass index is 26.74 kg/m.    Wt Readings from Last 3 Encounters:  05/29/24 160 lb 11.2 oz (72.9 kg)  07/04/23 164 lb 9.6 oz (74.7 kg)  05/30/23 166 lb 12.8 oz (75.7 kg)    Body mass index is 26.74 kg/m.  Performance status (ECOG): 0 - Asymptomatic  PHYSICAL EXAM:   GENERAL:alert, no distress and comfortable SKIN: skin color, texture, turgor are normal, no rashes or significant lesions EYES: normal, Conjunctiva are pink and non-injected, sclera clear OROPHARYNX:no exudate, no erythema and lips, buccal mucosa, and tongue normal  NECK: supple, thyroid normal size, non-tender, without nodularity LYMPH:  no palpable lymphadenopathy in the cervical, axillary or inguinal LUNGS: clear to  auscultation and percussion with normal breathing effort HEART: regular rate & rhythm and no murmurs and no lower extremity edema ABDOMEN:abdomen soft, non-tender and normal bowel sounds Musculoskeletal:no cyanosis of digits and no clubbing  NEURO: alert & oriented x 3 with fluent speech, no focal motor/sensory deficits  LABORATORY DATA:  I have reviewed the data as listed    Component Value Date/Time   NA 139 02/17/2021 0911   K 4.2 02/17/2021 0911   CL 104 02/17/2021 0911   CO2 23 02/17/2021 0911   GLUCOSE 101 (H) 02/17/2021 0911   GLUCOSE 101 (H) 04/13/2020 1453   BUN 13 02/17/2021 0911   CREATININE 0.84 02/17/2021 0911   CALCIUM  9.4 02/17/2021 0911   PROT 6.8 10/02/2022 0817   ALBUMIN  4.5 10/02/2022 0817   AST 16 10/02/2022 0817   ALT 16 10/02/2022 0817   ALKPHOS 85 10/02/2022 0817   BILITOT 0.9 10/02/2022 0817   GFRNONAA 95 08/09/2020 1422   GFRAA 109 08/09/2020 1422     Lab Results  Component Value Date   WBC  5.7 05/29/2024   NEUTROABS 3.2 05/29/2024   HGB 13.5 05/29/2024   HCT 39.0 05/29/2024   MCV 93.1 05/29/2024   PLT 102 (L) 05/29/2024

## 2024-05-29 ENCOUNTER — Inpatient Hospital Stay: Payer: Medicare Other | Attending: Nurse Practitioner

## 2024-05-29 ENCOUNTER — Inpatient Hospital Stay: Payer: Medicare Other | Admitting: Nurse Practitioner

## 2024-05-29 VITALS — BP 108/68 | HR 66 | Temp 98.0°F | Resp 17 | Wt 160.7 lb

## 2024-05-29 DIAGNOSIS — D696 Thrombocytopenia, unspecified: Secondary | ICD-10-CM | POA: Insufficient documentation

## 2024-05-29 LAB — CBC WITH DIFFERENTIAL (CANCER CENTER ONLY)
Abs Immature Granulocytes: 0.01 K/uL (ref 0.00–0.07)
Basophils Absolute: 0 K/uL (ref 0.0–0.1)
Basophils Relative: 1 %
Eosinophils Absolute: 0.1 K/uL (ref 0.0–0.5)
Eosinophils Relative: 2 %
HCT: 39 % (ref 39.0–52.0)
Hemoglobin: 13.5 g/dL (ref 13.0–17.0)
Immature Granulocytes: 0 %
Lymphocytes Relative: 36 %
Lymphs Abs: 2 K/uL (ref 0.7–4.0)
MCH: 32.2 pg (ref 26.0–34.0)
MCHC: 34.6 g/dL (ref 30.0–36.0)
MCV: 93.1 fL (ref 80.0–100.0)
Monocytes Absolute: 0.4 K/uL (ref 0.1–1.0)
Monocytes Relative: 6 %
Neutro Abs: 3.2 K/uL (ref 1.7–7.7)
Neutrophils Relative %: 55 %
Platelet Count: 102 K/uL — ABNORMAL LOW (ref 150–400)
RBC: 4.19 MIL/uL — ABNORMAL LOW (ref 4.22–5.81)
RDW: 13.8 % (ref 11.5–15.5)
WBC Count: 5.7 K/uL (ref 4.0–10.5)
nRBC: 0 % (ref 0.0–0.2)

## 2024-05-29 LAB — VITAMIN B12: Vitamin B-12: 627 pg/mL (ref 180–914)

## 2024-05-29 NOTE — Assessment & Plan Note (Addendum)
 Thrombocytopenia -initially seen 02/2021, he does not have historical labs to compare, but appears he had normal plt count 09/18/19 at presentation to ED for acute heart failure, and developed thrombocytopenia and anemia in the hospital. Lowest plt 46K.  -anemia and thrombocytopenia resolved after hospitalization, but he developed recurrent and worsened thrombocytopenia in 08/2020 -work up showed normal B12, MMA, folate, immature plt count, and no liver/spleen disease on abdominal US. This is likely ITP -He started oral B12 500 mcg daily -Dennis Roberts appears stable.  Denies recent bruising/bleeding, no petechiae on exam.  Today CBC shows platelet 126K -does not require treatment unless plt <50K and/or severe bleeding  -we reviewed plt can drop during acute illness. He will call if he has planned procedures -Given the improvement, will repeat CBC in 6 and 12 months, and follow-up in 1 year, or sooner if needed

## 2024-06-07 ENCOUNTER — Encounter: Payer: Self-pay | Admitting: Nurse Practitioner

## 2024-06-21 ENCOUNTER — Other Ambulatory Visit: Payer: Self-pay | Admitting: Cardiology

## 2024-06-22 NOTE — Progress Notes (Unsigned)
°  Cardiology Office Note   Date:  06/22/2024  ID:  Dennis Roberts, DOB 1945-07-16, MRN [id number] PCP: Patient, No Pcp Per  [facility] Providers Cardiologist:  Oneil Parchment, MD   History of Present Illness Dennis Roberts is a 78 y.o. male with a past medical history of coronary artery disease status post CABG in March 2021 here for follow-up appointment.  Patient reported feeling well with no complaints of chest pain or shortness of breath at his last office visit.  He is able to maintain a active lifestyle including yard work and regular exercise on a stationary bike.  Patient noted taking rosuvastatin  5 mg 3 times a week with last LDL reported at 64.  However, was experiencing some joint pain particularly in his shoulders which seemed to improve after starting co-Q10 supplementation on the advice of a healthcare provider at the cancer center.  He also reported no issues with swelling and had been adhering to a medical regimen that included Entresto , low-dose carvedilol , and spironolactone .  Despite slightly low platelet levels, the patient had been continuing with low-dose aspirin  due to bypass surgery with no reported bleeding issues.  He was last seen a year ago 07/04/2023.  Today, he***  ROS: Pertinent ROS in HPI  Studies Reviewed      *** Risk Assessment/Calculations {Does this patient have ATRIAL FIBRILLATION?:[phone]} No BP recorded.  {Refresh Note OR Click here to enter BP  :1}***       Physical Exam VS:  There were no vitals taken for this visit.       Wt Readings from Last 3 Encounters:  05/29/24 160 lb 11.2 oz (72.9 kg)  07/04/23 164 lb 9.6 oz (74.7 kg)  05/30/23 166 lb 12.8 oz (75.7 kg)    GEN: Well nourished, well developed in no acute distress NECK: No JVD; No carotid bruits CARDIAC: ***RRR, no murmurs, rubs, gallops RESPIRATORY:  Clear to auscultation without rales, wheezing or rhonchi  ABDOMEN: Soft, non-tender, non-distended EXTREMITIES:  No edema; No  deformity   ASSESSMENT AND PLAN Coronary artery disease, post CABG Hypertension Hyperlipidemia    {Are you ordering a CV Procedure (e.g. stress test, cath, DCCV, TEE, etc)?   Press F2        :[id number]}  Dispo: ***  Signed, Dennis LOISE Fabry, PA-C

## 2024-06-29 ENCOUNTER — Ambulatory Visit: Attending: Physician Assistant | Admitting: Physician Assistant

## 2024-06-29 ENCOUNTER — Encounter: Payer: Self-pay | Admitting: Physician Assistant

## 2024-06-29 ENCOUNTER — Ambulatory Visit: Admitting: Physician Assistant

## 2024-06-29 VITALS — BP 122/74 | HR 69 | Ht 65.5 in | Wt 159.0 lb

## 2024-06-29 DIAGNOSIS — I2510 Atherosclerotic heart disease of native coronary artery without angina pectoris: Secondary | ICD-10-CM | POA: Diagnosis not present

## 2024-06-29 DIAGNOSIS — Z951 Presence of aortocoronary bypass graft: Secondary | ICD-10-CM

## 2024-06-29 DIAGNOSIS — E782 Mixed hyperlipidemia: Secondary | ICD-10-CM | POA: Diagnosis not present

## 2024-06-29 DIAGNOSIS — Z789 Other specified health status: Secondary | ICD-10-CM

## 2024-06-29 DIAGNOSIS — I5022 Chronic systolic (congestive) heart failure: Secondary | ICD-10-CM | POA: Diagnosis not present

## 2024-06-29 DIAGNOSIS — I5020 Unspecified systolic (congestive) heart failure: Secondary | ICD-10-CM

## 2024-06-29 LAB — HEPATIC FUNCTION PANEL
ALT: 17 IU/L (ref 0–44)
AST: 20 IU/L (ref 0–40)
Albumin: 4.5 g/dL (ref 3.8–4.8)
Alkaline Phosphatase: 93 IU/L (ref 47–123)
Bilirubin Total: 0.8 mg/dL (ref 0.0–1.2)
Bilirubin, Direct: 0.32 mg/dL (ref 0.00–0.40)
Total Protein: 6.8 g/dL (ref 6.0–8.5)

## 2024-06-29 LAB — LIPID PANEL
Chol/HDL Ratio: 2.6 ratio (ref 0.0–5.0)
Cholesterol, Total: 132 mg/dL (ref 100–199)
HDL: 50 mg/dL
LDL Chol Calc (NIH): 63 mg/dL (ref 0–99)
Triglycerides: 101 mg/dL (ref 0–149)
VLDL Cholesterol Cal: 19 mg/dL (ref 5–40)

## 2024-06-29 NOTE — Patient Instructions (Signed)
 Medication Instructions:  Your physician recommends that you continue on your current medications as directed. Please refer to the Current Medication list given to you today.  *If you need a refill on your cardiac medications before your next appointment, please call your pharmacy*  Lab Work: Fasting Lipid Panel and LFT  If you have labs (blood work) drawn today and your tests are completely normal, you will receive your results only by: MyChart Message (if you have MyChart) OR A paper copy in the mail If you have any lab test that is abnormal or we need to change your treatment, we will call you to review the results.  Testing/Procedures: Your physician has requested that you have an echocardiogram. Echocardiography is a painless test that uses sound waves to create images of your heart. It provides your doctor with information about the size and shape of your heart and how well your hearts chambers and valves are working. This procedure takes approximately one hour. There are no restrictions for this procedure. Please do NOT wear cologne, perfume, aftershave, or lotions (deodorant is allowed). Please arrive 15 minutes prior to your appointment time.  Please note: We ask at that you not bring children with you during ultrasound (echo/ vascular) testing. Due to room size and safety concerns, children are not allowed in the ultrasound rooms during exams. Our front office staff cannot provide observation of children in our lobby area while testing is being conducted. An adult accompanying a patient to their appointment will only be allowed in the ultrasound room at the discretion of the ultrasound technician under special circumstances. We apologize for any inconvenience.   Follow-Up: At [facility], you and your health needs are our priority.  As part of our continuing mission to provide you with exceptional heart care, our providers are all part of one team.  This team includes your  primary Cardiologist (physician) and Advanced Practice Providers or APPs (Physician Assistants and Nurse Practitioners) who all work together to provide you with the care you need, when you need it.  Your next appointment:   1 year(s)  Provider:   Oneil Parchment, MD or Orren Fabry, PA   We recommend signing up for the patient portal called MyChart.  Sign up information is provided on this After Visit Summary.  MyChart is used to connect with patients for Virtual Visits (Telemedicine).  Patients are able to view lab/test results, encounter notes, upcoming appointments, etc.  Non-urgent messages can be sent to your provider as well.   To learn more about what you can do with MyChart, go to [email or web address].

## 2024-06-30 ENCOUNTER — Ambulatory Visit: Payer: Self-pay | Admitting: Physician Assistant

## 2024-08-05 ENCOUNTER — Other Ambulatory Visit: Payer: Self-pay | Admitting: Cardiology

## 2024-08-06 ENCOUNTER — Ambulatory Visit (HOSPITAL_COMMUNITY)
Admission: RE | Admit: 2024-08-06 | Discharge: 2024-08-06 | Disposition: A | Discharge status: Home / Self Care | Source: Ambulatory Visit | Attending: Physician Assistant | Admitting: Physician Assistant

## 2024-08-06 DIAGNOSIS — I5020 Unspecified systolic (congestive) heart failure: Secondary | ICD-10-CM | POA: Insufficient documentation

## 2024-08-06 DIAGNOSIS — Z789 Other specified health status: Secondary | ICD-10-CM | POA: Diagnosis present

## 2024-08-06 DIAGNOSIS — E782 Mixed hyperlipidemia: Secondary | ICD-10-CM | POA: Diagnosis present

## 2024-08-06 DIAGNOSIS — I5022 Chronic systolic (congestive) heart failure: Secondary | ICD-10-CM | POA: Diagnosis present

## 2024-08-06 DIAGNOSIS — Z951 Presence of aortocoronary bypass graft: Secondary | ICD-10-CM | POA: Diagnosis present

## 2024-08-06 DIAGNOSIS — I2510 Atherosclerotic heart disease of native coronary artery without angina pectoris: Secondary | ICD-10-CM | POA: Diagnosis present

## 2024-08-06 LAB — ECHOCARDIOGRAM COMPLETE
Area-P 1/2: 3.54 cm2
S' Lateral: 2.8 cm

## 2025-05-27 ENCOUNTER — Inpatient Hospital Stay

## 2025-05-27 ENCOUNTER — Inpatient Hospital Stay: Admitting: Nurse Practitioner
